# Patient Record
Sex: Female | Born: 1990 | Hispanic: No | Smoking: Former smoker
Health system: Southern US, Community
[De-identification: ages and names within clinical notes are randomized; demographics above are authoritative.]

## PROBLEM LIST (undated history)

## (undated) ENCOUNTER — Emergency Department (HOSPITAL_COMMUNITY): Payer: PRIVATE HEALTH INSURANCE

## (undated) ENCOUNTER — Emergency Department (HOSPITAL_COMMUNITY): Admission: EM | Payer: PRIVATE HEALTH INSURANCE

## (undated) ENCOUNTER — Ambulatory Visit (HOSPITAL_COMMUNITY): Admission: EM | Payer: Self-pay | Source: Home / Self Care

## (undated) DIAGNOSIS — F419 Anxiety disorder, unspecified: Secondary | ICD-10-CM

## (undated) DIAGNOSIS — F32A Depression, unspecified: Secondary | ICD-10-CM

## (undated) DIAGNOSIS — R011 Cardiac murmur, unspecified: Secondary | ICD-10-CM

## (undated) DIAGNOSIS — F4310 Post-traumatic stress disorder, unspecified: Secondary | ICD-10-CM

## (undated) DIAGNOSIS — J45909 Unspecified asthma, uncomplicated: Secondary | ICD-10-CM

## (undated) DIAGNOSIS — Z8759 Personal history of other complications of pregnancy, childbirth and the puerperium: Secondary | ICD-10-CM

## (undated) DIAGNOSIS — F3111 Bipolar disorder, current episode manic without psychotic features, mild: Secondary | ICD-10-CM

## (undated) DIAGNOSIS — J3089 Other allergic rhinitis: Secondary | ICD-10-CM

## (undated) HISTORY — DX: Other allergic rhinitis: J30.89

## (undated) HISTORY — DX: Cardiac murmur, unspecified: R01.1

## (undated) HISTORY — PX: ADENOIDECTOMY: SUR15

## (undated) HISTORY — PX: TONSILECTOMY/ADENOIDECTOMY WITH MYRINGOTOMY: SHX6125

## (undated) HISTORY — DX: Personal history of other complications of pregnancy, childbirth and the puerperium: Z87.59

---

## 2013-09-07 HISTORY — PX: FOOT SURGERY: SHX648

## 2016-09-21 ENCOUNTER — Encounter (HOSPITAL_COMMUNITY): Payer: Self-pay

## 2016-09-21 ENCOUNTER — Emergency Department (HOSPITAL_COMMUNITY)
Admission: EM | Admit: 2016-09-21 | Discharge: 2016-09-22 | Disposition: A | Discharge status: Home / Self Care | Payer: Self-pay | Attending: Emergency Medicine | Admitting: Emergency Medicine

## 2016-09-21 DIAGNOSIS — F17200 Nicotine dependence, unspecified, uncomplicated: Secondary | ICD-10-CM | POA: Insufficient documentation

## 2016-09-21 DIAGNOSIS — J45909 Unspecified asthma, uncomplicated: Secondary | ICD-10-CM | POA: Insufficient documentation

## 2016-09-21 DIAGNOSIS — M62830 Muscle spasm of back: Secondary | ICD-10-CM | POA: Insufficient documentation

## 2016-09-21 HISTORY — DX: Unspecified asthma, uncomplicated: J45.909

## 2016-09-21 NOTE — ED Triage Notes (Signed)
Pt complaining of bilateral flank pain and muscle spasms since yesterday. Pt states pain radiates down R leg. Pt denies any injury or trauma.

## 2016-09-22 MED ORDER — CYCLOBENZAPRINE HCL 10 MG PO TABS
10.0000 mg | ORAL_TABLET | Freq: Once | ORAL | Status: AC
  Administered 2016-09-22: 10 mg via ORAL
  Filled 2016-09-22: qty 1

## 2016-09-22 MED ORDER — CYCLOBENZAPRINE HCL 10 MG PO TABS: 10.0000 mg | ORAL_TABLET | Freq: Two times a day (BID) | ORAL | 0 refills | Status: DC | PRN

## 2016-09-22 MED ORDER — IBUPROFEN 600 MG PO TABS: 600.0000 mg | ORAL_TABLET | Freq: Four times a day (QID) | ORAL | 0 refills | Status: DC | PRN

## 2016-09-22 MED ORDER — HYDROCODONE-ACETAMINOPHEN 5-325 MG PO TABS: 1.0000 | ORAL_TABLET | ORAL | 0 refills | Status: DC | PRN

## 2016-09-22 MED ORDER — HYDROCODONE-ACETAMINOPHEN 5-325 MG PO TABS
1.0000 | ORAL_TABLET | Freq: Once | ORAL | Status: AC
  Administered 2016-09-22: 1 via ORAL
  Filled 2016-09-22: qty 1

## 2016-09-22 NOTE — ED Provider Notes (Signed)
[facility]-EMERGENCY DEPT Provider Note   CSN: [id number] Arrival date & time: 09/21/16  2212     History   Chief Complaint Chief Complaint  Patient presents with  . Back Pain    HPI Brandy Ruiz is a 26 y.o. female.  Patient with complaint of back pain x 2 days. No known injury or strain. No history of back pain. It is located in bilateral lower back without midline pain. It is constant and suddenly sharp and grabbing with movement. No abdominal pain, nausea or vomiting. No urinary symptoms or fever.    The history is provided by the patient. No language interpreter was used.  Back Pain   Pertinent negatives include no fever, no abdominal pain, no dysuria and no weakness.    Past Medical History:  Diagnosis Date  . Asthma     There are no active problems to display for this patient.   History reviewed. No pertinent surgical history.  OB History    No data available       Home Medications    Prior to Admission medications   Not on File    Family History History reviewed. No pertinent family history.  Social History Social History  Substance Use Topics  . Smoking status: Current Every Day Smoker  . Smokeless tobacco: Never Used  . Alcohol use Yes     Allergies   Other and Penicillins   Review of Systems Review of Systems  Constitutional: Negative for chills and fever.  Gastrointestinal: Negative for abdominal pain and nausea.  Genitourinary: Negative for dysuria.  Musculoskeletal: Positive for back pain.  Neurological: Negative for weakness.     Physical Exam Updated Vital Signs BP 130/85   Pulse 85   Temp 97.9 F (36.6 C) (Oral)   Resp 16   LMP 09/14/2016 (Approximate)   SpO2 100%   Physical Exam  Constitutional: She is oriented to person, place, and time. She appears well-developed and well-nourished. No distress.  Neck: Normal range of motion.  Pulmonary/Chest: Effort normal.  Abdominal: There is no tenderness.    Musculoskeletal:  Bilateral paralumbar tenderness without palpable spasm. No midline tenderness. She is ambulatory and fully weight bearing.   Neurological: She is alert and oriented to person, place, and time.  Reflexes equal in lower extremities.   Skin: Skin is warm and dry. No erythema.     ED Treatments / Results  Labs (all labs ordered are listed, but only abnormal results are displayed) Labs Reviewed - No data to display  EKG  EKG Interpretation None       Radiology No results found.  Procedures Procedures (including critical care time)  Medications Ordered in ED Medications  HYDROcodone-acetaminophen (NORCO/VICODIN) 5-325 MG per tablet 1 tablet (not administered)  cyclobenzaprine (FLEXERIL) tablet 10 mg (not administered)     Initial Impression / Assessment and Plan / ED Course  I have reviewed the triage vital signs and the nursing notes.  Pertinent labs & imaging results that were available during my care of the patient were reviewed by me and considered in my medical decision making (see chart for details).  Clinical Course     Complains of bilateral back pain c/w muscular spasm. Will treat with rest, ibuprofen, flexeril. Return precautions discussed.   Final Clinical Impressions(s) / ED Diagnoses   Final diagnoses:  None   1. Muscle spasm  New Prescriptions New Prescriptions   No medications on file     Elpidio AnisShari Yassen [place]ett, PA-C 09/22/16 [id number]  Zadie Rhine, MD 09/22/16 [phone]

## 2017-06-21 ENCOUNTER — Emergency Department (HOSPITAL_COMMUNITY)
Admission: EM | Admit: 2017-06-21 | Discharge: 2017-06-21 | Discharge status: Against Medical Advice | Payer: Self-pay | Attending: Emergency Medicine | Admitting: Emergency Medicine

## 2017-06-21 DIAGNOSIS — Y281XXA Contact with knife, undetermined intent, initial encounter: Secondary | ICD-10-CM | POA: Insufficient documentation

## 2017-06-21 DIAGNOSIS — S41112A Laceration without foreign body of left upper arm, initial encounter: Secondary | ICD-10-CM

## 2017-06-21 DIAGNOSIS — Y929 Unspecified place or not applicable: Secondary | ICD-10-CM | POA: Insufficient documentation

## 2017-06-21 DIAGNOSIS — Y9389 Activity, other specified: Secondary | ICD-10-CM | POA: Insufficient documentation

## 2017-06-21 DIAGNOSIS — Y999 Unspecified external cause status: Secondary | ICD-10-CM | POA: Insufficient documentation

## 2017-06-21 DIAGNOSIS — S51812A Laceration without foreign body of left forearm, initial encounter: Secondary | ICD-10-CM | POA: Insufficient documentation

## 2017-06-21 DIAGNOSIS — F17200 Nicotine dependence, unspecified, uncomplicated: Secondary | ICD-10-CM | POA: Insufficient documentation

## 2017-06-21 DIAGNOSIS — Z5329 Procedure and treatment not carried out because of patient's decision for other reasons: Secondary | ICD-10-CM | POA: Insufficient documentation

## 2017-06-21 DIAGNOSIS — J45909 Unspecified asthma, uncomplicated: Secondary | ICD-10-CM | POA: Insufficient documentation

## 2017-06-21 LAB — COMPREHENSIVE METABOLIC PANEL
ALT: 27 U/L (ref 14–54)
AST: 33 U/L (ref 15–41)
Albumin: 4.7 g/dL (ref 3.5–5.0)
Alkaline Phosphatase: 68 U/L (ref 38–126)
Anion gap: 12 (ref 5–15)
BUN: 8 mg/dL (ref 6–20)
CHLORIDE: 111 mmol/L (ref 101–111)
CO2: 21 mmol/L — ABNORMAL LOW (ref 22–32)
Calcium: 9 mg/dL (ref 8.9–10.3)
Creatinine, Ser: 0.69 mg/dL (ref 0.44–1.00)
Glucose, Bld: 96 mg/dL (ref 65–99)
POTASSIUM: 3.7 mmol/L (ref 3.5–5.1)
Sodium: 144 mmol/L (ref 135–145)
Total Bilirubin: 0.3 mg/dL (ref 0.3–1.2)
Total Protein: 8.1 g/dL (ref 6.5–8.1)

## 2017-06-21 MED ORDER — LIDOCAINE-EPINEPHRINE (PF) 2 %-1:200000 IJ SOLN
INTRAMUSCULAR | Status: AC
  Administered 2017-06-21: 07:00:00
  Filled 2017-06-21: qty 20

## 2017-06-21 NOTE — ED Provider Notes (Signed)
[facility]-EMERGENCY DEPT Provider Note   CSN: [id number] Arrival date & time: 06/21/17  0610     History   Chief Complaint Chief Complaint  Patient presents with  . Suicidal  . Extremity Laceration    HPI Brandy Ruiz is a 26 y.o. female.  HPI   Evaluation of extremity laceration. Patient is accompanied by her fiance, [place]. They report about an hour before arrival, they were drinking alcohol, patient became upset about her current living situation, decreased finances and cut her left forearm with a kitchen knife. She denies any suicidal ideations. No auditory or visual hallucinations. She denies any other illicit drug use.no fevers, chills, chest pain or shortness of breath, abdominal pain, numbness or weakness. Nothing tried to improve symptoms. Nothing makes problem better or worse.ast tetanus unknown.  Past Medical History:  Diagnosis Date  . Asthma     There are no active problems to display for this patient.   No past surgical history on file.  OB History    No data available       Home Medications    Prior to Admission medications   Medication Sig Start Date End Date Taking? Authorizing Provider  cyclobenzaprine (FLEXERIL) 10 MG tablet Take 1 tablet (10 mg total) by mouth 2 (two) times daily as needed for muscle spasms. 09/22/16   Elpidio Anis, PA-C  HYDROcodone-acetaminophen (NORCO/VICODIN) 5-325 MG tablet Take 1 tablet by mouth every 4 (four) hours as needed. 09/22/16   Elpidio Anis, PA-C  ibuprofen (ADVIL,MOTRIN) 600 MG tablet Take 1 tablet (600 mg total) by mouth every 6 (six) hours as needed. 09/22/16   Elpidio Anis, PA-C    Family History No family history on file.  Social History Social History  Substance Use Topics  . Smoking status: Current Every Day Smoker  . Smokeless tobacco: Never Used  . Alcohol use Yes     Allergies   Amoxicillin; Other; and Penicillins   Review of Systems Review of Systems  see HPI  Physical Exam Updated  Vital Signs BP (!) 142/93 (BP Location: Left Arm)   Pulse 84   Temp 98.1 F (36.7 C) (Oral)   Resp 15   Ht  (1.651 m)   Wt 83.9 kg (185 lb)   SpO2 99%   BMI 30.79 kg/m   Physical Exam  Constitutional: She appears well-developed. No distress.  Awake, alert and nontoxic in appearance  HENT:  Head: Normocephalic and atraumatic.  Right Ear: External ear normal.  Left Ear: External ear normal.  Mouth/Throat: Oropharynx is clear and moist.  Eyes: Pupils are equal, round, and reactive to light. Conjunctivae and EOM are normal.  Neck: Normal range of motion. No JVD present.  Cardiovascular: Normal rate, regular rhythm and normal heart sounds.   Pulmonary/Chest: Effort normal and breath sounds normal. No stridor.  Abdominal: Soft. There is no tenderness.  Musculoskeletal: Normal range of motion.  Neurological:  Awake, alert, cooperative and aware of situation; motor strength bilaterally; sensation normal to light touch bilaterally; no facial asymmetry; tongue midline; major cranial nerves appear intact;  baseline gait without new ataxia.  Skin: No rash noted. She is not diaphoretic.  Linear laceration over the palmar aspect of  Mid left forearm, roughly 4 cm in length. Minimal adipose tissue exposed. Hemostasis achieved prior to arrival. Sensation intact to light touch distal to injury. Able to actively flex and extend all digits against resistance. Distal pulses intact with brisk cap refill.  Psychiatric: She has a normal mood and affect.  Her behavior is normal. Thought content normal.  Nursing note and vitals reviewed.  LACERATION REPAIR Performed by: Sharlene Motts Authorized by: Sharlene Motts Consent: Verbal consent obtained. Risks and benefits: risks, benefits and alternatives were discussed Consent given by: patient Patient identity confirmed: provided demographic data Prepped and Draped in normal sterile fashion Wound explored  Laceration Location: left  forearm  Laceration Length: 4cm  No Foreign Bodies seen or palpated  Anesthesia: local infiltration  Local anesthetic: lidocaine 2% with epinephrine  Anesthetic total: 5 ml  Irrigation method: syringe Amount of cleaning: standard  Skin closure: Ethilon 4-0, running suture  Number of sutures: 1  Technique: running  Patient tolerance: Patient tolerated the procedure well with no immediate complications.   ED Treatments / Results  Labs (all labs ordered are listed, but only abnormal results are displayed) Labs Reviewed  COMPREHENSIVE METABOLIC PANEL - Abnormal; Notable for the following:       Result Value   CO2 21 (*)    All other components within normal limits  ETHANOL  SALICYLATE LEVEL  ACETAMINOPHEN LEVEL  RAPID URINE DRUG SCREEN, HOSP PERFORMED  CBC  I-STAT BETA HCG BLOOD, ED ([facility], [facility], [facility] ONLY)    EKG  EKG Interpretation None       Radiology No results found.  Procedures Procedures (including critical care time)  Medications Ordered in ED Medications  lidocaine-EPINEPHrine (XYLOCAINE W/EPI) 2 %-1:200000 (PF) injection (  Given by Other 06/21/17 1610)     Initial Impression / Assessment and Plan / ED Course  I have reviewed the triage vital signs and the nursing notes.  Pertinent labs & imaging results that were available during my care of the patient were reviewed by me and considered in my medical decision making (see chart for details).     Patient is clinically sober at this time. She refuses any lab work , Tdap update or psych evaluation. Is going home with fianc, Milan. Discussed with her this would require her leaving against medical advise. She verbalizes understanding as does her fianc, they understand they may return to emergency department at any time for reevaluation. She agrees to return in 7 days to have sutures removed. She is overall well-appearing, no suicidal or homicidal ideations, hallucinations. I believe she is mentally  capable of understanding her current situation and accepting responsibility for her decision.  Final Clinical Impressions(s) / ED Diagnoses   Final diagnoses:  Laceration of left upper extremity, initial encounter    New Prescriptions Discharge Medication List as of 06/21/2017  7:33 AM       Joycie Peek, PA-C 06/21/17 0930    Long, Arlyss Repress, MD 06/22/17 [phone]

## 2017-06-21 NOTE — ED Notes (Signed)
Patient left AMA and did not want any instructions or information on follow up or returning if needed.

## 2017-06-21 NOTE — ED Notes (Addendum)
This writer attempt to collect labs PT became upset state she does not know why she is here. PT request to leave state she is not SI. She does not need to be here. When ask about what test will be running. RN info PT of all test and reason for each test. PT unhappy currently not IVC PA have been made awre

## 2017-06-21 NOTE — ED Triage Notes (Signed)
Patient arrives by EMS with complaints of suicide attempt by cutting left forearm/wrist area with a knife. Friends called 911. EMS states she has been drinking tonight/stress in life-stated to EMS that she did not want to kill herself, she just wanted to hurt herself. Laceration-3 cm lac with soft tissue showing-bleeding on scene and fire bandaged patient.

## 2017-06-21 NOTE — ED Notes (Signed)
Bed: RESB Expected date:  Expected time:  Means of arrival:  Comments: EMS SI/cut wrists

## 2017-06-21 NOTE — ED Notes (Signed)
Patient becoming more verbally abusive and stating that she wants to go home.  Consulted [employer] on IVC vs AMA.  Romeo Apple stated that he was not going to IVC her and the patient left AMA.  Patient was alert and oriented x4 and showing no signs of distress.

## 2017-06-27 ENCOUNTER — Encounter (HOSPITAL_COMMUNITY): Payer: Self-pay

## 2017-06-27 ENCOUNTER — Emergency Department (HOSPITAL_COMMUNITY)
Admission: EM | Admit: 2017-06-27 | Discharge: 2017-06-27 | Disposition: A | Discharge status: Home / Self Care | Payer: Self-pay | Attending: Emergency Medicine | Admitting: Emergency Medicine

## 2017-06-27 DIAGNOSIS — F17210 Nicotine dependence, cigarettes, uncomplicated: Secondary | ICD-10-CM | POA: Insufficient documentation

## 2017-06-27 DIAGNOSIS — Z4802 Encounter for removal of sutures: Secondary | ICD-10-CM | POA: Insufficient documentation

## 2017-06-27 DIAGNOSIS — Z79899 Other long term (current) drug therapy: Secondary | ICD-10-CM | POA: Insufficient documentation

## 2017-06-27 DIAGNOSIS — J45909 Unspecified asthma, uncomplicated: Secondary | ICD-10-CM | POA: Insufficient documentation

## 2017-06-27 NOTE — ED Triage Notes (Signed)
Pt requesting suture removal on her L forearm. Area does not appear infected. Denies bleeding or drainage. A&Ox4.

## 2017-06-27 NOTE — ED Provider Notes (Signed)
  [facility]-EMERGENCY DEPT Provider Note   CSN: [id number] Arrival date & time: 06/27/17  1747     History   Chief Complaint Chief Complaint  Patient presents with  . Suture / Staple Removal    HPI Brandy Ruiz is a 26 y.o. female who presents to the ED for suture removal. The sutures have been in place one week. The patient denies any problems with the suture site.   HPI  Past Medical History:  Diagnosis Date  . Asthma     There are no active problems to display for this patient.   History reviewed. No pertinent surgical history.  OB History    No data available       Home Medications    Prior to Admission medications   Medication Sig Start Date End Date Taking? Authorizing Provider  cyclobenzaprine (FLEXERIL) 10 MG tablet Take 1 tablet (10 mg total) by mouth 2 (two) times daily as needed for muscle spasms. 09/22/16   Elpidio AnisUpstill, Shari, PA-C  HYDROcodone-acetaminophen (NORCO/VICODIN) 5-325 MG tablet Take 1 tablet by mouth every 4 (four) hours as needed. 09/22/16   Elpidio AnisUpstill, Shari, PA-C  ibuprofen (ADVIL,MOTRIN) 600 MG tablet Take 1 tablet (600 mg total) by mouth every 6 (six) hours as needed. 09/22/16   Elpidio AnisUpstill, Shari, PA-C    Family History History reviewed. No pertinent family history.  Social History Social History  Substance Use Topics  . Smoking status: Current Every Day Smoker  . Smokeless tobacco: Never Used  . Alcohol use Yes     Allergies   Amoxicillin; Other; and Penicillins   Review of Systems Review of Systems  Musculoskeletal: Negative for joint swelling.  Skin: Positive for wound.     Physical Exam Updated Vital Signs BP (!) 144/92 (BP Location: Right Arm)   Pulse 100   Temp 98.1 F (36.7 C) (Oral)   Resp 18   SpO2 100%   Physical Exam  Constitutional: She appears well-developed and well-nourished. No distress.  HENT:  Head: Normocephalic.  Eyes: EOM are normal.  Neck: Neck supple.  Cardiovascular:  Normal rate.   Pulmonary/Chest: Effort normal.  Musculoskeletal: Normal range of motion.  Sutures in place left forearm no erythema, no drainage, no signs of infection.   Neurological: She is alert.  Skin: Skin is warm and dry.  Nursing note and vitals reviewed.    ED Treatments / Results  Labs (all labs ordered are listed, but only abnormal results are displayed) Labs Reviewed - No data to display   Radiology No results found.  Procedures Procedures (including critical care time)  Medications Ordered in ED Medications - No data to display  Sutures removed by RN.  Initial Impression / Assessment and Plan / ED Course  I have reviewed the triage vital signs and the nursing notes. 26 y.o. female here for suture removal without signs of infection. No other complaints.   Final Clinical Impressions(s) / ED Diagnoses   Final diagnoses:  Visit for suture removal    New Prescriptions New Prescriptions   No medications on file     Janne Napoleoneese, Hope M, NP 06/27/17 1849    Rolland PorterJames, Mark, MD 07/16/17 2252

## 2018-01-10 ENCOUNTER — Encounter (HOSPITAL_COMMUNITY): Payer: Self-pay | Admitting: *Deleted

## 2018-01-10 ENCOUNTER — Emergency Department (HOSPITAL_COMMUNITY)
Admission: EM | Admit: 2018-01-10 | Discharge: 2018-01-10 | Disposition: A | Discharge status: Home / Self Care | Payer: Self-pay | Attending: Emergency Medicine | Admitting: Emergency Medicine

## 2018-01-10 DIAGNOSIS — J4521 Mild intermittent asthma with (acute) exacerbation: Secondary | ICD-10-CM | POA: Insufficient documentation

## 2018-01-10 DIAGNOSIS — F17210 Nicotine dependence, cigarettes, uncomplicated: Secondary | ICD-10-CM | POA: Insufficient documentation

## 2018-01-10 DIAGNOSIS — Z79899 Other long term (current) drug therapy: Secondary | ICD-10-CM | POA: Insufficient documentation

## 2018-01-10 MED ORDER — ALBUTEROL SULFATE (2.5 MG/3ML) 0.083% IN NEBU
2.5000 mg | INHALATION_SOLUTION | Freq: Once | RESPIRATORY_TRACT | Status: AC
  Administered 2018-01-10: 2.5 mg via RESPIRATORY_TRACT
  Filled 2018-01-10: qty 3

## 2018-01-10 MED ORDER — PREDNISONE 20 MG PO TABS: ORAL_TABLET | ORAL | 0 refills | Status: DC

## 2018-01-10 MED ORDER — ALBUTEROL SULFATE (2.5 MG/3ML) 0.083% IN NEBU
5.0000 mg | INHALATION_SOLUTION | Freq: Once | RESPIRATORY_TRACT | Status: AC
  Administered 2018-01-10: 5 mg via RESPIRATORY_TRACT
  Filled 2018-01-10: qty 6

## 2018-01-10 MED ORDER — LORATADINE 10 MG PO TABS
10.0000 mg | ORAL_TABLET | Freq: Once | ORAL | Status: AC
  Administered 2018-01-10: 10 mg via ORAL
  Filled 2018-01-10: qty 1

## 2018-01-10 MED ORDER — PREDNISONE 20 MG PO TABS
60.0000 mg | ORAL_TABLET | Freq: Once | ORAL | Status: AC
  Administered 2018-01-10: 60 mg via ORAL
  Filled 2018-01-10: qty 3

## 2018-01-10 MED ORDER — BENZONATATE 100 MG PO CAPS: 100.0000 mg | ORAL_CAPSULE | Freq: Three times a day (TID) | ORAL | 0 refills | Status: DC

## 2018-01-10 MED ORDER — LORATADINE 10 MG PO TABS: 10.0000 mg | ORAL_TABLET | Freq: Every day | ORAL | 0 refills | Status: DC

## 2018-01-10 NOTE — ED Provider Notes (Signed)
[facility]-EMERGENCY DEPT Provider Note   CSN: [id number] Arrival date & time: 01/10/18  0128     History   Chief Complaint Chief Complaint  Patient presents with  . Wheezing    HPI Brandy Ruiz is a 27 y.o. female.  The history is provided by the patient.  Wheezing   This is a recurrent problem. The current episode started 2 days ago. The problem occurs constantly. The problem has not changed since onset.Associated symptoms include cough. Pertinent negatives include no chest pain, no fever, no abdominal pain, no vomiting, no diarrhea, no dysuria, no coryza, no ear pain, no headaches, no rhinorrhea, no sore throat, no swollen glands, no neck pain, no hemoptysis, no sputum production and no rash. There was no precipitant for this problem. She has tried nothing for the symptoms. The treatment provided no relief. She has had no prior hospitalizations. She has had no prior ICU admissions. Her past medical history is significant for asthma.    Past Medical History:  Diagnosis Date  . Asthma     There are no active problems to display for this patient.   History reviewed. No pertinent surgical history.   OB History   None      Home Medications    Prior to Admission medications   Medication Sig Start Date End Date Taking? Authorizing Provider  cyclobenzaprine (FLEXERIL) 10 MG tablet Take 1 tablet (10 mg total) by mouth 2 (two) times daily as needed for muscle spasms. 09/22/16   Elpidio Anis, PA-C  HYDROcodone-acetaminophen (NORCO/VICODIN) 5-325 MG tablet Take 1 tablet by mouth every 4 (four) hours as needed. 09/22/16   Elpidio Anis, PA-C  ibuprofen (ADVIL,MOTRIN) 600 MG tablet Take 1 tablet (600 mg total) by mouth every 6 (six) hours as needed. 09/22/16   Elpidio Anis, PA-C    Family History No family history on file.  Social History Social History   Tobacco Use  . Smoking status: Current Every Day Smoker  . Smokeless tobacco: Never Used    Substance Use Topics  . Alcohol use: Yes  . Drug use: No     Allergies   Amoxicillin; Other; and Penicillins   Review of Systems Review of Systems  Constitutional: Negative for fever.  HENT: Negative for ear pain, rhinorrhea and sore throat.   Respiratory: Positive for cough and wheezing. Negative for hemoptysis, sputum production and shortness of breath.   Cardiovascular: Negative for chest pain, palpitations and leg swelling.  Gastrointestinal: Negative for abdominal pain, diarrhea and vomiting.  Genitourinary: Negative for dysuria.  Musculoskeletal: Negative for neck pain.  Skin: Negative for rash.  Neurological: Negative for headaches.  All other systems reviewed and are negative.    Physical Exam Updated Vital Signs BP 138/75 (BP Location: Left Arm)   Pulse 97   Temp 98.9 F (37.2 C) (Oral)   Resp 18   Ht  (1.651 m)   Wt 83.9 kg (185 lb)   SpO2 98%   BMI 30.79 kg/m   Physical Exam  Constitutional: She is oriented to person, place, and time. She appears well-developed and well-nourished. No distress.  HENT:  Head: Normocephalic and atraumatic.  Mouth/Throat: No oropharyngeal exudate.  Eyes: Pupils are equal, round, and reactive to light. Conjunctivae are normal.  Neck: Normal range of motion. Neck supple.  Cardiovascular: Normal rate, regular rhythm, normal heart sounds and intact distal pulses.  Pulmonary/Chest: No stridor. No respiratory distress. She has wheezes. She has no rales.  Abdominal: Soft. Bowel sounds  are normal. She exhibits no mass. There is no tenderness. There is no rebound and no guarding.  Musculoskeletal: Normal range of motion. She exhibits no edema or tenderness.  Neurological: She is alert and oriented to person, place, and time. She displays normal reflexes.  Skin: Skin is warm and dry. Capillary refill takes less than 2 seconds.  Psychiatric: She has a normal mood and affect.  Nursing note and vitals reviewed.    ED  Treatments / Results  Labs (all labs ordered are listed, but only abnormal results are displayed) Labs Reviewed - No data to display  EKG None  Radiology No results found.  Procedures Procedures (including critical care time)  Medications Ordered in ED Medications  albuterol (PROVENTIL) (2.5 MG/3ML) 0.083% nebulizer solution 5 mg (5 mg Nebulization Given 01/10/18 0142)  albuterol (PROVENTIL) (2.5 MG/3ML) 0.083% nebulizer solution 2.5 mg (2.5 mg Nebulization Given 01/10/18 0344)  loratadine (CLARITIN) tablet 10 mg (10 mg Oral Given 01/10/18 0344)  predniSONE (DELTASONE) tablet 60 mg (60 mg Oral Given 01/10/18 0344)   Improved post medication   Final Clinical Impressions(s) / ED Diagnoses   Return for weakness, numbness, changes in vision or speech, fevers >100.4 unrelieved by medication, shortness of breath, intractable vomiting, or diarrhea, abdominal pain, Inability to tolerate liquids or food, cough, altered mental status or any concerns. No signs of systemic illness or infection. The patient is nontoxic-appearing on exam and vital signs are within normal limits.   I have reviewed the triage vital signs and the nursing notes. Pertinent labs &imaging results that were available during my care of the patient were reviewed by me and considered in my medical decision making (see chart for details).  After history, exam, and medical workup I feel the patient has been appropriately medically screened and is safe for discharge home. Pertinent diagnoses were discussed with the patient. Patient was given return precautions.    Yolunda Kloos, MD 01/10/18 0981

## 2018-01-10 NOTE — ED Triage Notes (Signed)
Pt stated "My neb or inhaler is not working.  I'm wheezing.  I used my neb last @ 2230.  Now I'm coughing and can't quit."

## 2018-01-10 NOTE — ED Notes (Signed)
Pt placed on continuous pulse ox

## 2019-08-29 DIAGNOSIS — M9905 Segmental and somatic dysfunction of pelvic region: Secondary | ICD-10-CM | POA: Diagnosis not present

## 2019-08-29 DIAGNOSIS — M9903 Segmental and somatic dysfunction of lumbar region: Secondary | ICD-10-CM | POA: Diagnosis not present

## 2019-08-29 DIAGNOSIS — M62830 Muscle spasm of back: Secondary | ICD-10-CM | POA: Diagnosis not present

## 2019-08-29 DIAGNOSIS — M9902 Segmental and somatic dysfunction of thoracic region: Secondary | ICD-10-CM | POA: Diagnosis not present

## 2019-09-25 DIAGNOSIS — M9902 Segmental and somatic dysfunction of thoracic region: Secondary | ICD-10-CM | POA: Diagnosis not present

## 2019-09-25 DIAGNOSIS — M9905 Segmental and somatic dysfunction of pelvic region: Secondary | ICD-10-CM | POA: Diagnosis not present

## 2019-09-25 DIAGNOSIS — M62830 Muscle spasm of back: Secondary | ICD-10-CM | POA: Diagnosis not present

## 2019-09-25 DIAGNOSIS — M9903 Segmental and somatic dysfunction of lumbar region: Secondary | ICD-10-CM | POA: Diagnosis not present

## 2019-09-28 DIAGNOSIS — M9902 Segmental and somatic dysfunction of thoracic region: Secondary | ICD-10-CM | POA: Diagnosis not present

## 2019-09-28 DIAGNOSIS — M62830 Muscle spasm of back: Secondary | ICD-10-CM | POA: Diagnosis not present

## 2019-09-28 DIAGNOSIS — M9903 Segmental and somatic dysfunction of lumbar region: Secondary | ICD-10-CM | POA: Diagnosis not present

## 2019-09-28 DIAGNOSIS — M9905 Segmental and somatic dysfunction of pelvic region: Secondary | ICD-10-CM | POA: Diagnosis not present

## 2019-10-02 DIAGNOSIS — M62830 Muscle spasm of back: Secondary | ICD-10-CM | POA: Diagnosis not present

## 2019-10-02 DIAGNOSIS — M9903 Segmental and somatic dysfunction of lumbar region: Secondary | ICD-10-CM | POA: Diagnosis not present

## 2019-10-02 DIAGNOSIS — M9902 Segmental and somatic dysfunction of thoracic region: Secondary | ICD-10-CM | POA: Diagnosis not present

## 2019-10-02 DIAGNOSIS — M9905 Segmental and somatic dysfunction of pelvic region: Secondary | ICD-10-CM | POA: Diagnosis not present

## 2019-10-12 DIAGNOSIS — Z91013 Allergy to seafood: Secondary | ICD-10-CM | POA: Diagnosis not present

## 2019-10-12 DIAGNOSIS — J301 Allergic rhinitis due to pollen: Secondary | ICD-10-CM | POA: Diagnosis not present

## 2019-10-12 DIAGNOSIS — J45909 Unspecified asthma, uncomplicated: Secondary | ICD-10-CM | POA: Diagnosis not present

## 2019-10-12 DIAGNOSIS — J309 Allergic rhinitis, unspecified: Secondary | ICD-10-CM | POA: Diagnosis not present

## 2019-10-19 DIAGNOSIS — F314 Bipolar disorder, current episode depressed, severe, without psychotic features: Secondary | ICD-10-CM | POA: Diagnosis not present

## 2019-11-09 DIAGNOSIS — J309 Allergic rhinitis, unspecified: Secondary | ICD-10-CM | POA: Diagnosis not present

## 2019-11-09 DIAGNOSIS — Z91018 Allergy to other foods: Secondary | ICD-10-CM | POA: Diagnosis not present

## 2019-11-09 DIAGNOSIS — J45909 Unspecified asthma, uncomplicated: Secondary | ICD-10-CM | POA: Diagnosis not present

## 2019-11-13 DIAGNOSIS — Z0184 Encounter for antibody response examination: Secondary | ICD-10-CM | POA: Diagnosis not present

## 2019-11-13 DIAGNOSIS — Z0000 Encounter for general adult medical examination without abnormal findings: Secondary | ICD-10-CM | POA: Diagnosis not present

## 2019-11-16 DIAGNOSIS — G4700 Insomnia, unspecified: Secondary | ICD-10-CM | POA: Diagnosis not present

## 2019-11-16 DIAGNOSIS — Z6839 Body mass index (BMI) 39.0-39.9, adult: Secondary | ICD-10-CM | POA: Diagnosis not present

## 2019-11-16 DIAGNOSIS — Z23 Encounter for immunization: Secondary | ICD-10-CM | POA: Diagnosis not present

## 2019-11-16 DIAGNOSIS — Z0000 Encounter for general adult medical examination without abnormal findings: Secondary | ICD-10-CM | POA: Diagnosis not present

## 2019-11-16 DIAGNOSIS — N926 Irregular menstruation, unspecified: Secondary | ICD-10-CM | POA: Diagnosis not present

## 2019-11-16 DIAGNOSIS — Z01419 Encounter for gynecological examination (general) (routine) without abnormal findings: Secondary | ICD-10-CM | POA: Diagnosis not present

## 2019-11-16 DIAGNOSIS — R5383 Other fatigue: Secondary | ICD-10-CM | POA: Diagnosis not present

## 2019-11-16 DIAGNOSIS — F314 Bipolar disorder, current episode depressed, severe, without psychotic features: Secondary | ICD-10-CM | POA: Diagnosis not present

## 2019-11-16 DIAGNOSIS — R946 Abnormal results of thyroid function studies: Secondary | ICD-10-CM | POA: Diagnosis not present

## 2019-12-12 DIAGNOSIS — F314 Bipolar disorder, current episode depressed, severe, without psychotic features: Secondary | ICD-10-CM | POA: Diagnosis not present

## 2019-12-19 DIAGNOSIS — R946 Abnormal results of thyroid function studies: Secondary | ICD-10-CM | POA: Diagnosis not present

## 2019-12-20 DIAGNOSIS — E039 Hypothyroidism, unspecified: Secondary | ICD-10-CM | POA: Diagnosis not present

## 2019-12-20 DIAGNOSIS — N926 Irregular menstruation, unspecified: Secondary | ICD-10-CM | POA: Diagnosis not present

## 2019-12-20 DIAGNOSIS — R946 Abnormal results of thyroid function studies: Secondary | ICD-10-CM | POA: Diagnosis not present

## 2019-12-20 DIAGNOSIS — R5383 Other fatigue: Secondary | ICD-10-CM | POA: Diagnosis not present

## 2020-01-09 DIAGNOSIS — F314 Bipolar disorder, current episode depressed, severe, without psychotic features: Secondary | ICD-10-CM | POA: Diagnosis not present

## 2020-02-01 DIAGNOSIS — F902 Attention-deficit hyperactivity disorder, combined type: Secondary | ICD-10-CM | POA: Diagnosis not present

## 2020-02-15 DIAGNOSIS — F902 Attention-deficit hyperactivity disorder, combined type: Secondary | ICD-10-CM | POA: Diagnosis not present

## 2020-02-21 DIAGNOSIS — M9902 Segmental and somatic dysfunction of thoracic region: Secondary | ICD-10-CM | POA: Diagnosis not present

## 2020-02-21 DIAGNOSIS — M9903 Segmental and somatic dysfunction of lumbar region: Secondary | ICD-10-CM | POA: Diagnosis not present

## 2020-02-21 DIAGNOSIS — M9905 Segmental and somatic dysfunction of pelvic region: Secondary | ICD-10-CM | POA: Diagnosis not present

## 2020-02-21 DIAGNOSIS — M62830 Muscle spasm of back: Secondary | ICD-10-CM | POA: Diagnosis not present

## 2020-02-29 DIAGNOSIS — F902 Attention-deficit hyperactivity disorder, combined type: Secondary | ICD-10-CM | POA: Diagnosis not present

## 2020-03-05 DIAGNOSIS — F902 Attention-deficit hyperactivity disorder, combined type: Secondary | ICD-10-CM | POA: Diagnosis not present

## 2020-03-07 DIAGNOSIS — F314 Bipolar disorder, current episode depressed, severe, without psychotic features: Secondary | ICD-10-CM | POA: Diagnosis not present

## 2020-03-07 DIAGNOSIS — F902 Attention-deficit hyperactivity disorder, combined type: Secondary | ICD-10-CM | POA: Diagnosis not present

## 2020-03-20 DIAGNOSIS — F902 Attention-deficit hyperactivity disorder, combined type: Secondary | ICD-10-CM | POA: Diagnosis not present

## 2020-04-24 DIAGNOSIS — F902 Attention-deficit hyperactivity disorder, combined type: Secondary | ICD-10-CM | POA: Diagnosis not present

## 2020-05-08 DIAGNOSIS — F902 Attention-deficit hyperactivity disorder, combined type: Secondary | ICD-10-CM | POA: Diagnosis not present

## 2020-05-23 DIAGNOSIS — Z23 Encounter for immunization: Secondary | ICD-10-CM | POA: Diagnosis not present

## 2020-05-31 DIAGNOSIS — E039 Hypothyroidism, unspecified: Secondary | ICD-10-CM | POA: Diagnosis not present

## 2020-06-04 DIAGNOSIS — F331 Major depressive disorder, recurrent, moderate: Secondary | ICD-10-CM | POA: Diagnosis not present

## 2020-06-04 DIAGNOSIS — F411 Generalized anxiety disorder: Secondary | ICD-10-CM | POA: Diagnosis not present

## 2020-06-04 DIAGNOSIS — E039 Hypothyroidism, unspecified: Secondary | ICD-10-CM | POA: Diagnosis not present

## 2020-06-04 DIAGNOSIS — J4540 Moderate persistent asthma, uncomplicated: Secondary | ICD-10-CM | POA: Diagnosis not present

## 2020-06-21 DIAGNOSIS — N926 Irregular menstruation, unspecified: Secondary | ICD-10-CM | POA: Diagnosis not present

## 2020-08-06 ENCOUNTER — Inpatient Hospital Stay (HOSPITAL_COMMUNITY): Payer: Commercial Managed Care - PPO

## 2020-08-06 ENCOUNTER — Encounter (HOSPITAL_COMMUNITY): Payer: Self-pay | Admitting: Obstetrics and Gynecology

## 2020-08-06 ENCOUNTER — Inpatient Hospital Stay (HOSPITAL_COMMUNITY)
Admission: AD | Admit: 2020-08-06 | Discharge: 2020-08-06 | Disposition: A | Discharge status: Home / Self Care | Payer: Commercial Managed Care - PPO | Attending: Obstetrics & Gynecology | Admitting: Obstetrics & Gynecology

## 2020-08-06 DIAGNOSIS — O4690 Antepartum hemorrhage, unspecified, unspecified trimester: Secondary | ICD-10-CM

## 2020-08-06 DIAGNOSIS — Z6791 Unspecified blood type, Rh negative: Secondary | ICD-10-CM | POA: Insufficient documentation

## 2020-08-06 DIAGNOSIS — R109 Unspecified abdominal pain: Secondary | ICD-10-CM | POA: Insufficient documentation

## 2020-08-06 DIAGNOSIS — F17200 Nicotine dependence, unspecified, uncomplicated: Secondary | ICD-10-CM | POA: Diagnosis not present

## 2020-08-06 DIAGNOSIS — O039 Complete or unspecified spontaneous abortion without complication: Secondary | ICD-10-CM | POA: Insufficient documentation

## 2020-08-06 DIAGNOSIS — Z3A12 12 weeks gestation of pregnancy: Secondary | ICD-10-CM | POA: Diagnosis not present

## 2020-08-06 DIAGNOSIS — O26891 Other specified pregnancy related conditions, first trimester: Secondary | ICD-10-CM | POA: Insufficient documentation

## 2020-08-06 LAB — COMPREHENSIVE METABOLIC PANEL
ALT: 19 U/L (ref 0–44)
AST: 20 U/L (ref 15–41)
Albumin: 4.1 g/dL (ref 3.5–5.0)
Alkaline Phosphatase: 50 U/L (ref 38–126)
Anion gap: 13 (ref 5–15)
BUN: 9 mg/dL (ref 6–20)
CO2: 19 mmol/L — ABNORMAL LOW (ref 22–32)
Calcium: 9.2 mg/dL (ref 8.9–10.3)
Chloride: 104 mmol/L (ref 98–111)
Creatinine, Ser: 0.75 mg/dL (ref 0.44–1.00)
GFR, Estimated: >60 mL/min (ref >60)
Glucose, Bld: 122 mg/dL — ABNORMAL HIGH (ref 70–99)
Potassium: 3.6 mmol/L (ref 3.5–5.1)
Sodium: 136 mmol/L (ref 135–145)
Total Bilirubin: 0.4 mg/dL (ref 0.3–1.2)
Total Protein: 7.2 g/dL (ref 6.5–8.1)

## 2020-08-06 LAB — HCG, QUANTITATIVE, PREGNANCY: hCG, Beta Chain, Quant, S: 4263 m[IU]/mL — ABNORMAL HIGH (ref <5)

## 2020-08-06 LAB — WET PREP, GENITAL
Clue Cells Wet Prep HPF POC: NONE SEEN
Sperm: NONE SEEN
Trich, Wet Prep: NONE SEEN
Yeast Wet Prep HPF POC: NONE SEEN

## 2020-08-06 LAB — CBC
HCT: 41.3 % (ref 36.0–46.0)
Hemoglobin: 13.6 g/dL (ref 12.0–15.0)
MCH: 27.5 pg (ref 26.0–34.0)
MCHC: 32.9 g/dL (ref 30.0–36.0)
MCV: 83.6 fL (ref 80.0–100.0)
Platelets: 203 10*3/uL (ref 150–400)
RBC: 4.94 MIL/uL (ref 3.87–5.11)
RDW: 12.9 % (ref 11.5–15.5)
WBC: 15.4 10*3/uL — ABNORMAL HIGH (ref 4.0–10.5)
nRBC: 0 % (ref 0.0–0.2)

## 2020-08-06 LAB — ABO/RH: ABO/RH(D): O NEG

## 2020-08-06 MED ORDER — RHO D IMMUNE GLOBULIN 1500 UNIT/2ML IJ SOSY
300.0000 ug | PREFILLED_SYRINGE | Freq: Once | INTRAMUSCULAR | Status: AC
  Administered 2020-08-06: 300 ug via INTRAMUSCULAR
  Filled 2020-08-06: qty 2

## 2020-08-06 NOTE — Discharge Instructions (Signed)
Miscarriage A miscarriage is the loss of an unborn baby (fetus) before the 20th week of pregnancy. Most miscarriages happen during the first 3 months of pregnancy. Sometimes, a miscarriage can happen before a woman knows that she is pregnant. Having a miscarriage can be an emotional experience. If you have had a miscarriage, talk with your health care provider about any questions you may have about miscarrying, the grieving process, and your plans for future pregnancy. What are the causes? A miscarriage may be caused by:  Problems with the genes or chromosomes of the fetus. These problems make it impossible for the baby to develop normally. They are often the result of random errors that occur early in the development of the baby, and are not passed from parent to child (not inherited).  Infection of the cervix or uterus.  Conditions that affect hormone balance in the body.  Problems with the cervix, such as the cervix opening and thinning before pregnancy is at term (cervical insufficiency).  Problems with the uterus. These may include: ? A uterus with an abnormal shape. ? Fibroids in the uterus. ? Congenital abnormalities. These are problems that were present at birth.  Certain medical conditions.  Smoking, drinking alcohol, or using drugs.  Injury (trauma). In many cases, the cause of a miscarriage is not known. What are the signs or symptoms? Symptoms of this condition include:  Vaginal bleeding or spotting, with or without cramps or pain.  Pain or cramping in the abdomen or lower back.  Passing fluid, tissue, or blood clots from the vagina. How is this diagnosed? This condition may be diagnosed based on:  A physical exam.  Ultrasound.  Blood tests.  Urine tests. How is this treated? Treatment for a miscarriage is sometimes not necessary if you naturally pass all the tissue that was in your uterus. If necessary, this condition may be treated with:  Dilation and  curettage (D&C). This is a procedure in which the cervix is stretched open and the lining of the uterus (endometrium) is scraped. This is done only if tissue from the fetus or placenta remains in the body (incomplete miscarriage).  Medicines, such as: ? Antibiotic medicine, to treat infection. ? Medicine to help the body pass any remaining tissue. ? Medicine to reduce (contract) the size of the uterus. These medicines may be given if you have a lot of bleeding. If you have Rh negative blood and your baby was Rh positive, you will need a shot of a medicine called Rh immunoglobulinto protect your future babies from Rh blood problems. "Rh-negative" and "Rh-positive" refer to whether or not the blood has a specific protein found on the surface of red blood cells (Rh factor). Follow these instructions at home: Medicines   Take over-the-counter and prescription medicines only as told by your health care provider.  If you were prescribed antibiotic medicine, take it as told by your health care provider. Do not stop taking the antibiotic even if you start to feel better.  Do not take NSAIDs, such as aspirin and ibuprofen, unless they are approved by your health care provider. These medicines can cause bleeding. Activity  Rest as directed. Ask your health care provider what activities are safe for you.  Have someone help with home and family responsibilities during this time. General instructions  Keep track of the number of sanitary pads you use each day and how soaked (saturated) they are. Write down this information.  Monitor the amount of tissue or blood clots that   you pass from your vagina. Save any large amounts of tissue for your health care provider to examine.  Do not use tampons, douche, or have sex until your health care provider approves.  To help you and your partner with the process of grieving, talk with your health care provider or seek counseling.  When you are ready, meet with  your health care provider to discuss any important steps you should take for your health. Also, discuss steps you should take to have a healthy pregnancy in the future.  Keep all follow-up visits as told by your health care provider. This is important. Where to find more information  The American Congress of Obstetricians and Gynecologists: www.acog.org  [place] Department of Health and Programmer, systems of Women's Health: [place] Contact a health care provider if:  You have a fever or chills.  You have a foul smelling vaginal discharge.  You have more bleeding instead of less. Get help right away if:  You have severe cramps or pain in your back or abdomen.  You pass blood clots or tissue from your vagina that is walnut-sized or larger.  You soak more than 1 regular sanitary pad in an hour.  You become light-headed or weak.  You pass out.  You have feelings of sadness that take over your thoughts, or you have thoughts of hurting yourself. Summary  Most miscarriages happen in the first 3 months of pregnancy. Sometimes miscarriage happens before a woman even knows that she is pregnant.  Follow your health care provider's instruction for home care. Keep all follow-up appointments.  To help you and your partner with the process of grieving, talk with your health care provider or seek counseling. This information is not intended to replace advice given to you by your health care provider. Make sure you discuss any questions you have with your health care provider. Document Revised: 12/16/2018 Document Reviewed: 09/29/2016 Elsevier Patient Education  [place] Pregnancy Loss Pregnancy loss can happen any time during a pregnancy. Often the cause is not known. It is rarely because of anything you did. Pregnancy loss in early pregnancy (during the first trimester) is called a miscarriage. This type of pregnancy loss is the most common.  Pregnancy loss that happens after 20 weeks of pregnancy is called fetal demise if the baby's heart stops beating before birth. Fetal demise is much less common. Some women experience spontaneous labor shortly after fetal demise resulting in a stillborn birth (stillbirth). Any pregnancy loss can be devastating. You will need to recover both physically and emotionally. Most women are able to get pregnant again after a pregnancy loss and deliver a healthy baby. How to manage emotional recovery  Pregnancy loss is very hard emotionally. You may feel many different emotions while you grieve. You may feel sad and angry. You may also feel guilty. It is normal to have periods of crying. Emotional recovery can take longer than physical recovery. It is different for everyone. Taking these steps can help you in managing this loss:  Remember that it is unlikely you did anything to cause the pregnancy loss.  Share your thoughts and feelings with friends, family, and your partner. Remember that your partner is also recovering emotionally.  Make sure you have a good support system. Do not spend too much time alone.  Meet with a pregnancy loss counselor or join a pregnancy loss support group.  Get enough sleep and eat a  healthy diet. Return to regular exercise when you have recovered physically.  Do not use drugs or alcohol to manage your emotions.  Consider seeing a mental health professional to help you recover emotionally.  Ask a friend or loved one to help you decide what to do with any clothing and nursery items you received for your baby. In the case of a stillbirth, many women benefit from taking additional steps in the grieving process. You may want to:  Hold your baby after the birth.  Name your baby.  Request a birth certificate.  Create a keepsake such as handprints or footprints.  Dress your baby and have a picture taken.  Make funeral arrangements.  Ask for a baptism or  blessing. Hospitals have staff members who can help you with all these arrangements. How to recognize emotional stress It is normal to have emotional stress after a pregnancy loss. But emotional stress that lasts a long time or becomes severe requires treatment. Watch out for these signs of severe emotional stress:  Sadness, anger, or guilt that is not going away and is interfering with your normal activities.  Relationship problems that have occurred or gotten worse since the pregnancy loss.  Signs of depression that last longer than 2 weeks. These may include: ? Sadness. ? Anxiety. ? Hopelessness. ? Loss of interest in activities you enjoy. ? Inability to concentrate. ? Trouble sleeping or sleeping too much. ? Loss of appetite or overeating. ? Thoughts of death or of hurting yourself. Follow these instructions at home:  Take over-the-counter and prescription medicines only as told by your health care provider.  Rest at home until your energy level returns. Return to your normal activities as told by your health care provider. Ask your health care provider what activities are safe for you.  When you are ready, meet with your health care provider to discuss steps to take for a future pregnancy.  Keep all follow-up visits as told by your health care provider. This is important. Where to find support  To help you and your partner with the process of grieving, talk with your health care provider or seek counseling.  Consider meeting with others who have experienced pregnancy loss. Ask your health care provider about support groups and resources. Where to find more information  [place]S. Department of Health and Programmer, systems on Women's Health: [place]  American Pregnancy Association: www.americanpregnancy.org Contact a health care provider if:  You continue to experience grief, sadness, or lack of motivation for everyday activities, and those feelings do not  improve over time.  You are struggling to recover emotionally, especially if you are using alcohol or substances to help. Get help right away if:  You have thoughts of hurting yourself or others. If you ever feel like you may hurt yourself or others, or have thoughts about taking your own life, get help right away. You can go to your nearest emergency department or call:  Your local emergency services (911 in the [place]  A suicide crisis helpline, such as the [place] at (817)514-8234. This is open 24 hours a day. Summary  Any pregnancy loss can be difficult physically and emotionally.  You may experience many different emotions while you grieve. Emotional recovery can last longer than physical recovery.  It is normal to have emotional stress after a pregnancy loss. But emotional stress that lasts a long time or becomes severe requires treatment.  See your health care provider if you are struggling emotionally  after a pregnancy loss. This information is not intended to replace advice given to you by your health care provider. Make sure you discuss any questions you have with your health care provider. Document Revised: 12/14/2018 Document Reviewed: 11/04/2017 Elsevier Patient Education  2020 Elsevier Inc.       Misoprostol tablets What is this medicine? MISOPROSTOL (mye soe PROST ole) helps to prevent stomach ulcers in patients who take medicines like ibuprofen and aspirin and who are at high risk of complications from ulcers. This medicine may be used for other purposes; ask your health care provider or pharmacist if you have questions. COMMON BRAND NAME(S): Cytotec What should I tell my health care provider before I take this medicine? They need to know if you have any of these conditions:  Crohn's disease  heart disease  kidney disease  ulcerative colitis  an unusual or allergic reaction to misoprostol, prostaglandins, other medicines,  foods, dyes, or preservatives  pregnant or trying to get pregnant  breast-feeding How should I use this medicine? Take this medicine by mouth with a full glass of water. Follow the directions on the prescription label. Take this medicine with food.Take your medicine at regular intervals. Do not take your medicine more often than directed. Talk to your pediatrician regarding the use of this medicine in children. Special care may be needed. Overdosage: If you think you have taken too much of this medicine contact a poison control center or emergency room at once. NOTE: This medicine is only for you. Do not share this medicine with others. What if I miss a dose? If you miss a dose, take it as soon as you can. If it is almost time for your next dose, take only that dose. Do not take double or extra doses. What may interact with this medicine?  antacids This list may not describe all possible interactions. Give your health care provider a list of all the medicines, herbs, non-prescription drugs, or dietary supplements you use. Also tell them if you smoke, drink alcohol, or use illegal drugs. Some items may interact with your medicine. What should I watch for while using this medicine? Do not smoke cigarettes or drink alcohol. These increase irritation to your stomach and can make it more susceptible to damage from medicine like ibuprofen and aspirin. If you are female, do not use this medicine if you are pregnant. Do not get pregnant while taking this medicine and for at least one month (one full menstrual cycle) after stopping this medicine. If you can become pregnant, use a reliable form of birth control while taking this medicine. Talk to your doctor about birth control options. If you do become pregnant, think you are pregnant, or want to become pregnant, immediately call your doctor for advice. What side effects may I notice from receiving this medicine? Side effects that you should report to  your doctor or health care professional as soon as possible:  allergic reactions like skin rash, itching or hives, swelling of the face, lips, or tongue  chest pain  fainting spells  severe diarrhea  sudden shortness of breath  unusual vaginal bleeding, pelvic pain, or cramping Side effects that usually do not require medical attention (report to your doctor or health care professional if they continue or are bothersome):  dizziness  headache  menstrual irregularity, spotting, or cramps  mild diarrhea  nausea  stomach upset or cramps This list may not describe all possible side effects. Call your doctor for medical advice about side  effects. You may report side effects to FDA at 1-800-FDA-1088. Where should I keep my medicine? Keep out of the reach of children. Store at room temperature below 25 degrees C (77 degrees F). Keep in a dry place. Protect from moisture. Throw away any unused medicine after the expiration date. NOTE: This sheet is a summary. It may not cover all possible information. If you have questions about this medicine, talk to your doctor, pharmacist, or health care provider.  2020 Elsevier/Gold Standard (2008-08-07 10:59:53)       FACTS YOU SHOULD KNOW  WHAT IS AN EARLY PREGNANCY FAILURE? Once the egg is fertilized with the sperm and begins to develop, it attaches to the lining of the uterus. This early pregnancy tissue may not develop into an embryo (the beginning stage of a baby). Sometimes an embryo does develop but does not continue to grow. These problems can be seen on ultrasound.   MANAGEMNT OF EARLY PREGNANCY FAILURE: About 4 out of 100 (0.25%) women will have a pregnancy loss in her lifetime.  One in five pregnancies is found to be an early pregnancy failure.  There are 3 ways to care for an early pregnancy failure:   (1) Surgery, (2) Medicine, (3) Waiting for you to pass the pregnancy on your own. The decision as to how to proceed after being  diagnosed with and early pregnancy failure is an individual one.  The decision can be made only after appropriate counseling.  You need to weigh the pros and cons of the 3 choices. Then you can make the choice that works for you. SURGERY (D&E) . Procedure over in 1 day . Requires being put to sleep . Bleeding may be light . Possible problems during surgery, including injury to womb(uterus) . Care provider has more control Medicine (CYTOTEC) . The complete procedure may take days to weeks . No Surgery . Bleeding may be heavy at times . There may be drug side effects . Patient has more control Waiting . You may choose to wait, in which case your own body may complete the passing of the abnormal early pregnancy on its own in about 2-4 weeks . Your bleeding may be heavy at times . There is a small possibility that you may need surgery if the bleeding is too much or not all of the pregnancy has passed. CYTOTEC MANAGEMENT Prostaglandins (cytotec) are the most widely used drug for this purpose. They cause the uterus to cramp and contract. You will place the medicine yourself inside your vagina in the privacy of your home. Empting of the uterus should occur within 3 days but the process may continue for several weeks. The bleeding may seem heavy at times. POSSIBLE SIDE EFFECTS FROM CYTOTEC . Nausea   Vomiting . Diarrhea Fever . Chills  Hot Flashes Side effects  from the process of the early pregnancy failure include: . Cramping  Bleeding . Headaches  Dizziness RISKS: This is a low risk procedure. Less than 1 in 100 women has a complication. An incomplete passage of the early pregnancy may occur. Also, Hemorrhage (heavy bleeding) could happen.  Rarely the pregnancy will not be passed completely. Excessively heavy bleeding may occur.  Your doctor may need to perform surgery to empty the uterus (D&E). Afterwards: Everybody will feel differently after the early pregnancy completion. You may have  soreness or cramps for a day or two. You may have soreness or cramps for day or two.  You may have light bleeding for up to  2 weeks. You may be as active as you feel like being. If you have any of the following problems you may call [facility] at [phone]. . If you have pain that does not get better  with pain medication . Bleeding that soaks through 2 thick full-sized sanitary pads in an hour . Cramps that last longer than 2 days . Foul smelling discharge . Fever above 100.4 degrees F Even if you do not have any of these symptoms, you should have a follow-up exam to make sure you are healing properly. This appointment will be made for you before you leave the hospital. Your next normal period will start again in 4-6 week after the loss. You can get pregnant soon after the loss, so use birth control right away. Finally: Make sure all your questions are answered before during and after any procedure. Follow up with medical care and family planning methods.

## 2020-08-06 NOTE — [facility] Note (Signed)
.   Brandy Ruiz is a 29 y.o. here in [facility] reporting: Vaginal bleeding soaking through a pad in one hour. Patient reports she has been seen for this pregnancy and they told her the baby didn't have a heartbeat on 07/15/20. Is also having lower abdominal pain.  LMP: 05/08/20  Pain score: 10 Vitals:   08/06/20 0636  BP: 139/79  Pulse: 90  Resp: 15  Temp: 98.2 F (36.8 C)  SpO2: 100%      Lab orders placed from triage: UPT

## 2020-08-06 NOTE — MAU Provider Note (Signed)
History     CSN: [id number]  Arrival date and time: 08/06/20 [id number]   First Provider Initiated Contact with Patient 08/06/20 0854      Chief Complaint  Patient presents with   Vaginal Bleeding   Abdominal Pain   Ms. Terrilyn SaverCourtney Stracener is a 29 y.o. G1P0 at 868w6d who presents to MAU for vaginal bleeding which began Sunday night. Patient reports the bleeding started as spotting and then became heavier so she came to MAU for evaluation. Patient reports she was told on 07/15/2020 that she had a failed pregnancy, but patient reports she had a negative experience at her OB's office, so she wanted a second opinion and does not plan to [facility]. Patient reports she was having severe cramping that started about 1AM this morning, but reports the cramping has subsided at this time and she is no longer bleeding heavily.  Passing blood clots? Yes, about 3cm in diameter x2-3 Blood soaking clothes? no Lightheaded/dizzy? no Significant pelvic pain or cramping? Per above Passed any tissue? Pt has not yet looked Hx ectopic pregnancy? no  Current pregnancy problems? Pt has not yet been seen Blood Type? O NEGATIVE Allergies? AMOX, beta-blockers, PCN, latex Current medications? Montelukast, albuterol, lamotrigine (anxiety/depression), Synthroid, sertraline Current PNC & next appt? Pt does not have any appointments arranged  Pt denies vaginal discharge/odor/itching. Pt denies N/V, abdominal pain, constipation, diarrhea, or urinary problems. Pt denies fever, chills, fatigue, sweating or changes in appetite. Pt denies SOB or chest pain. Pt denies dizziness, HA, light-headedness, weakness.  Patient's fiance present for entire visit.   OB History    Gravida  1   Para      Term      Preterm      AB      Living        SAB      TAB      Ectopic      Multiple      Live Births              Past Medical History:  Diagnosis Date   Asthma     History reviewed. No  pertinent surgical history.  History reviewed. No pertinent family history.  Social History   Tobacco Use   Smoking status: Current Every Day Smoker   Smokeless tobacco: Never Used  Substance Use Topics   Alcohol use: Yes   Drug use: No    Allergies:  Allergies  Allergen Reactions   Amoxicillin Hives   Other     I'm not supposed to take Beta-blockers.   Penicillins     "I turn pink."    No medications prior to admission.    Review of Systems  Constitutional: Negative for chills, diaphoresis, fatigue and fever.  Eyes: Negative for visual disturbance.  Respiratory: Negative for shortness of breath.   Cardiovascular: Negative for chest pain.  Gastrointestinal: Negative for abdominal pain, constipation, diarrhea, nausea and vomiting.  Genitourinary: Positive for pelvic pain and vaginal bleeding. Negative for dysuria, flank pain, frequency, urgency and vaginal discharge.  Neurological: Negative for dizziness, weakness, light-headedness and headaches.   Physical Exam   Blood pressure (!) 142/80, pulse 90, temperature 97.6 F (36.4 C), resp. rate 17, height 5\' 5"  (1.651 m), weight 104.3 kg, last menstrual period 05/08/2020, SpO2 99 %.  Patient Vitals for the past 24 hrs:  BP Temp Pulse Resp SpO2 Height Weight  08/06/20 1124 (!) 142/80 -- 90 17 -- -- --  08/06/20 0825 -- --  80 -- 99 % -- --  08/06/20 0759 128/67 97.6 F (36.4 C) 79 -- 100 % -- --  08/06/20 0636 139/79 98.2 F (36.8 C) 90 15 100 % 5\' 5"  (1.651 m) 104.3 kg   Physical Exam Vitals and nursing note reviewed.  Constitutional:      General: She is not in acute distress.    Appearance: Normal appearance. She is not ill-appearing, toxic-appearing or diaphoretic.  HENT:     Head: Normocephalic and atraumatic.  Pulmonary:     Effort: Pulmonary effort is normal.  Neurological:     Mental Status: She is alert and oriented to person, place, and time.  Psychiatric:        Mood and Affect: Mood normal.         Behavior: Behavior normal.        Thought Content: Thought content normal.        Judgment: Judgment normal.    Results for orders placed or performed during the hospital encounter of 08/06/20 (from the past 24 hour(s))  CBC     Status: Abnormal   Collection Time: 08/06/20  7:03 AM  Result Value Ref Range   WBC 15.4 (H) 4.0 - 10.5 K/uL   RBC 4.94 3.87 - 5.11 MIL/uL   Hemoglobin 13.6 12.0 - 15.0 g/dL   HCT 08/08/20 36 - 46 %   MCV 83.6 80.0 - 100.0 fL   MCH 27.5 26.0 - 34.0 pg   MCHC 32.9 30.0 - 36.0 g/dL   RDW 35.5 73.2 - 20.2 %   Platelets 203 150 - 400 K/uL   nRBC 0.0 0.0 - 0.2 %  ABO/Rh     Status: None   Collection Time: 08/06/20  7:03 AM  Result Value Ref Range   ABO/RH(D) O NEG   hCG, quantitative, pregnancy     Status: Abnormal   Collection Time: 08/06/20  7:03 AM  Result Value Ref Range   hCG, Beta Chain, Quant, S 4,263 (H) <5 mIU/mL  Comprehensive metabolic panel     Status: Abnormal   Collection Time: 08/06/20  7:03 AM  Result Value Ref Range   Sodium 136 135 - 145 mmol/L   Potassium 3.6 3.5 - 5.1 mmol/L   Chloride 104 98 - 111 mmol/L   CO2 19 (L) 22 - 32 mmol/L   Glucose, Bld 122 (H) 70 - 99 mg/dL   BUN 9 6 - 20 mg/dL   Creatinine, Ser 08/08/20 0.44 - 1.00 mg/dL   Calcium 9.2 8.9 - 7.06 mg/dL   Total Protein 7.2 6.5 - 8.1 g/dL   Albumin 4.1 3.5 - 5.0 g/dL   AST 20 15 - 41 U/L   ALT 19 0 - 44 U/L   Alkaline Phosphatase 50 38 - 126 U/L   Total Bilirubin 0.4 0.3 - 1.2 mg/dL   GFR, Estimated 23.7 >62 mL/min   Anion gap 13 5 - 15  Rh IG workup (includes ABO/Rh)     Status: None (Preliminary result)   Collection Time: 08/06/20  8:36 AM  Result Value Ref Range   Gestational Age(Wks) 12.6    ABO/RH(D) O NEG    Antibody Screen NEG    Unit Number 08/08/20    Blood Component Type RHIG    Unit division 00    Status of Unit ISSUED    Transfusion Status      OK TO TRANSFUSE Performed at [facility], [address] [place], [place]   [address]  prep, genital     Status: Abnormal   Collection Time: 08/06/20  9:59 AM   Specimen: Vaginal  Result Value Ref Range   Yeast Wet Prep HPF POC NONE SEEN NONE SEEN   Trich, Wet Prep NONE SEEN NONE SEEN   Clue Cells Wet Prep HPF POC NONE SEEN NONE SEEN   WBC, Wet Prep HPF POC MANY (A) NONE SEEN   Sperm NONE SEEN    US OB LESS THAN 14 WEEKS WITH OB TRANSVAGINAL  Result Date: 08/06/2020 CLINICAL DATA:  Vaginal bleeding in first trimester of pregnancy, quantitative beta HCG 4263, LMP 05/08/2020 EXAM: OBSTETRIC <14 WK [place] AND TRANSVAGINAL OB US TECHNIQUE: Both transabdominal and transvaginal ultrasound examinations were performed for complete evaluation of the gestation as well as the maternal uterus, adnexal regions, and pelvic cul-de-sac. Transvaginal technique was performed to assess early pregnancy. COMPARISON:  None FINDINGS: Intrauterine gestational sac: Absent Yolk sac:  N/A Embryo:  N/A Cardiac Activity: N/A Heart Rate: N/A  bpm MSD:   mm    w     d CRL:    mm    w    d                  [place] EDC: Subchorionic hemorrhage:  N/A Maternal uterus/adnexae: Uterus anteverted, with heterogeneous myometrium. Endometrial complex appears thickened up to 30 mm thick, at the lower uterine segment appears distended by complex hypoechoic material, question blood Minimal endometrial fluid. No gestational sac or products of conception definitely visualized. RIGHT ovary obscured by bowel loops in the adnexa. LEFT ovary unremarkable. No free pelvic fluid or adnexal masses. IMPRESSION: Endometrial canal thickened, distended by complex hypoechoic material question blood. No intrauterine gestation identified. Findings are consistent with pregnancy of unknown location. Differential diagnosis includes early intrauterine pregnancy too early to visualize, spontaneous abortion, and ectopic pregnancy. Serial quantitative beta HCG and or follow-up ultrasound recommended to definitively exclude ectopic pregnancy. Electronically Signed    By: Ulyses Southward M.D.   On: 08/06/2020 10:19    MAU Course  Procedures  MDM -r/o ectopic -CBC: WNL -CMP: WNL -[place]: thickened endometrium with possible blood in LUS, no GS -hCG: 4,263 -ABO: O NEGATIVE, RhoGAM given -WetPrep: WNL -GC/CT collected -consulted with Dr. Adrian Blackwater, can give cytotec here, pt should have f/u in one week for hCG -pt offered and declines cytotec at this time as she does not wish to have medication-induced bleeding and cramping -pad recheck after 2+ hours shows only minor spotting, pt declines pelvic exam -pt discharged to home in stable condition  Orders Placed This Encounter  Procedures   Wet prep, genital    Standing Status:   Standing    Number of Occurrences:   1   US OB LESS THAN 14 WEEKS WITH OB TRANSVAGINAL    Standing Status:   Standing    Number of Occurrences:   1    Order Specific Question:   Symptom/Reason for Exam    Answer:   Vaginal bleeding in pregnancy [[id number]]   CBC    Standing Status:   Standing    Number of Occurrences:   1   hCG, quantitative, pregnancy    Standing Status:   Standing    Number of Occurrences:   1   Comprehensive metabolic panel    Standing Status:   Standing    Number of Occurrences:   1   ABO/Rh    Standing Status:   Standing    Number of Occurrences:  1   Rh IG workup (includes ABO/Rh)    Standing Status:   Standing    Number of Occurrences:   1    Order Specific Question:   Weeks of Gestation    Answer:   12.6   Discharge patient    Order Specific Question:   Discharge disposition    Answer:   01-Home or Self Care [1]    Order Specific Question:   Discharge patient date    Answer:   08/06/2020   Meds ordered this encounter  Medications   rho (d) immune globulin (RHIG/RHOPHYLAC) injection 300 mcg    Assessment and Plan   1. Miscarriage   2. Vaginal bleeding in pregnancy   3. Rh negative state in antepartum period     Allergies as of 08/06/2020      Reactions   Amoxicillin Hives    Other    I'm not supposed to take Beta-blockers.   Penicillins    "I turn pink."      Medication List    TAKE these medications   benzonatate 100 MG capsule Commonly known as: TESSALON Take 1 capsule (100 mg total) by mouth every 8 (eight) hours.   cyclobenzaprine 10 MG tablet Commonly known as: FLEXERIL Take 1 tablet (10 mg total) by mouth 2 (two) times daily as needed for muscle spasms.   HYDROcodone-acetaminophen 5-325 MG tablet Commonly known as: NORCO/VICODIN Take 1 tablet by mouth every 4 (four) hours as needed.   ibuprofen 600 MG tablet Commonly known as: ADVIL Take 1 tablet (600 mg total) by mouth every 6 (six) hours as needed.   loratadine 10 MG tablet Commonly known as: Claritin Take 1 tablet (10 mg total) by mouth daily.   predniSONE 20 MG tablet Commonly known as: DELTASONE 3 tabs po day one, then 2 po daily x 4 days      -will call with culture results, if positive -discussed expected s/sx post-SAB -message sent to [facility] to schedule hCG lab only visit in one week, and provider/hCG visit in two weeks -pt reports she has Cytotec at home and was given this by her private OB, but did not take it, discussed reasons to take and how to take and what to expect post-administration -return MAU precautions given -pt discharged to home in stable condition  Joni Reining E Anaisabel Pederson 08/06/2020, 11:40 AM

## 2020-08-07 LAB — RH IG WORKUP (INCLUDES ABO/RH)
ABO/RH(D): O NEG
Antibody Screen: NEGATIVE
Gestational Age(Wks): 12.6
Unit division: 0

## 2020-08-07 LAB — GC/CHLAMYDIA PROBE AMP (~~LOC~~) NOT AT ARMC
Chlamydia: NEGATIVE
Comment: NEGATIVE
Comment: NORMAL
Neisseria Gonorrhea: NEGATIVE

## 2020-08-13 ENCOUNTER — Other Ambulatory Visit: Payer: Commercial Managed Care - PPO

## 2020-08-13 ENCOUNTER — Other Ambulatory Visit: Payer: Self-pay

## 2020-08-13 DIAGNOSIS — O4690 Antepartum hemorrhage, unspecified, unspecified trimester: Secondary | ICD-10-CM

## 2020-08-14 LAB — BETA HCG QUANT (REF LAB): hCG Quant: 73 m[IU]/mL

## 2020-08-20 ENCOUNTER — Encounter: Payer: Self-pay | Admitting: Advanced Practice Midwife

## 2020-08-20 ENCOUNTER — Other Ambulatory Visit: Payer: Self-pay

## 2020-08-20 ENCOUNTER — Ambulatory Visit: Payer: Commercial Managed Care - PPO | Admitting: Advanced Practice Midwife

## 2020-08-20 VITALS — BP 137/93 | HR 76 | Wt 235.0 lb

## 2020-08-20 DIAGNOSIS — O039 Complete or unspecified spontaneous abortion without complication: Secondary | ICD-10-CM | POA: Diagnosis not present

## 2020-08-20 NOTE — Progress Notes (Signed)
Patient here to F/U SAB  Pt denies any pain or bleeding today.   Last HCG was 08/13/20  Pt wants to try again.

## 2020-08-20 NOTE — Progress Notes (Signed)
  GYNECOLOGY PROGRESS NOTE  History:  29 y.o. G1P0 presents to [facility] office today for miscarriage follow up visit. She reports no pain or bleeding.  She denies h/a, dizziness, shortness of breath, n/v, or fever/chills.    The following portions of the patient's history were reviewed and updated as appropriate: allergies, current medications, past family history, past medical history, past social history, past surgical history and problem list.   Review of Systems:  Pertinent items are noted in HPI.   Objective:  Physical Exam Blood pressure (!) 137/93, pulse 76, weight 235 lb (106.6 kg), last menstrual period 05/08/2020, unknown if currently breastfeeding. VS reviewed, nursing note reviewed,  Constitutional: well developed, well nourished, no distress HEENT: normocephalic CV: normal rate Pulm/chest wall: normal effort Breast Exam: deferred Abdomen: soft Neuro: alert and oriented x 3 Skin: warm, dry Psych: affect normal Pelvic exam: deferred  Assessment & Plan:  1. Miscarriage --Pt without symptoms today --Questions answered about miscarriage and about pt care received in MAU. Pt feelings validated, and reassurance provided  --Hcg dropped from 4,263 on 11/30 down to 73 on 12/7 --Repeat hcg today --Pt desires another pregnancy, discussed, no barriers, pt to follow up with [facility] for OB/Gyn care as desired   Sharen Counter, CNM 10:48 PM

## 2020-08-21 LAB — BETA HCG QUANT (REF LAB): hCG Quant: 14 m[IU]/mL

## 2020-09-07 NOTE — L&D Delivery Note (Signed)
Delivery Note At 0955 a viable female infant was delivered via SVD, presentation: LOA. APGAR: 8, 9; weight pending.   Placenta status: spontaneously delivered intact with gentle cord traction; marginal CI noted. Fundus firm with massage and Pitocin.   Anesthesia: epidural Lacerations: periurethral-hemostatic Suture used for repair: n/a Est. Blood Loss (mL): 100 Placenta to LD Complications: loose nuchal x1, reduced Cord ph n/a   Mom to postpartum. Baby to Couplet care / Skin to Skin.    Donette Larry, CNM 08/23/2021 10:10 AM

## 2021-01-07 ENCOUNTER — Other Ambulatory Visit: Payer: Self-pay

## 2021-01-07 ENCOUNTER — Ambulatory Visit (INDEPENDENT_AMBULATORY_CARE_PROVIDER_SITE_OTHER): Payer: Managed Care, Other (non HMO)

## 2021-01-07 DIAGNOSIS — R1110 Vomiting, unspecified: Secondary | ICD-10-CM

## 2021-01-07 NOTE — Progress Notes (Signed)
This patient presented to the office for a UPT. Patient reported she been vomiting bile for 2-3 days. She reported being constipated and dehydrated. I have advised her to go to ER now to be seen for her symptoms and we can make her another appointment for UPT once she feels better.  She reported she works 60 hours a week and does not have time to run back and forth to one doctor to the next.I explain to patient how taking care of herself should be first priority. Patient voice understanding.

## 2021-01-08 ENCOUNTER — Emergency Department (HOSPITAL_COMMUNITY): Admission: EM | Admit: 2021-01-08 | Payer: Managed Care, Other (non HMO) | Source: Home / Self Care

## 2021-01-08 ENCOUNTER — Other Ambulatory Visit: Payer: Self-pay

## 2021-01-08 ENCOUNTER — Encounter (HOSPITAL_COMMUNITY): Payer: Self-pay

## 2021-01-08 ENCOUNTER — Ambulatory Visit (HOSPITAL_COMMUNITY)
Admission: EM | Admit: 2021-01-08 | Discharge: 2021-01-08 | Disposition: A | Discharge status: Home / Self Care | Payer: Managed Care, Other (non HMO)

## 2021-01-08 NOTE — ED Triage Notes (Addendum)
Pt in with c/o vomiting, dehydration that has been going on for a few weeks  Pt states she took at home pregnancy test 4 weeks ago with positive results. LMP was back in march  Pt has not been able to get an appt with obgyn  States she is unable to tolerate food or fluids and is not urinating much

## 2021-01-08 NOTE — ED Notes (Signed)
Patient is being discharged from the Urgent Care and sent to the Emergency Department via pov . Per Wallis Bamberg, PA, patient is in need of higher level of care due to severe dehydration, possible pregnancy. Patient is aware and verbalizes understanding of plan of care.  Vitals:   01/08/21 1750  BP: 117/82  Pulse: 85  Resp: 19  Temp: 99.1 F (37.3 C)  SpO2: 97%

## 2021-02-17 ENCOUNTER — Other Ambulatory Visit: Payer: Self-pay

## 2021-02-17 ENCOUNTER — Ambulatory Visit (INDEPENDENT_AMBULATORY_CARE_PROVIDER_SITE_OTHER): Payer: PRIVATE HEALTH INSURANCE

## 2021-02-17 VITALS — BP 138/88 | HR 97 | Ht 65.0 in | Wt 237.8 lb

## 2021-02-17 DIAGNOSIS — Z3491 Encounter for supervision of normal pregnancy, unspecified, first trimester: Secondary | ICD-10-CM | POA: Insufficient documentation

## 2021-02-17 DIAGNOSIS — O3680X0 Pregnancy with inconclusive fetal viability, not applicable or unspecified: Secondary | ICD-10-CM

## 2021-02-17 DIAGNOSIS — Z3481 Encounter for supervision of other normal pregnancy, first trimester: Secondary | ICD-10-CM | POA: Diagnosis not present

## 2021-02-17 DIAGNOSIS — Z3A12 12 weeks gestation of pregnancy: Secondary | ICD-10-CM | POA: Diagnosis not present

## 2021-02-17 NOTE — Progress Notes (Signed)
New OB Intake  I connected with  [name] on 02/17/21 at  1:15 PM EDT by in person. Video Visit and verified that I am speaking with the correct person using two identifiers. Nurse is located at [facility] and pt is located at [place].  I discussed the limitations, risks, security and privacy concerns of performing an evaluation and management service by telephone and the availability of in person appointments. I also discussed with the patient that there may be a patient responsible charge related to this service. The patient expressed understanding and agreed to proceed.  I explained I am completing New OB Intake today. We discussed her EDD of 08/29/21 that is based on LMP of 11/22/20. Pt is G2/P0. I reviewed her allergies, medications, Medical/Surgical/OB history, and appropriate screenings. I informed her of [facility] services. Based on history, this is a/an  pregnancy uncomplicated .   There are no problems to display for this patient.   Concerns addressed today  Delivery Plans:  Plans to deliver at [facility].   MyChart/Babyscripts MyChart access verified. I explained pt will have some visits in office and some virtually. Babyscripts instructions given and order placed. Patient verifies receipt of registration text/e-mail. Account successfully created and app downloaded.  Blood Pressure Cuff  Patient has private insurance; instructed to purchase blood pressure cuff and bring to first prenatal appt. Explained after first prenatal appt pt will check weekly and document in Babyscripts.  Weight scale: Patient    have weight scale. Weight scale ordered   Anatomy US Explained first scheduled [place] will be around 19 weeks. Dating and viability scan performed today.  Labs Discussed Avelina Laine genetic screening with patient. Would like both Panorama and Horizon drawn at new OB visit. Routine prenatal labs needed.  Covid Vaccine Patient has not covid vaccine.   Social Determinants of  Health Food Insecurity: Patient denies food insecurity. WIC Referral: Patient is interested in referral to [facility].  Transportation: Patient denies transportation needs. Childcare: Discussed no children allowed at ultrasound appointments. Offered childcare services; patient declines childcare services at this time.  First visit review I reviewed new OB appt with pt. I explained she will have a pelvic exam, ob bloodwork with genetic screening, and PAP smear. Explained pt will be seen by Coral Ceo at first visit; encounter routed to appropriate provider. Explained that patient will be seen by pregnancy navigator following visit with provider. [facility] information placed in AVS.   Hamilton Capri, RN 02/17/2021  1:14 PM

## 2021-02-17 NOTE — Progress Notes (Signed)
Agree with A & P. 

## 2021-02-18 ENCOUNTER — Ambulatory Visit (INDEPENDENT_AMBULATORY_CARE_PROVIDER_SITE_OTHER): Payer: PRIVATE HEALTH INSURANCE | Admitting: Advanced Practice Midwife

## 2021-02-18 ENCOUNTER — Other Ambulatory Visit (HOSPITAL_COMMUNITY)
Admission: RE | Admit: 2021-02-18 | Discharge: 2021-02-18 | Disposition: A | Discharge status: Home / Self Care | Payer: PRIVATE HEALTH INSURANCE | Source: Ambulatory Visit | Attending: Obstetrics | Admitting: Obstetrics

## 2021-02-18 ENCOUNTER — Other Ambulatory Visit: Payer: Self-pay | Admitting: Advanced Practice Midwife

## 2021-02-18 ENCOUNTER — Encounter: Payer: Self-pay | Admitting: Advanced Practice Midwife

## 2021-02-18 VITALS — BP 135/87 | HR 93 | Wt 238.8 lb

## 2021-02-18 DIAGNOSIS — F419 Anxiety disorder, unspecified: Secondary | ICD-10-CM | POA: Insufficient documentation

## 2021-02-18 DIAGNOSIS — Z3481 Encounter for supervision of other normal pregnancy, first trimester: Secondary | ICD-10-CM | POA: Diagnosis present

## 2021-02-18 DIAGNOSIS — O99330 Smoking (tobacco) complicating pregnancy, unspecified trimester: Secondary | ICD-10-CM | POA: Insufficient documentation

## 2021-02-18 DIAGNOSIS — O99340 Other mental disorders complicating pregnancy, unspecified trimester: Secondary | ICD-10-CM

## 2021-02-18 DIAGNOSIS — F17200 Nicotine dependence, unspecified, uncomplicated: Secondary | ICD-10-CM | POA: Insufficient documentation

## 2021-02-18 DIAGNOSIS — F319 Bipolar disorder, unspecified: Secondary | ICD-10-CM

## 2021-02-18 DIAGNOSIS — O219 Vomiting of pregnancy, unspecified: Secondary | ICD-10-CM

## 2021-02-18 MED ORDER — BLOOD PRESSURE KIT DEVI: 0 refills | Status: DC

## 2021-02-18 MED ORDER — DOXYLAMINE-PYRIDOXINE 10-10 MG PO TBEC: DELAYED_RELEASE_TABLET | ORAL | 5 refills | Status: DC

## 2021-02-18 MED ORDER — BUSPIRONE HCL 10 MG PO TABS: 10.0000 mg | ORAL_TABLET | Freq: Three times a day (TID) | ORAL | 0 refills | Status: DC | PRN

## 2021-02-18 MED ORDER — ONDANSETRON 4 MG PO TBDP: 4.0000 mg | ORAL_TABLET | Freq: Four times a day (QID) | ORAL | 2 refills | Status: DC | PRN

## 2021-02-18 NOTE — Progress Notes (Signed)
Subjective:   Brandy Ruiz is a 30 y.o. G2P0 at [redacted]w[redacted]d by LMP being seen today for her first obstetrical visit.  Her obstetrical history is significant for obesity and has Encounter for supervision of normal pregnancy in first trimester on their problem list.. Patient does intend to breast feed. Pregnancy history fully reviewed.  Patient reports nausea and anxiety .  HISTORY: OB History  Gravida Para Term Preterm AB Living  2 0 0 0 0 0  SAB IAB Ectopic Multiple Live Births  0 0 0 0 0    # Outcome Date GA Lbr Len/2nd Weight Sex Delivery Anes PTL Lv  2 Current           1 Gravida            Past Medical History:  Diagnosis Date   Asthma    Environmental and seasonal allergies    Heart murmur    Past Surgical History:  Procedure Laterality Date   ADENOIDECTOMY     FOOT SURGERY Right 2015   TONSILECTOMY/ADENOIDECTOMY WITH MYRINGOTOMY     Family History  Problem Relation Age of Onset   Diabetes Maternal Grandmother    Social History   Tobacco Use   Smoking status: Every Day    Pack years: 0.00   Smokeless tobacco: Never  Vaping Use   Vaping Use: Never used  Substance Use Topics   Alcohol use: Not Currently    Comment: not since confirmed pregnancy   Drug use: Not Currently    Comment: CBD gummy, not since confirmed pregnancy   Allergies  Allergen Reactions   Amoxicillin Hives   Other     I'm not supposed to take Beta-blockers.   Penicillins     "I turn pink."   Current Outpatient Medications on File Prior to Visit  Medication Sig Dispense Refill   albuterol (VENTOLIN HFA) 108 (90 Base) MCG/ACT inhaler Inhale 2 puffs into the lungs every 4 (four) hours as needed. (Patient not taking: Reported on 02/18/2021)     ALBUTEROL IN Inhale into the lungs. (Patient not taking: Reported on 02/18/2021)     EPINEPHrine 0.3 mg/0.3 mL IJ SOAJ injection INJECT FOR SEVERE ALLERGIC REACTION AS DIRECTED (Patient not taking: Reported on 02/18/2021)     lamoTRIgine  (LAMICTAL) 100 MG tablet Take 2 tablets by mouth daily. (Patient not taking: Reported on 02/18/2021)     No current facility-administered medications on file prior to visit.     Indications for ASA therapy (per uptodate) One of the following: Previous pregnancy with preeclampsia, especially early onset and with an adverse outcome No Multifetal gestation No Chronic hypertension No Type 1 or 2 diabetes mellitus No Chronic kidney disease No Autoimmune disease (antiphospholipid syndrome, systemic lupus erythematosus) No   Two or more of the following: Nulliparity Yes Obesity (body mass index >30 kg/m2) Yes Family history of preeclampsia in mother or sister No Age ?35 years No Sociodemographic characteristics (African American race, low socioeconomic level) No Personal risk factors (eg, previous pregnancy with low birth weight or small for gestational age infant, previous adverse pregnancy outcome [eg, stillbirth], interval >10 years between pregnancies) No   Indications for early 1 hour GTT (per uptodate)  BMI >25 (>23 in Asian women) AND one of the following  Gestational diabetes mellitus in a previous pregnancy No Glycated hemoglobin ?5.7 percent (39 mmol/mol), impaired glucose tolerance, or impaired fasting glucose on previous testing No First-degree relative with diabetes No High-risk race/ethnicity (eg, African American, Latino, Native  American, Asian American, Pacific Islander) No History of cardiovascular disease No Hypertension or on therapy for hypertension No High-density lipoprotein cholesterol level <35 mg/dL (0.62 mmol/L) and/or a triglyceride level >250 mg/dL (3.76 mmol/L) No Polycystic ovary syndrome No Physical inactivity No Other clinical condition associated with insulin resistance (eg, severe obesity, acanthosis nigricans) No Previous birth of an infant weighing ?4000 g No Previous stillbirth of unknown cause No Exam   Vitals:   02/18/21 1312  BP: 135/87   Pulse: 93  Weight: 238 lb 12.8 oz (108.3 kg)   Fetal Heart Rate (bpm): 154  VS reviewed, nursing note reviewed,  Constitutional: well developed, well nourished, no distress HEENT: normocephalic CV: normal rate Pulm/chest wall: normal effort Abdomen: soft Neuro: alert and oriented x 3 Skin: warm, dry Psych: affect normal     Assessment:   Pregnancy: G2P0 Patient Active Problem List   Diagnosis Date Noted   Encounter for supervision of normal pregnancy in first trimester 02/17/2021     Plan:  1. Encounter for supervision of other normal pregnancy in first trimester --Anticipatory guidance about next visits/weeks of pregnancy given. --Next visit in 4 weeks --Request Pap records from PCP Dr Brandy Ruiz  - Obstetric Panel, Including HIV - Genetic Screening - Culture, OB Urine - Cervicovaginal ancillary only( [facility]) - Hemoglobin A1c  2. Anxiety during pregnancy --Pt with hx bipolar/anxiety/depresion --Took Lamictal, has been off several months --Pt to see Brandy Ruiz, continue counseling with outpatient BH as established --Consider resuming Lamictal PRN --Start anxiety medication with Buspar, pt also starting Diclegis for nausea, which may assist with reducing anxiety  - Ambulatory referral to Integrated Behavioral Health - busPIRone (BUSPAR) 10 MG tablet; Take 1 tablet (10 mg total) by mouth 3 (three) times daily as needed.  Dispense: 30 tablet; Refill: 0  3. Bipolar 1 disorder (HCC) --Dx per pt, records requested --Not currently on medications --F/U with [facility], add medications as needed - Ambulatory referral to Integrated Behavioral Health  4. Nausea and vomiting during pregnancy prior to [redacted] weeks gestation --Pt reports vomiting 5-6 times daily. Was prescribed medication by Dr Brandy Ruiz but has not picked it up. --Rx for Diclegis and Zofran, start Diclegis at bedtime, Zofran in daytime, stop/reduce Zofran and add Diclegis daytime doses once no longer causes sedation  -  Doxylamine-Pyridoxine (DICLEGIS) 10-10 MG TBEC; Take 2 tabs at bedtime. If needed, add another tab in the morning. If needed, add another tab in the afternoon, up to 4 tabs/day.  Dispense: 100 tablet; Refill: 5 - ondansetron (ZOFRAN ODT) 4 MG disintegrating tablet; Take 1 tablet (4 mg total) by mouth every 6 (six) hours as needed for nausea.  Dispense: 20 tablet; Refill: 2    Initial labs drawn. Continue prenatal vitamins. Discussed and offered genetic screening options, including Quad screen/AFP, NIPS testing, and option to decline testing. Benefits/risks/alternatives reviewed. Pt aware that anatomy US is form of genetic screening with lower accuracy in detecting trisomies than blood work.  Pt chooses genetic screening today. First trimester screen, Quad screen, and NIPS: ordered. Ultrasound discussed; fetal anatomic survey: ordered. Problem list reviewed and updated. The nature of [facility] with multiple MDs and other Advanced Practice Providers was explained to patient; also emphasized that residents, students are part of our team. Routine obstetric precautions reviewed. No follow-ups on file.   Sharen Counter, CNM 02/18/21 1:30 PM

## 2021-02-19 LAB — OBSTETRIC PANEL, INCLUDING HIV
Antibody Screen: NEGATIVE
Basophils Absolute: 0 10*3/uL (ref 0.0–0.2)
Basos: 0 %
EOS (ABSOLUTE): 0.4 10*3/uL (ref 0.0–0.4)
Eos: 4 %
HIV Screen 4th Generation wRfx: NONREACTIVE
Hematocrit: 39.5 % (ref 34.0–46.6)
Hemoglobin: 13.3 g/dL (ref 11.1–15.9)
Hepatitis B Surface Ag: NEGATIVE
Immature Grans (Abs): 0.1 10*3/uL (ref 0.0–0.1)
Immature Granulocytes: 1 %
Lymphocytes Absolute: 1.8 10*3/uL (ref 0.7–3.1)
Lymphs: 18 %
MCH: 28.1 pg (ref 26.6–33.0)
MCHC: 33.7 g/dL (ref 31.5–35.7)
MCV: 84 fL (ref 79–97)
Monocytes Absolute: 0.5 10*3/uL (ref 0.1–0.9)
Monocytes: 5 %
Neutrophils Absolute: 7.4 10*3/uL — ABNORMAL HIGH (ref 1.4–7.0)
Neutrophils: 72 %
Platelets: 219 10*3/uL (ref 150–450)
RBC: 4.73 x10E6/uL (ref 3.77–5.28)
RDW: 14 % (ref 11.7–15.4)
RPR Ser Ql: NONREACTIVE
Rh Factor: NEGATIVE
Rubella Antibodies, IGG: 6.24 index (ref >0.99)
WBC: 10.1 10*3/uL (ref 3.4–10.8)

## 2021-02-19 LAB — CERVICOVAGINAL ANCILLARY ONLY
Chlamydia: NEGATIVE
Comment: NEGATIVE
Comment: NEGATIVE
Comment: NORMAL
Neisseria Gonorrhea: NEGATIVE
Trichomonas: NEGATIVE

## 2021-02-19 LAB — HEMOGLOBIN A1C
Est. average glucose Bld gHb Est-mCnc: 114 mg/dL
Hgb A1c MFr Bld: 5.6 % (ref 4.8–5.6)

## 2021-02-24 ENCOUNTER — Ambulatory Visit (INDEPENDENT_AMBULATORY_CARE_PROVIDER_SITE_OTHER): Payer: PRIVATE HEALTH INSURANCE | Admitting: Licensed Clinical Social Worker

## 2021-02-24 DIAGNOSIS — O99340 Other mental disorders complicating pregnancy, unspecified trimester: Secondary | ICD-10-CM | POA: Diagnosis not present

## 2021-02-24 DIAGNOSIS — F419 Anxiety disorder, unspecified: Secondary | ICD-10-CM

## 2021-02-26 ENCOUNTER — Encounter: Payer: Self-pay | Admitting: Advanced Practice Midwife

## 2021-02-26 NOTE — BH Specialist Note (Signed)
Integrated Behavioral Health via Telemedicine Visit  02/26/2021 Brandy Ruiz [id number]  Number of Integrated Behavioral Health visits: 1 Session Start time: 10:30am  Session End time: 10:52am Total time: 22 mins via mychart video   Referring Provider: L. Leftwich Ruiz CNM Patient/Family location: Home  [facility] Provider location: [place]  All persons participating in visit: Pt Brandy Ruiz and Brandy Ruiz  Types of Service: General Behavioral Integrated Care (BHI)  I connected with Brandy Ruiz and/or Brandy Ruiz's n/a via  Telephone or Video Enabled Telemedicine Application  (Video is Caregility application) and verified that I am speaking with the correct person using two identifiers. Discussed confidentiality: Yes   I discussed the limitations of telemedicine and the availability of in person appointments.  Discussed there is a possibility of technology failure and discussed alternative modes of communication if that failure occurs.  I discussed that engaging in this telemedicine visit, they consent to the provision of behavioral healthcare and the services will be billed under their insurance.  Patient and/or legal guardian expressed understanding and consented to Telemedicine visit: Yes   Presenting Concerns: Patient and/or family reports the following symptoms/concerns: stress, difficulty sleepings, irritability, difficulty with focus  Duration of problem: approx 3 months ; Severity of problem: moderate  Patient and/or Family's Strengths/Protective Factors: Concrete supports in place (healthy food, safe environments, etc.)  Goals Addressed: Patient will:  Reduce symptoms of: mood instability and stress sleep issurd   Increase knowledge and/or ability of: coping skills and stress reduction   Demonstrate ability to: Increase healthy adjustment to current life circumstances and Increase adequate support systems for patient/family  Progress towards  Goals: Ongoing  Interventions: Interventions utilized:  Supportive Counseling and Sleep Hygiene Standardized Assessments completed: PHQ 9   Assessment: Patient currently experiencing anxiety affecting pregnancy.   Patient may benefit from outpatient behavioral health.  Plan: Follow up with behavioral health clinician on : as needed  Behavioral recommendations: follow up outpatient therapist, prioritize rest, take prescribed medication as directed.  Referral(s): Counselor  I discussed the assessment and treatment plan with the patient and/or parent/guardian. They were provided an opportunity to ask questions and all were answered. They agreed with the plan and demonstrated an understanding of the instructions.   They were advised to call back or seek an in-person evaluation if the symptoms worsen or if the condition fails to improve as anticipated.  Brandy Saxon, LCSW

## 2021-03-04 ENCOUNTER — Encounter: Payer: Self-pay | Admitting: Advanced Practice Midwife

## 2021-03-06 ENCOUNTER — Encounter: Payer: PRIVATE HEALTH INSURANCE | Admitting: Obstetrics

## 2021-03-18 ENCOUNTER — Other Ambulatory Visit: Payer: Self-pay

## 2021-03-18 ENCOUNTER — Encounter: Payer: Self-pay | Admitting: Obstetrics and Gynecology

## 2021-03-18 ENCOUNTER — Ambulatory Visit (INDEPENDENT_AMBULATORY_CARE_PROVIDER_SITE_OTHER): Payer: PRIVATE HEALTH INSURANCE | Admitting: Obstetrics and Gynecology

## 2021-03-18 VITALS — BP 130/83 | HR 91 | Wt 240.0 lb

## 2021-03-18 DIAGNOSIS — F419 Anxiety disorder, unspecified: Secondary | ICD-10-CM

## 2021-03-18 DIAGNOSIS — Z3481 Encounter for supervision of other normal pregnancy, first trimester: Secondary | ICD-10-CM

## 2021-03-18 DIAGNOSIS — O99340 Other mental disorders complicating pregnancy, unspecified trimester: Secondary | ICD-10-CM

## 2021-03-18 DIAGNOSIS — F319 Bipolar disorder, unspecified: Secondary | ICD-10-CM

## 2021-03-18 NOTE — Addendum Note (Signed)
Addended by: Charlsie Quest B on: 03/18/2021 02:07 PM   Modules accepted: Orders

## 2021-03-18 NOTE — Progress Notes (Signed)
   PRENATAL VISIT NOTE  Subjective:  Brandy Ruiz is a 30 y.o. G2P0010 at [redacted]w[redacted]d being seen today for ongoing prenatal care.  She is currently monitored for the following issues for this low-risk pregnancy and has Encounter for supervision of normal pregnancy in first trimester; Bipolar 1 disorder (HCC); Anxiety during pregnancy; Nausea and vomiting during pregnancy prior to [redacted] weeks gestation; Tobacco smoking affecting pregnancy; and Smoker on their problem list.  Patient reports no complaints.  Contractions: Not present. Vag. Bleeding: None.  Movement: Absent. Denies leaking of fluid.   The following portions of the patient's history were reviewed and updated as appropriate: allergies, current medications, past family history, past medical history, past social history, past surgical history and problem list.   Objective:   Vitals:   03/18/21 1338  BP: 130/83  Pulse: 91  Weight: 240 lb (108.9 kg)    Fetal Status: Fetal Heart Rate (bpm): 150   Movement: Absent     General:  Alert, oriented and cooperative. Patient is in no acute distress.  Skin: Skin is warm and dry. No rash noted.   Cardiovascular: Normal heart rate noted  Respiratory: Normal respiratory effort, no problems with respiration noted  Abdomen: Soft, gravid, appropriate for gestational age.  Pain/Pressure: Absent     Pelvic: Cervical exam deferred        Extremities: Normal range of motion.  Edema: Trace  Mental Status: Normal mood and affect. Normal behavior. Normal judgment and thought content.   Assessment and Plan:  Pregnancy: G2P0010 at [redacted]w[redacted]d 1. Encounter for supervision of other normal pregnancy in first trimester Patient is doing well without complaints Reviewed horizon results Patient referred to genetic counseling Anatomy ultrasound ordered AFP today  2. Bipolar 1 disorder (HCC)   3. Anxiety during pregnancy Patient started Buspar and reports near syncopal episodes Advised to hydrate and consume  a snack with a Buspar to see if these episodes will stop  Preterm labor symptoms and general obstetric precautions including but not limited to vaginal bleeding, contractions, leaking of fluid and fetal movement were reviewed in detail with the patient. Please refer to After Visit Summary for other counseling recommendations.   Return in about 4 weeks (around 04/15/2021) for in person, ROB, Low risk.  No future appointments.  Catalina Antigua, MD

## 2021-03-18 NOTE — Progress Notes (Signed)
No complaints today.

## 2021-03-20 LAB — AFP, SERUM, OPEN SPINA BIFIDA
AFP MoM: 1.19
AFP Value: 33 ng/mL
Gest. Age on Collection Date: 16.4 weeks
Maternal Age At EDD: 29.1 yr
OSBR Risk 1 IN: 6704
Test Results:: NEGATIVE
Weight: 238 [lb_av]

## 2021-04-15 ENCOUNTER — Encounter: Payer: PRIVATE HEALTH INSURANCE | Admitting: Obstetrics and Gynecology

## 2021-04-17 ENCOUNTER — Ambulatory Visit (INDEPENDENT_AMBULATORY_CARE_PROVIDER_SITE_OTHER): Payer: PRIVATE HEALTH INSURANCE | Admitting: Obstetrics

## 2021-04-17 ENCOUNTER — Other Ambulatory Visit: Payer: Self-pay

## 2021-04-17 ENCOUNTER — Encounter: Payer: Self-pay | Admitting: Obstetrics

## 2021-04-17 VITALS — BP 124/74 | HR 80 | Wt 243.0 lb

## 2021-04-17 DIAGNOSIS — K219 Gastro-esophageal reflux disease without esophagitis: Secondary | ICD-10-CM

## 2021-04-17 DIAGNOSIS — F319 Bipolar disorder, unspecified: Secondary | ICD-10-CM

## 2021-04-17 DIAGNOSIS — Z3481 Encounter for supervision of other normal pregnancy, first trimester: Secondary | ICD-10-CM

## 2021-04-17 MED ORDER — PANTOPRAZOLE SODIUM 40 MG PO TBEC: 40.0000 mg | DELAYED_RELEASE_TABLET | Freq: Every day | ORAL | 6 refills | Status: DC

## 2021-04-17 MED ORDER — PREPLUS 27-1 MG PO TABS: 1.0000 | ORAL_TABLET | Freq: Every day | ORAL | 4 refills | Status: DC

## 2021-04-17 NOTE — Progress Notes (Signed)
Subjective:  Brandy Ruiz is a 30 y.o. G2P0010 at [redacted]w[redacted]d being seen today for ongoing prenatal care.  She is currently monitored for the following issues for this low-risk pregnancy and has Encounter for supervision of normal pregnancy in first trimester; Bipolar 1 disorder (HCC); Anxiety during pregnancy; Nausea and vomiting during pregnancy prior to [redacted] weeks gestation; Tobacco smoking affecting pregnancy; and Smoker on their problem list.  Patient reports heartburn.  Contractions: Not present. Vag. Bleeding: None.  Movement: Absent. Denies leaking of fluid.   The following portions of the patient's history were reviewed and updated as appropriate: allergies, current medications, past family history, past medical history, past social history, past surgical history and problem list. Problem list updated.  Objective:   Vitals:   04/17/21 1445  BP: 124/74  Pulse: 80  Weight: 243 lb (110.2 kg)    Fetal Status:     Movement: Absent     General:  Alert, oriented and cooperative. Patient is in no acute distress.  Skin: Skin is warm and dry. No rash noted.   Cardiovascular: Normal heart rate noted  Respiratory: Normal respiratory effort, no problems with respiration noted  Abdomen: Soft, gravid, appropriate for gestational age. Pain/Pressure: Absent     Pelvic:  Cervical exam deferred        Extremities: Normal range of motion.  Edema: None  Mental Status: Normal mood and affect. Normal behavior. Normal judgment and thought content.   Urinalysis:      Assessment and Plan:  Pregnancy: G2P0010 at [redacted]w[redacted]d  1. Encounter for supervision of other normal pregnancy in first trimester Rx: - Prenatal Vit-Fe Fumarate-FA (PREPLUS) 27-1 MG TABS; Take 1 tablet by mouth daily.  Dispense: 90 tablet; Refill: 4  2. Bipolar 1 disorder (HCC) - clinically stable  3. GERD without esophagitis Rx: - pantoprazole (PROTONIX) 40 MG tablet; Take 1 tablet (40 mg total) by mouth daily.  Dispense: 30 tablet;  Refill: 6    Preterm labor symptoms and general obstetric precautions including but not limited to vaginal bleeding, contractions, leaking of fluid and fetal movement were reviewed in detail with the patient. Please refer to After Visit Summary for other counseling recommendations.   Return in about 4 weeks (around 05/15/2021) for ROB.   Brock Bad, MD  04/17/21

## 2021-04-17 NOTE — Progress Notes (Signed)
Pt presents for ROB without complaints today.  

## 2021-04-22 ENCOUNTER — Ambulatory Visit: Payer: PRIVATE HEALTH INSURANCE

## 2021-04-24 ENCOUNTER — Ambulatory Visit (HOSPITAL_BASED_OUTPATIENT_CLINIC_OR_DEPARTMENT_OTHER): Payer: PRIVATE HEALTH INSURANCE

## 2021-04-24 ENCOUNTER — Ambulatory Visit: Payer: Self-pay

## 2021-04-24 ENCOUNTER — Ambulatory Visit: Payer: PRIVATE HEALTH INSURANCE | Admitting: *Deleted

## 2021-04-24 ENCOUNTER — Encounter: Payer: Self-pay | Admitting: *Deleted

## 2021-04-24 ENCOUNTER — Ambulatory Visit: Payer: PRIVATE HEALTH INSURANCE | Attending: Obstetrics and Gynecology

## 2021-04-24 ENCOUNTER — Ambulatory Visit (HOSPITAL_BASED_OUTPATIENT_CLINIC_OR_DEPARTMENT_OTHER): Payer: PRIVATE HEALTH INSURANCE | Admitting: Obstetrics

## 2021-04-24 ENCOUNTER — Other Ambulatory Visit: Payer: Self-pay

## 2021-04-24 VITALS — BP 117/63 | HR 87

## 2021-04-24 DIAGNOSIS — O280 Abnormal hematological finding on antenatal screening of mother: Secondary | ICD-10-CM

## 2021-04-24 DIAGNOSIS — O09892 Supervision of other high risk pregnancies, second trimester: Secondary | ICD-10-CM | POA: Diagnosis not present

## 2021-04-24 DIAGNOSIS — O321XX0 Maternal care for breech presentation, not applicable or unspecified: Secondary | ICD-10-CM

## 2021-04-24 DIAGNOSIS — F319 Bipolar disorder, unspecified: Secondary | ICD-10-CM | POA: Diagnosis not present

## 2021-04-24 DIAGNOSIS — Z148 Genetic carrier of other disease: Secondary | ICD-10-CM

## 2021-04-24 DIAGNOSIS — O99332 Smoking (tobacco) complicating pregnancy, second trimester: Secondary | ICD-10-CM | POA: Diagnosis not present

## 2021-04-24 DIAGNOSIS — F419 Anxiety disorder, unspecified: Secondary | ICD-10-CM | POA: Insufficient documentation

## 2021-04-24 DIAGNOSIS — O99340 Other mental disorders complicating pregnancy, unspecified trimester: Secondary | ICD-10-CM | POA: Insufficient documentation

## 2021-04-24 DIAGNOSIS — D563 Thalassemia minor: Secondary | ICD-10-CM

## 2021-04-24 DIAGNOSIS — Z3A21 21 weeks gestation of pregnancy: Secondary | ICD-10-CM | POA: Diagnosis not present

## 2021-04-24 DIAGNOSIS — Z141 Cystic fibrosis carrier: Secondary | ICD-10-CM

## 2021-04-24 DIAGNOSIS — Z31430 Encounter of female for testing for genetic disease carrier status for procreative management: Secondary | ICD-10-CM

## 2021-04-24 DIAGNOSIS — O99212 Obesity complicating pregnancy, second trimester: Secondary | ICD-10-CM | POA: Diagnosis not present

## 2021-04-24 DIAGNOSIS — E669 Obesity, unspecified: Secondary | ICD-10-CM | POA: Diagnosis not present

## 2021-04-24 DIAGNOSIS — F17210 Nicotine dependence, cigarettes, uncomplicated: Secondary | ICD-10-CM

## 2021-04-24 NOTE — Progress Notes (Signed)
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  Her blood type is O-.  She denies any significant past medical history and denies any problems in her current pregnancy.    She had a cell free DNA test earlier in her pregnancy which indicated a low risk for trisomy 26, 11, and 13.  The patient did not want the fetal gender revealed.  She was informed that the fetal growth and amniotic fluid level were appropriate for her gestational age.   There were no obvious fetal anomalies noted on today's ultrasound exam.  However, today's exam was limited due to the fetal position.  The patient was informed that anomalies may be missed due to technical limitations. If the fetus is in a suboptimal position or maternal habitus is increased, visualization of the fetus in the maternal uterus may be impaired.  The patient was advised that as spinal muscular atrophy, alpha thalassemia, and cystic fibrosis are autosomal recessive conditions, she should have the father of the baby (FOB) screened to determine if he is also a carrier for these conditions.  Should the FOB be a carrier for any of these conditions, there is a 25% chance that her baby will be affected by the disease.  The FOB stated that he is certain that his mother was screened for all these conditions when she was pregnant and was told that it was negative and therefore he could not be a carrier for any of these conditions. He declined to be screened today.  The availability of an amniocentesis for definitive diagnosis of fetal aneuploidy and any of the autosomal recessive conditions that she is a carrier for was discussed.  The couple declined to have an amniocentesis today.    The patient met with our genetic counselor to further discuss the implications of being a carrier for these conditions.  As her blood type is  Rh-, she was advised that she will require shot of RhoGAM at 28 weeks, after delivery, and at any time should she experience vaginal bleeding.  A follow-up exam was scheduled in 4 weeks to assess the fetal growth and to complete the views of the fetal anatomy.  The patient stated that all of their questions have been answered.  A total of 30 minutes was spent counseling and coordinating the care for this patient.  Greater than 50% of the time was spent in direct face-to-face contact.

## 2021-04-24 NOTE — Progress Notes (Addendum)
Referring provider:  Catalina Antigua Length of consultation:  45 minutes  Ms. 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  We reviewed those results with the patient and her partner, Irven Coe, who was present with her at this visit.   Spinal muscular atrophy (SMA) SMA is a recessive genetic condition with variable age of onset and severity caused by mutations in the SMN1 gene.  To review, a recessive condition occurs in a child when both the mother and the father are carriers of the genetic condition and both pass on that altered gene to the child.  The features of SMA are caused by the loss of motor neurons which leads to progressive muscle weakness and muscle wasting (atrophy) in infancy.  There are several types of SMA based upon severity, but in the most common type (type 1), children may pass away as early as 76 years of age.  Carrier testing assesses the number of copies of the SMN1 gene, from zero to four.  Persons with one copy of the SMN1 gene are carriers, and those with no copies are affected with the condition.  Individuals with two or more copies have a reduced chance to be a carrier.  Persons with 3 or 4 copies of this gene are highly unlikely to be carriers.  However, someone with two copies of the gene can either have two copies of this gene on separate chromosomes and thus not be a carrier or they could have two copies of this gene on the same chromosome with no copies on the other chromosome and be a "silent" carrier.  This silent carrier status is known to be more common in persons of African American or Ashkenazi Jewish ancestry, particularly when they are found to be positive for a variant in the gene known as c. *3+80T>G. The  results revealed that Ms. 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  This testing cannot confirm or eliminate the chance to have a child with SMA. It is estimated that current carrier screening can detect 94.8% of carriers in the Caucasian population and 70.5% of carriers in the African American population.    Then next option we reviewed is to have her partner tested to determine his status.  If he is found to be a carrier, then the pregnancy may be at increased risk for SMA and testing would be offered during the pregnancy through amniocentesis or at the time of birth. At this time, prior to testing, Christen Bame has a chance of 1 in 63 to be a carrier given his African American ancestry and no known family history of SMA.  Overall, the chance for this couple to have a child with SMA at this time is 1/72 X 1/34 X = 1 in 9,792. If he were to have testing that showed a low chance for him to be a carrier, this number could be decreased significantly.  If his testing were to show that he is a carrier, then the number would be increased.   Cystic fibrosis (CF) CF is a condition characterized by the buildup of thick, sticky mucus that can damage  the body's organs. Mucus lubricates and protects the linings of the airways, digestive system, reproductive system, and other organs and tissues. Individuals with CF have abnormally sticky mucus that cannot easily be cleared from the airways and digestive system, leading to progressive damage to the respiratory system and chronic digestive system problems. The most common features of CF include respiratory difficulties, bacterial infections in the lungs, the formation of scar tissue (fibrosis) and cysts in the lungs, pancreatic insufficiency, CF-related diabetes mellitus, diarrhea, malnutrition, poor growth, and weight loss. Most men with CF have congenital bilateral absence of  the vas deferens (CBAVD) which causes female infertility. With therapies, such as daily respiratory therapies and medications to aid digestion, the median lifespan for people with CF is now in their 40's. Treatment may involve lung transplantation and CFTR protein modulators in some cases. CF is variably expressed, meaning features of the condition and their severity vary among affected individuals. Expression and severity of CF depends upon the specific mutations present in an affected individual.   CF is caused by mutations in the CFTR gene. This gene provides instructions for a channel that transports chloride ions into and out of cells. The flow of chloride ions helps control the movement of water in the body's tissues, which is necessary for the production of thin, freely flowing mucus. Pathogenic variants in the CFTR gene disrupt the function of the chloride channels, preventing them from regulating the flow of chloride ions and water across cell membranes. Ms. 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  CF is also inherited in an autosomal recessive fashion. This means that the current fetus is only at risk for CF if Christen Bame is also a carrier for the condition. Based on the carrier frequency for CF in the African American population, Christen Bame has a 1 in 65 chance of being a carrier for CF. Thus, the couple currently has a 1 in 260 (0.05%) chance of having a child with CF. If Milon were to be identified to be a carrier, the risk for CF in the pregnancy would be 1 in 4 (25%). If both members of a couple are known to be carriers, then diagnostic testing is available during pregnancy to determine if the fetus is affected.    Alpha thalassemia Silent Carrier The results of the screen identified her as a silent carrier for alpha-thalassemia (aa/a-). Alpha-thalassemia is different in its inheritance compared to other hemoglobinopathies  as there are two copies of two alpha globin genes (HBA1 and HBA2) on each chromosome 16, or four alpha globin genes total (aa/aa). A person can be a carrier of one alpha gene mutation (aa/a-), also referred to as a "silent carrier". A person who carries two alpha globin gene mutations can either carry them in cis (both on the same chromosome, denoted as aa/--) or in trans (on different chromosomes, denoted as a-/a-). Alpha-thalassemia carriers of two mutations who have African American ancestry are more likely to have a trans arrangement (a-/a-); cis configuration is reported to be rare in individuals with African American ancestry.     There are several different forms of alpha-thalassemia. The most severe form of alpha-thalassemia, Hb Barts, is associated with an absence of alpha globin chain synthesis as a result of deletions of all four alpha globin genes (--/--).  Given that Ms. Vest is a silent carrier (aa/a-), her pregnancies would not be at increased risk for Hb Barts, even if her partner is  a carrier for alpha-thalassemia, as she will always pass on at least one copy of the alpha globin gene to her children. Hemoglobin H (HbH) disease is caused by three deleted or dysfunctioning alpha globin alleles (a-/--) and is characterized by microcytic hypochromic hemolytic anemia, hepatosplenomegaly, mild jaundice, growth retardation, and sometimes thalassemia-like bone changes. Given Ms. Munnerlyn's silent carrier status (aa/a-), the current fetus would only be at risk for HbH disease (a-/--), if her partner is a carrier for two alpha globin mutations in cis (aa/--). If this is the case, the risk for HbH disease in the pregnancy would be 1 in 4 (25%). However, if Christen BameMilon is a carrier for two alpha globin mutations, he would be more likely to carry them in trans configuration (a-/a-) than the cis configuration (aa/--), given his ethnicity. If he is a carrier of alpha-thalassemia in trans, then the pregnancy  would not be at increased risk for HbH disease and would at most be a carrier of two gene changes in trans (a-/a-).  Carriers may have mild anemia, or small red blood cells (low MCV on CBC), but are expected to be healthy. Based on the carrier frequency for alpha-thalassemia in the African American population, Ms. Affeldt's partner has a 1 in 30 chance of being any type of carrier for alpha-thalassemia.   We also talked about the possibility of testing at the time of delivery for CF, SMA and alpha thalassemia. All newborn babies in [place] are tested for CF and SMA as well as numerous other conditions as part of state mandated newborn screening. Results from newborn screening are typically available 2 weeks after the baby is born. Testing on cord blood or of the baby sometime after delivery could also be ordered if desired.  We then obtained a detailed family history and pregnancy history.  This is the second pregnancy for this couple.  The first ended in early miscarriage.  Milon had one previous child, a daughter who passed away as an infant in a car accident. In the family history, the patient reported a personal history of bipolar disorder as well as in her sister and possibly her father.  We reviewed that while we do not fully understand the causes of mental health conditions, there are clearly strong inherited factors in many families.  Other factors such as life events or exposures could play a role as well. It is important to be mindful of this history and likely increased risk for mental health conditions in this baby as well as other family members.  Milon stated two paternal relatives (a niece and great niece/nephew) with autism and multiple birth marks.  He was not sure of the name for the condition but was told it was inherited from that child's mothers family.  Without more information, an accurate recurrence risk assessment is difficult.  Based upon the description we did discuss neurofibromatosis as  a possible condition with dominant inheritance that includes skin findings as well as intellectual differences.  If inherited from their mother, who is not [facility], then this pregnancy is not at risk.  He was encouraged to reach out if more information is learned. The remainder of the family history was unremarkable for birth defects, intellectual disabilities, muscle weakness conditions, early death or known genetic conditions. The patient reported no complications or exposures in this pregnancy that would be expected to increase the risk for birth defects.  Thank you for allowing [facility] to be involved in the care of this patient.  We encouraged her to call with any questions or concerns as they arise.  We may be reached at [phone].  Plan of care:  The patient and her partner declined testing on Milon for these conditions.  They expressed that they would not consider invasive prenatal diagnosis regardless of his results. He stated that his parents said they were tested for these prior to his birth, though we reviewed that this is unlikely given his age (30yo) and available testing at that time.  Perhaps they had screening for sickle cell disease, but we do not have documentation of that testing. The patient plans to follow up with newborn screening results at the time of delivery.   Cherly Anderson, MS, CGC

## 2021-04-25 ENCOUNTER — Other Ambulatory Visit: Payer: Self-pay | Admitting: *Deleted

## 2021-04-25 DIAGNOSIS — O99332 Smoking (tobacco) complicating pregnancy, second trimester: Secondary | ICD-10-CM

## 2021-04-25 DIAGNOSIS — Z6836 Body mass index (BMI) 36.0-36.9, adult: Secondary | ICD-10-CM

## 2021-05-15 ENCOUNTER — Other Ambulatory Visit: Payer: Self-pay

## 2021-05-15 ENCOUNTER — Ambulatory Visit (INDEPENDENT_AMBULATORY_CARE_PROVIDER_SITE_OTHER): Payer: PRIVATE HEALTH INSURANCE | Admitting: Obstetrics and Gynecology

## 2021-05-15 DIAGNOSIS — Z3481 Encounter for supervision of other normal pregnancy, first trimester: Secondary | ICD-10-CM

## 2021-05-15 NOTE — Progress Notes (Signed)
Pt reports fetal movement with occasional pressure.  

## 2021-05-15 NOTE — Patient Instructions (Signed)

## 2021-05-15 NOTE — Progress Notes (Signed)
   PRENATAL VISIT NOTE  Subjective:  Brandy Ruiz is a 30 y.o. G2P0010 at [redacted]w[redacted]d being seen today for ongoing prenatal care.  She is currently monitored for the following issues for this low-risk pregnancy and has Encounter for supervision of normal pregnancy in first trimester; Bipolar 1 disorder (HCC); Anxiety during pregnancy; Nausea and vomiting during pregnancy prior to [redacted] weeks gestation; Tobacco smoking affecting pregnancy; and Smoker on their problem list.  Patient reports no complaints.  Contractions: Not present. Vag. Bleeding: None.  Movement: Present. Denies leaking of fluid.   The following portions of the patient's history were reviewed and updated as appropriate: allergies, current medications, past family history, past medical history, past social history, past surgical history and problem list.   Objective:   Vitals:   05/15/21 1615  BP: 129/83  Pulse: 92  Weight: 246 lb 9.6 oz (111.9 kg)    Fetal Status: Fetal Heart Rate (bpm): 144 Fundal Height: 25 cm Movement: Present     General:  Alert, oriented and cooperative. Patient is in no acute distress.  Skin: Skin is warm and dry. No rash noted.   Cardiovascular: Normal heart rate noted  Respiratory: Normal respiratory effort, no problems with respiration noted  Abdomen: Soft, gravid, appropriate for gestational age.  Pain/Pressure: Present     Pelvic: Cervical exam deferred        Extremities: Normal range of motion.  Edema: None  Mental Status: Normal mood and affect. Normal behavior. Normal judgment and thought content.   Assessment and Plan:  Pregnancy: G2P0010 at [redacted]w[redacted]d  1. Encounter for supervision of other normal pregnancy in first trimester  Doing well  MFM [place] reviewed 2 hour glucose test next visit. Discussed this.   Preterm labor symptoms and general obstetric precautions including but not limited to vaginal bleeding, contractions, leaking of fluid and fetal movement were reviewed in detail with the  patient. Please refer to After Visit Summary for other counseling recommendations.   Return in about 4 weeks (around 06/12/2021) for for 2 hour GTT.  Future Appointments  Date Time Provider Department Center  05/27/2021  2:45 PM [facility] NURSE [facility]  05/27/2021  3:00 PM [facility] US1 [facility]  06/13/2021  8:00 AM [facility] LAB [facility] None  06/13/2021  8:55 AM [facility] Hong, Carla Drape, CNM [facility] None    Venia Carbon, NP

## 2021-05-16 ENCOUNTER — Telehealth: Payer: Self-pay | Admitting: *Deleted

## 2021-05-16 NOTE — Telephone Encounter (Signed)
Pt called to office and was unsure what OTC med she should take after reviewing safe med list. Pt had questions about Sudafed.  Pt advised she may take Sudafed as long as it did not contain PE(phenylephrine).  Pt also made aware she may take OTC allergy meds as needed.

## 2021-05-27 ENCOUNTER — Encounter: Payer: Self-pay | Admitting: *Deleted

## 2021-05-27 ENCOUNTER — Other Ambulatory Visit: Payer: Self-pay

## 2021-05-27 ENCOUNTER — Ambulatory Visit: Payer: PRIVATE HEALTH INSURANCE | Admitting: *Deleted

## 2021-05-27 ENCOUNTER — Ambulatory Visit: Payer: PRIVATE HEALTH INSURANCE | Attending: Obstetrics

## 2021-05-27 VITALS — BP 142/75 | HR 94

## 2021-05-27 DIAGNOSIS — Z148 Genetic carrier of other disease: Secondary | ICD-10-CM | POA: Diagnosis not present

## 2021-05-27 DIAGNOSIS — O99332 Smoking (tobacco) complicating pregnancy, second trimester: Secondary | ICD-10-CM

## 2021-05-27 DIAGNOSIS — Z3A26 26 weeks gestation of pregnancy: Secondary | ICD-10-CM | POA: Diagnosis not present

## 2021-05-27 DIAGNOSIS — Z3481 Encounter for supervision of other normal pregnancy, first trimester: Secondary | ICD-10-CM | POA: Insufficient documentation

## 2021-05-27 DIAGNOSIS — E669 Obesity, unspecified: Secondary | ICD-10-CM | POA: Diagnosis not present

## 2021-05-27 DIAGNOSIS — Z6836 Body mass index (BMI) 36.0-36.9, adult: Secondary | ICD-10-CM

## 2021-05-27 DIAGNOSIS — O99212 Obesity complicating pregnancy, second trimester: Secondary | ICD-10-CM | POA: Diagnosis not present

## 2021-05-28 ENCOUNTER — Other Ambulatory Visit: Payer: Self-pay | Admitting: *Deleted

## 2021-05-28 DIAGNOSIS — O43199 Other malformation of placenta, unspecified trimester: Secondary | ICD-10-CM

## 2021-05-28 DIAGNOSIS — Z6836 Body mass index (BMI) 36.0-36.9, adult: Secondary | ICD-10-CM

## 2021-06-13 ENCOUNTER — Ambulatory Visit (INDEPENDENT_AMBULATORY_CARE_PROVIDER_SITE_OTHER): Payer: PRIVATE HEALTH INSURANCE

## 2021-06-13 ENCOUNTER — Other Ambulatory Visit: Payer: Self-pay

## 2021-06-13 ENCOUNTER — Other Ambulatory Visit: Payer: PRIVATE HEALTH INSURANCE

## 2021-06-13 VITALS — BP 140/83 | HR 88 | Wt 243.0 lb

## 2021-06-13 DIAGNOSIS — Z3481 Encounter for supervision of other normal pregnancy, first trimester: Secondary | ICD-10-CM

## 2021-06-13 DIAGNOSIS — F319 Bipolar disorder, unspecified: Secondary | ICD-10-CM

## 2021-06-13 DIAGNOSIS — O219 Vomiting of pregnancy, unspecified: Secondary | ICD-10-CM

## 2021-06-13 DIAGNOSIS — O360930 Maternal care for other rhesus isoimmunization, third trimester, not applicable or unspecified: Secondary | ICD-10-CM | POA: Diagnosis not present

## 2021-06-13 DIAGNOSIS — Z3403 Encounter for supervision of normal first pregnancy, third trimester: Secondary | ICD-10-CM

## 2021-06-13 DIAGNOSIS — O26899 Other specified pregnancy related conditions, unspecified trimester: Secondary | ICD-10-CM

## 2021-06-13 DIAGNOSIS — Z6791 Unspecified blood type, Rh negative: Secondary | ICD-10-CM

## 2021-06-13 DIAGNOSIS — Z3A29 29 weeks gestation of pregnancy: Secondary | ICD-10-CM | POA: Diagnosis not present

## 2021-06-13 MED ORDER — RHO D IMMUNE GLOBULIN 1500 UNIT/2ML IJ SOSY
300.0000 ug | PREFILLED_SYRINGE | Freq: Once | INTRAMUSCULAR | Status: AC
  Administered 2021-06-13: 300 ug via INTRAMUSCULAR

## 2021-06-13 MED ORDER — ONDANSETRON 4 MG PO TBDP: 4.0000 mg | ORAL_TABLET | Freq: Four times a day (QID) | ORAL | 2 refills | Status: DC | PRN

## 2021-06-13 NOTE — Progress Notes (Signed)
   PRENATAL VISIT NOTE  Subjective:  Brandy Ruiz is a 30 y.o. G2P0010 at [redacted]w[redacted]d being seen today for ongoing prenatal care.  She is currently monitored for the following issues for this low-risk pregnancy and has Encounter for supervision of normal pregnancy in first trimester; Bipolar 1 disorder (HCC); Anxiety during pregnancy; Nausea and vomiting during pregnancy prior to [redacted] weeks gestation; Tobacco smoking affecting pregnancy; and Smoker on their problem list.  Patient reports no complaints.  Contractions: Not present. Vag. Bleeding: None.  Movement: Present. Denies leaking of fluid.   The following portions of the patient's history were reviewed and updated as appropriate: allergies, current medications, past family history, past medical history, past social history, past surgical history and problem list.   Objective:   Vitals:   06/13/21 0826  BP: 140/83  Pulse: 88  Weight: 243 lb (110.2 kg)    Fetal Status: Fetal Heart Rate (bpm): 140 Fundal Height: 30 cm Movement: Present     General:  Alert, oriented and cooperative. Patient is in no acute distress.  Skin: Skin is warm and dry. No rash noted.   Cardiovascular: Normal heart rate noted  Respiratory: Normal respiratory effort, no problems with respiration noted  Abdomen: Soft, gravid, appropriate for gestational age.  Pain/Pressure: Absent     Pelvic: Cervical exam deferred        Extremities: Normal range of motion.     Mental Status: Normal mood and affect. Normal behavior. Normal judgment and thought content.   Assessment and Plan:  Pregnancy: G2P0010 at [redacted]w[redacted]d  1. Encounter for supervision of normal first pregnancy in third trimester - Routine OB care. Doing well. No concerns - Anticipatory guidance for upcoming appointments provided - Patient vomited prior to having blood work drawn. Will return on Monday for lab only visit.    2. Rh negative state in antepartum period - No bleeding. Rhogam today  - rho (d)  immune globulin (RHIG/RHOPHYLAC) injection 300 mcg  3. Bipolar 1 disorder (HCC) - Stable  4. Elevated BP without diagnosis of hypertension - Asymptomatic - No prior BP's elevated - Patient to have CMP and UPCR with GTT and 28 week labs on Monday   Preterm labor symptoms and general obstetric precautions including but not limited to vaginal bleeding, contractions, leaking of fluid and fetal movement were reviewed in detail with the patient. Please refer to After Visit Summary for other counseling recommendations.   Return in about 2 weeks (around 06/27/2021).  Future Appointments  Date Time Provider Department Center  06/16/2021  8:00 AM [facility] LAB [facility] None  06/24/2021  1:30 PM [facility] NURSE [facility]  06/24/2021  1:45 PM [facility] US5 [facility]  06/26/2021 11:15 AM Brock Bad, MD [facility] None     Brand Males, CNM 06/13/21 10:48 AM

## 2021-06-14 LAB — CBC
Hematocrit: 37.3 % (ref 34.0–46.6)
Hemoglobin: 11.9 g/dL (ref 11.1–15.9)
MCH: 27.5 pg (ref 26.6–33.0)
MCHC: 31.9 g/dL (ref 31.5–35.7)
MCV: 86 fL (ref 79–97)
Platelets: 175 10*3/uL (ref 150–450)
RBC: 4.32 x10E6/uL (ref 3.77–5.28)
RDW: 13.9 % (ref 11.7–15.4)
WBC: 12.8 10*3/uL — ABNORMAL HIGH (ref 3.4–10.8)

## 2021-06-14 LAB — COMPREHENSIVE METABOLIC PANEL
ALT: 10 IU/L (ref 0–32)
AST: 13 IU/L (ref 0–40)
Albumin/Globulin Ratio: 1.7 (ref 1.2–2.2)
Albumin: 3.8 g/dL — ABNORMAL LOW (ref 3.9–5.0)
Alkaline Phosphatase: 89 IU/L (ref 44–121)
BUN/Creatinine Ratio: 11 (ref 9–23)
BUN: 6 mg/dL (ref 6–20)
Bilirubin Total: 0.3 mg/dL (ref 0.0–1.2)
CO2: 20 mmol/L (ref 20–29)
Calcium: 8.9 mg/dL (ref 8.7–10.2)
Chloride: 104 mmol/L (ref 96–106)
Creatinine, Ser: 0.53 mg/dL — ABNORMAL LOW (ref 0.57–1.00)
Globulin, Total: 2.2 g/dL (ref 1.5–4.5)
Glucose: 78 mg/dL (ref 70–99)
Potassium: 3.7 mmol/L (ref 3.5–5.2)
Sodium: 139 mmol/L (ref 134–144)
Total Protein: 6 g/dL (ref 6.0–8.5)
eGFR: 129 mL/min/{1.73_m2} (ref >59)

## 2021-06-14 LAB — HIV ANTIBODY (ROUTINE TESTING W REFLEX): HIV Screen 4th Generation wRfx: NONREACTIVE

## 2021-06-14 LAB — RPR: RPR Ser Ql: NONREACTIVE

## 2021-06-16 ENCOUNTER — Other Ambulatory Visit: Payer: Self-pay

## 2021-06-16 ENCOUNTER — Other Ambulatory Visit: Payer: PRIVATE HEALTH INSURANCE

## 2021-06-16 DIAGNOSIS — Z3403 Encounter for supervision of normal first pregnancy, third trimester: Secondary | ICD-10-CM

## 2021-06-17 LAB — GLUCOSE TOLERANCE, 2 HOURS W/ 1HR
Glucose, 1 hour: 147 mg/dL (ref 70–179)
Glucose, 2 hour: 110 mg/dL (ref 70–152)
Glucose, Fasting: 78 mg/dL (ref 70–91)

## 2021-06-24 ENCOUNTER — Ambulatory Visit: Payer: PRIVATE HEALTH INSURANCE | Attending: Obstetrics and Gynecology

## 2021-06-24 ENCOUNTER — Ambulatory Visit: Payer: PRIVATE HEALTH INSURANCE | Admitting: *Deleted

## 2021-06-24 ENCOUNTER — Encounter: Payer: Self-pay | Admitting: *Deleted

## 2021-06-24 ENCOUNTER — Other Ambulatory Visit: Payer: Self-pay

## 2021-06-24 VITALS — BP 138/73 | HR 92

## 2021-06-24 DIAGNOSIS — O99333 Smoking (tobacco) complicating pregnancy, third trimester: Secondary | ICD-10-CM | POA: Diagnosis not present

## 2021-06-24 DIAGNOSIS — Z148 Genetic carrier of other disease: Secondary | ICD-10-CM | POA: Diagnosis not present

## 2021-06-24 DIAGNOSIS — O99213 Obesity complicating pregnancy, third trimester: Secondary | ICD-10-CM | POA: Diagnosis not present

## 2021-06-24 DIAGNOSIS — Z3481 Encounter for supervision of other normal pregnancy, first trimester: Secondary | ICD-10-CM

## 2021-06-24 DIAGNOSIS — O43193 Other malformation of placenta, third trimester: Secondary | ICD-10-CM | POA: Insufficient documentation

## 2021-06-24 DIAGNOSIS — Z3483 Encounter for supervision of other normal pregnancy, third trimester: Secondary | ICD-10-CM | POA: Insufficient documentation

## 2021-06-24 DIAGNOSIS — E669 Obesity, unspecified: Secondary | ICD-10-CM | POA: Diagnosis not present

## 2021-06-24 DIAGNOSIS — Z3A30 30 weeks gestation of pregnancy: Secondary | ICD-10-CM | POA: Insufficient documentation

## 2021-06-24 DIAGNOSIS — Z6836 Body mass index (BMI) 36.0-36.9, adult: Secondary | ICD-10-CM | POA: Diagnosis present

## 2021-06-24 DIAGNOSIS — O43199 Other malformation of placenta, unspecified trimester: Secondary | ICD-10-CM

## 2021-06-25 ENCOUNTER — Other Ambulatory Visit: Payer: Self-pay | Admitting: *Deleted

## 2021-06-25 DIAGNOSIS — Z6836 Body mass index (BMI) 36.0-36.9, adult: Secondary | ICD-10-CM

## 2021-06-25 DIAGNOSIS — O99333 Smoking (tobacco) complicating pregnancy, third trimester: Secondary | ICD-10-CM

## 2021-06-25 DIAGNOSIS — O43199 Other malformation of placenta, unspecified trimester: Secondary | ICD-10-CM

## 2021-06-26 ENCOUNTER — Encounter: Payer: PRIVATE HEALTH INSURANCE | Admitting: Obstetrics

## 2021-07-08 ENCOUNTER — Other Ambulatory Visit: Payer: Self-pay

## 2021-07-08 ENCOUNTER — Encounter: Payer: Self-pay | Admitting: Obstetrics

## 2021-07-08 ENCOUNTER — Ambulatory Visit (INDEPENDENT_AMBULATORY_CARE_PROVIDER_SITE_OTHER): Payer: PRIVATE HEALTH INSURANCE | Admitting: Obstetrics

## 2021-07-08 VITALS — BP 133/77 | HR 72 | Wt 245.0 lb

## 2021-07-08 DIAGNOSIS — Z3403 Encounter for supervision of normal first pregnancy, third trimester: Secondary | ICD-10-CM

## 2021-07-08 NOTE — Progress Notes (Signed)
Subjective:  Brandy Ruiz is a 30 y.o. G2P0010 at [redacted]w[redacted]d being seen today for ongoing prenatal care.  She is currently monitored for the following issues for this low-risk pregnancy and has Encounter for supervision of normal pregnancy in first trimester; Bipolar 1 disorder (HCC); Anxiety during pregnancy; Nausea and vomiting during pregnancy prior to [redacted] weeks gestation; Tobacco smoking affecting pregnancy; and Smoker on their problem list.  Patient reports no complaints.  Contractions: Not present. Vag. Bleeding: None.  Movement: Present. Denies leaking of fluid.   The following portions of the patient's history were reviewed and updated as appropriate: allergies, current medications, past family history, past medical history, past social history, past surgical history and problem list. Problem list updated.  Objective:   Vitals:   07/08/21 1402 07/08/21 1425  BP: (!) 141/80 133/77  Pulse: 80 72  Weight: 245 lb (111.1 kg)     Fetal Status:     Movement: Present     General:  Alert, oriented and cooperative. Patient is in no acute distress.  Skin: Skin is warm and dry. No rash noted.   Cardiovascular: Normal heart rate noted  Respiratory: Normal respiratory effort, no problems with respiration noted  Abdomen: Soft, gravid, appropriate for gestational age. Pain/Pressure: Absent     Pelvic:  Cervical exam deferred        Extremities: Normal range of motion.  Edema: None  Mental Status: Normal mood and affect. Normal behavior. Normal judgment and thought content.   Urinalysis:      Assessment and Plan:  Pregnancy: G2P0010 at [redacted]w[redacted]d  There are no diagnoses linked to this encounter. Preterm labor symptoms and general obstetric precautions including but not limited to vaginal bleeding, contractions, leaking of fluid and fetal movement were reviewed in detail with the patient. Please refer to After Visit Summary for other counseling recommendations.   Return in about 2 weeks (around  07/22/2021) for ROB.   Brock Bad, MD  07/08/21

## 2021-07-22 ENCOUNTER — Other Ambulatory Visit: Payer: Self-pay

## 2021-07-22 ENCOUNTER — Encounter: Payer: Self-pay | Admitting: *Deleted

## 2021-07-22 ENCOUNTER — Ambulatory Visit: Payer: PRIVATE HEALTH INSURANCE | Admitting: *Deleted

## 2021-07-22 ENCOUNTER — Ambulatory Visit: Payer: PRIVATE HEALTH INSURANCE | Attending: Obstetrics and Gynecology

## 2021-07-22 VITALS — BP 134/74 | HR 83

## 2021-07-22 DIAGNOSIS — O99333 Smoking (tobacco) complicating pregnancy, third trimester: Secondary | ICD-10-CM

## 2021-07-22 DIAGNOSIS — O43199 Other malformation of placenta, unspecified trimester: Secondary | ICD-10-CM | POA: Diagnosis present

## 2021-07-22 DIAGNOSIS — Z3A34 34 weeks gestation of pregnancy: Secondary | ICD-10-CM

## 2021-07-22 DIAGNOSIS — Z6836 Body mass index (BMI) 36.0-36.9, adult: Secondary | ICD-10-CM | POA: Diagnosis present

## 2021-07-22 DIAGNOSIS — Z3481 Encounter for supervision of other normal pregnancy, first trimester: Secondary | ICD-10-CM | POA: Diagnosis present

## 2021-07-22 DIAGNOSIS — O99212 Obesity complicating pregnancy, second trimester: Secondary | ICD-10-CM | POA: Insufficient documentation

## 2021-07-22 DIAGNOSIS — Z148 Genetic carrier of other disease: Secondary | ICD-10-CM | POA: Diagnosis not present

## 2021-07-22 DIAGNOSIS — O43193 Other malformation of placenta, third trimester: Secondary | ICD-10-CM | POA: Diagnosis not present

## 2021-07-22 DIAGNOSIS — E669 Obesity, unspecified: Secondary | ICD-10-CM | POA: Diagnosis not present

## 2021-07-23 ENCOUNTER — Ambulatory Visit (INDEPENDENT_AMBULATORY_CARE_PROVIDER_SITE_OTHER): Payer: PRIVATE HEALTH INSURANCE | Admitting: Women's Health

## 2021-07-23 VITALS — BP 133/83 | HR 85 | Wt 245.0 lb

## 2021-07-23 DIAGNOSIS — Z3A34 34 weeks gestation of pregnancy: Secondary | ICD-10-CM | POA: Insufficient documentation

## 2021-07-23 DIAGNOSIS — F319 Bipolar disorder, unspecified: Secondary | ICD-10-CM

## 2021-07-23 DIAGNOSIS — O99340 Other mental disorders complicating pregnancy, unspecified trimester: Secondary | ICD-10-CM

## 2021-07-23 DIAGNOSIS — F419 Anxiety disorder, unspecified: Secondary | ICD-10-CM

## 2021-07-23 DIAGNOSIS — O99333 Smoking (tobacco) complicating pregnancy, third trimester: Secondary | ICD-10-CM

## 2021-07-23 DIAGNOSIS — Z6791 Unspecified blood type, Rh negative: Secondary | ICD-10-CM

## 2021-07-23 DIAGNOSIS — Z3481 Encounter for supervision of other normal pregnancy, first trimester: Secondary | ICD-10-CM

## 2021-07-23 DIAGNOSIS — O26899 Other specified pregnancy related conditions, unspecified trimester: Secondary | ICD-10-CM

## 2021-07-23 NOTE — Patient Instructions (Addendum)
Maternity Assessment Unit (MAU)  The Maternity Assessment Unit (MAU) is located at the [facility]. The address is: [address], [address], [place], [place] [address]. Please see map below for additional directions.    The Maternity Assessment Unit is designed to help you during your pregnancy, and for up to 6 weeks after delivery, with any pregnancy- or postpartum-related emergencies, if you think you are in labor, or if your water has broken. For example, if you experience nausea and vomiting, vaginal bleeding, severe abdominal or pelvic pain, elevated blood pressure or other problems related to your pregnancy or postpartum time, please come to the Maternity Assessment Unit for assistance.       Preterm Labor The normal length of a pregnancy is 39-41 weeks. Preterm labor is when labor starts before 37 completed weeks of pregnancy. Babies who are born prematurely and survive may not be fully developed and may be at an increased risk for long-term problems such as cerebral palsy, developmental delays, and vision and hearing problems. Babies who are born too early may have problems soon after birth. Premature babies may have problems regulating blood sugar, body temperature, heart rate, and breathing rate. These babies often have trouble with feeding. The risk of having problems is highest for babies who are born before 34 weeks of pregnancy. What are the causes? The exact cause of this condition is not known. What increases the risk? You are more likely to have preterm labor if you have certain risk factors that relate to your medical history, problems with present and past pregnancies, and lifestyle factors. Medical history You have abnormalities of the uterus, including a short cervix. You have STIs (sexually transmitted infections) or other infections of the urinary tract and the vagina. You have chronic illnesses, such as blood clotting  problems, diabetes, or high blood pressure. You are overweight or underweight. Present and past pregnancies You have had preterm labor before. You are pregnant with twins or other multiples. You have been diagnosed with a condition in which the placenta covers your cervix (placenta previa). You waited less than 18 months between giving birth and becoming pregnant again. Your unborn baby has some abnormalities. You have vaginal bleeding during pregnancy. You became pregnant through in vitro fertilization (IVF). Lifestyle and environmental factors You use tobacco products or drink alcohol. You use drugs. You have stress and no social support. You experience domestic violence. You are exposed to certain chemicals or environmental pollutants. Other factors You are younger than age 17 or older than age 35. What are the signs or symptoms? Symptoms of this condition include: Cramps similar to those that can happen during a menstrual period. The cramps may happen with diarrhea. Pain in the abdomen or lower back. Regular contractions that may feel like tightening of the abdomen. A feeling of increased pressure in the pelvis. Increased watery or bloody mucus discharge from the vagina. Water breaking (ruptured amniotic sac). How is this diagnosed? This condition is diagnosed based on: Your medical history and a physical exam. A pelvic exam. An ultrasound. Monitoring your uterus for contractions. Other tests, including: A swab of the cervix to check for a chemical called fetal fibronectin. Urine tests. How is this treated? Treatment for this condition depends on the length of your pregnancy, your condition, and the health of your baby. Treatment may include: Taking medicines, such as: Hormone medicines. These may be given early in pregnancy to help support the pregnancy. Medicines to stop   contractions. Medicines to help mature the baby's lungs. These may be prescribed if the risk of  delivery is high. Medicines to help protect your baby from brain and nerve complications such as cerebral palsy. Bed rest. If the labor happens before 34 weeks of pregnancy, you may need to stay in the hospital. Delivery of the baby. Follow these instructions at home:  Do not use any products that contain nicotine or tobacco. These products include cigarettes, chewing tobacco, and vaping devices, such as e-cigarettes. If you need help quitting, ask your health care provider. Do not drink alcohol. Take over-the-counter and prescription medicines only as told by your health care provider. Rest as told by your health care provider. Return to your normal activities as told by your health care provider. Ask your health care provider what activities are safe for you. Keep all follow-up visits. This is important. How is this prevented? To increase your chance of having a full-term pregnancy: Do not use drugs or take medicines that have not been prescribed to you during your pregnancy. Talk with your health care provider before taking any herbal supplements, even if you have been taking them regularly. Make sure you gain a healthy amount of weight during your pregnancy. Watch for infection. If you think that you might have an infection, get it checked right away. Symptoms of infection may include: Fever. Abnormal vaginal discharge or discharge that smells bad. Pain or burning with urination. Needing to urinate urgently. Frequently urinating or passing small amounts of urine frequently. Blood in your urine or urine that smells bad or unusual. Where to find more information [place] Department of Health and Human Services Office on Women's Health: www.womenshealth.gov The American College of Obstetricians and Gynecologists: www.acog.org Centers for Disease Control and Prevention, Preterm Birth: www.cdc.gov Contact a health care provider if: You think you are going into preterm labor. You have signs  or symptoms of preterm labor. You have symptoms of infection. Get help right away if: You are having regular, painful contractions every 5 minutes or less. Your water breaks. Summary Preterm labor is labor that starts before you reach 37 weeks of pregnancy. Delivering your baby early increases your baby's risk of developing long-term problems. You are more likely to have preterm labor if you have certain risk factors that relate to your medical history, problems with present and past pregnancies, and lifestyle factors. Keep all follow-up visits. This is important. Contact a health care provider if you have signs or symptoms of preterm labor. This information is not intended to replace advice given to you by your health care provider. Make sure you discuss any questions you have with your health care provider. Document Revised: 08/27/2020 Document Reviewed: 08/27/2020 Elsevier Patient Education  2022 Elsevier Inc.       Group B Streptococcus Test During Pregnancy Why am I having this test? Routine testing, also called screening, for group B streptococcus (GBS) is recommended for all pregnant women between the 36th and 37th week of pregnancy. GBS is a type of bacteria that can be passed from mother to baby during childbirth. Screening will help guide whether or not you will need treatment during labor and delivery to prevent complications such as: An infection in your uterus during labor. An infection in your uterus after delivery. A serious infection in your baby after delivery, such as pneumonia, meningitis, or sepsis. GBS screening is not often done before 36 weeks of pregnancy unless you go into labor prematurely. What happens if I have group   B streptococcus? If testing shows that you have GBS, your health care provider will recommend treatment with IV antibiotics during labor and delivery. This treatment significantly decreases the risk of complications for you and your baby. If you  have a planned C-section and you have GBS, you may not need to be treated with antibiotics because GBS is usually passed to babies after labor starts and your water breaks. If you are in labor or your water breaks before your C-section, it is possible for GBS to get into your uterus and be passed to your baby, so you might need treatment. Is there a chance I may not need to be tested? You may not need to be tested for GBS if: You have a urine test that shows GBS before 36 to 37 weeks. You had a baby with GBS infection after a previous delivery. In these cases, you will automatically be treated for GBS during labor and delivery. What is being tested? This test is done to check if you have group B streptococcus in your vagina or rectum. What kind of sample is taken? To collect samples for this test, your health care provider will swab your vagina and rectum with a cotton swab. The sample is then sent to the lab to see if GBS is present. What happens during the test?  You will remove your clothing from the waist down. You will lie down on an exam table in the same position as you would for a pelvic exam. Your health care provider will swab your vagina and rectum to collect samples for a culture test. You will be able to go home after the test and do all your usual activities. How are the results reported? The test results are reported as positive or negative. What do the results mean? A positive test means you are at risk for passing GBS to your baby during labor and delivery. Your health care provider will recommend that you are treated with an IV antibiotic during labor and delivery. A negative test means you are at very low risk of passing GBS to your baby. There is still a low risk of passing GBS to your baby because sometimes test results may report that you do not have a condition when you do (false-negative result) or there is a chance that you may become infected with GBS after the test is  done. You most likely will not need to be treated with an antibiotic during labor and delivery. Talk with your health care provider about what your results mean. Questions to ask your health care provider Ask your health care provider, or the department that is doing the test: When will my results be ready? How will I get my results? What are my treatment options? Summary Routine testing (screening) for group B streptococcus (GBS) is recommended for all pregnant women between the 36th and 37th week of pregnancy. GBS is a type of bacteria that can be passed from mother to baby during childbirth. If testing shows that you have GBS, your health care provider will recommend that you are treated with IV antibiotics during labor and delivery. This treatment almost always prevents infection in newborns. This information is not intended to replace advice given to you by your health care provider. Make sure you discuss any questions you have with your health care provider. Document Revised: 06/25/2020 Document Reviewed: 09/21/2018 Elsevier Patient Education  2022 Elsevier Inc.  

## 2021-07-23 NOTE — Progress Notes (Signed)
Subjective:  Brandy Ruiz is a 30 y.o. G2P0010 at [redacted]w[redacted]d being seen today for ongoing prenatal care.  She is currently monitored for the following issues for this high-risk pregnancy and has Encounter for supervision of normal pregnancy in first trimester; Bipolar 1 disorder (HCC); Anxiety during pregnancy; Tobacco smoking affecting pregnancy; [redacted] weeks gestation of pregnancy; and Rh negative state in antepartum period on their problem list.  Patient reports no complaints.  Contractions: Not present. Vag. Bleeding: None.  Movement: Present. Denies leaking of fluid.   The following portions of the patient's history were reviewed and updated as appropriate: allergies, current medications, past family history, past medical history, past social history, past surgical history and problem list. Problem list updated.  Objective:   Vitals:   07/23/21 1124  BP: 133/83  Pulse: 85  Weight: 245 lb (111.1 kg)    Fetal Status: Fetal Heart Rate (bpm): 135   Movement: Present     General:  Alert, oriented and cooperative. Patient is in no acute distress.  Skin: Skin is warm and dry. No rash noted.   Cardiovascular: Normal heart rate noted  Respiratory: Normal respiratory effort, no problems with respiration noted  Abdomen: Soft, gravid, appropriate for gestational age. Pain/Pressure: Absent     Pelvic: Vag. Bleeding: None     Cervical exam deferred        Extremities: Normal range of motion.  Edema: None  Mental Status: Normal mood and affect. Normal behavior. Normal judgment and thought content.   Urinalysis:      Assessment and Plan:  Pregnancy: G2P0010 at [redacted]w[redacted]d  1. Encounter for supervision of other normal pregnancy in first trimester -GBS next visit -HepC done today, not previously performed  2. Bipolar 1 disorder (HCC) -referral to [facility] -pt has not had anyone managing BPD or anxiety during pregnancy -denies symptoms of mania and reports stable mood, but is open to seeing [facility] d/t life  stress  3. Anxiety during pregnancy  PHQ9 SCORE ONLY 06/13/2021 02/17/2021  PHQ-9 Total Score 7 7   GAD 7 : Generalized Anxiety Score 06/13/2021 02/17/2021  Nervous, Anxious, on Edge 1 0  Control/stop worrying 1 0  Worry too much - different things 1 0  Trouble relaxing 1 3  Restless 1 1  Easily annoyed or irritable 2 3  Afraid - awful might happen 0 0  Total GAD 7 Score 7 7   4. Tobacco smoking affecting pregnancy in third trimester -pt reports no longer smokes, stopped smoking when she found out she waas pregnant  5. [redacted] weeks gestation of pregnancy  Preterm labor symptoms and general obstetric precautions including but not limited to vaginal bleeding, contractions, leaking of fluid and fetal movement were reviewed in detail with the patient. I discussed the assessment and treatment plan with the patient. The patient was provided an opportunity to ask questions and all were answered. The patient agreed with the plan and demonstrated an understanding of the instructions. The patient was advised to call back or seek an in-person office evaluation/go to MAU at [facility] for any urgent or concerning symptoms. Please refer to After Visit Summary for other counseling recommendations.  Return in about 2 weeks (around 08/06/2021) for in-person LOB/APP OK/GBS/cultures.   Marge Vandermeulen, Odie Sera, NP

## 2021-07-24 LAB — HEPATITIS C ANTIBODY
HCV Ab: NEGATIVE
Hep C Virus Ab: <0.1 s/co ratio (ref 0.0–0.9)

## 2021-08-06 ENCOUNTER — Other Ambulatory Visit: Payer: Self-pay

## 2021-08-06 ENCOUNTER — Ambulatory Visit (INDEPENDENT_AMBULATORY_CARE_PROVIDER_SITE_OTHER): Payer: PRIVATE HEALTH INSURANCE | Admitting: Women's Health

## 2021-08-06 ENCOUNTER — Other Ambulatory Visit (HOSPITAL_COMMUNITY)
Admission: RE | Admit: 2021-08-06 | Discharge: 2021-08-06 | Disposition: A | Discharge status: Home / Self Care | Payer: PRIVATE HEALTH INSURANCE | Source: Ambulatory Visit | Attending: Women's Health | Admitting: Women's Health

## 2021-08-06 VITALS — BP 129/89 | HR 81 | Wt 245.0 lb

## 2021-08-06 DIAGNOSIS — Z3481 Encounter for supervision of other normal pregnancy, first trimester: Secondary | ICD-10-CM | POA: Diagnosis present

## 2021-08-06 DIAGNOSIS — Z3A36 36 weeks gestation of pregnancy: Secondary | ICD-10-CM

## 2021-08-06 NOTE — Progress Notes (Signed)
Subjective:  Brandy Ruiz is a 30 y.o. G2P0010 at [redacted]w[redacted]d being seen today for ongoing prenatal care.  She is currently monitored for the following issues for this low-risk pregnancy and has Encounter for supervision of normal pregnancy in first trimester; Bipolar 1 disorder (HCC); Anxiety during pregnancy; Tobacco smoking affecting pregnancy; and Rh negative state in antepartum period on their problem list.  Patient reports no complaints.  Contractions: Regular. Vag. Bleeding: None.  Movement: Present. Denies leaking of fluid.   The following portions of the patient's history were reviewed and updated as appropriate: allergies, current medications, past family history, past medical history, past social history, past surgical history and problem list. Problem list updated.  Objective:   Vitals:   08/06/21 1338  BP: 129/89  Pulse: 81  Weight: 245 lb (111.1 kg)    Fetal Status: Fetal Heart Rate (bpm): 143 Fundal Height: 36 cm Movement: Present     General:  Alert, oriented and cooperative. Patient is in no acute distress.  Skin: Skin is warm and dry. No rash noted.   Cardiovascular: Normal heart rate noted  Respiratory: Normal respiratory effort, no problems with respiration noted  Abdomen: Soft, gravid, appropriate for gestational age. Pain/Pressure: Absent     Pelvic: Vag. Bleeding: None     Cervical exam deferred Pt declines exam.        Extremities: Normal range of motion.  Edema: Trace  Mental Status: Normal mood and affect. Normal behavior. Normal judgment and thought content.   Urinalysis:      Assessment and Plan:  Pregnancy: G2P0010 at [redacted]w[redacted]d  1. Encounter for supervision of other normal pregnancy in first trimester - peds list given - Culture, beta strep (group b only) - Cervicovaginal ancillary only( [facility])  PHQ9 SCORE ONLY 08/06/2021 06/13/2021 02/17/2021  PHQ-9 Total Score 0 7 7   GAD 7 : Generalized Anxiety Score 06/13/2021 02/17/2021  Nervous, Anxious, on  Edge 1 0  Control/stop worrying 1 0  Worry too much - different things 1 0  Trouble relaxing 1 3  Restless 1 1  Easily annoyed or irritable 2 3  Afraid - awful might happen 0 0  Total GAD 7 Score 7 7   2. [redacted] weeks gestation of pregnancy  Term labor symptoms and general obstetric precautions including but not limited to vaginal bleeding, contractions, leaking of fluid and fetal movement were reviewed in detail with the patient. I discussed the assessment and treatment plan with the patient. The patient was provided an opportunity to ask questions and all were answered. The patient agreed with the plan and demonstrated an understanding of the instructions. The patient was advised to call back or seek an in-person office evaluation/go to MAU at [facility] for any urgent or concerning symptoms. Please refer to After Visit Summary for other counseling recommendations.  Return in about 1 week (around 08/13/2021) for in-person LOB/APP OK.   Donnarae Rae, Odie Sera, NP

## 2021-08-06 NOTE — Patient Instructions (Signed)
Maternity Assessment Unit (MAU)  The Maternity Assessment Unit (MAU) is located at the [facility] and [place] [facility]. The address is: [address] [place], [place], [place] [address]. Please see map below for additional directions.    The Maternity Assessment Unit is designed to help you during your pregnancy, and for up to 6 weeks after delivery, with any pregnancy- or postpartum-related emergencies, if you think you are in labor, or if your water has broken. For example, if you experience nausea and vomiting, vaginal bleeding, severe abdominal or pelvic pain, elevated blood pressure or other problems related to your pregnancy or postpartum time, please come to the Maternity Assessment Unit for assistance.       Third Trimester of Pregnancy The third trimester of pregnancy is from week 28 through week 35. This is months 7 through 9. The third trimester is a time when the unborn baby (fetus) is growing rapidly. At the end of the ninth month, the fetus is about 20 inches long and weighs 6-10 pounds. Body changes during your third trimester During the third trimester, your body will continue to go through many changes. The changes vary and generally return to normal after your baby is born. Physical changes Your weight will continue to increase. You can expect to gain 25-35 pounds (11-16 kg) by the end of the pregnancy if you begin pregnancy at a normal weight. If you are underweight, you can expect to gain 28-40 lb (about 13-18 kg), and if you are overweight, you can expect to gain 15-25 lb (about 7-11 kg). You may begin to get stretch marks on your hips, abdomen, and breasts. Your breasts will continue to grow and may hurt. A yellow fluid (colostrum) may leak from your breasts. This is the first milk you are producing for your baby. You may have changes in your hair. These can include thickening of your hair, rapid growth, and changes in texture. Some people also  have hair loss during or after pregnancy, or hair that feels dry or thin. Your belly button may stick out. You may notice more swelling in your hands, face, or ankles. Health changes You may have heartburn. You may have constipation. You may develop hemorrhoids. You may develop swollen, bulging veins (varicose veins) in your legs. You may have increased body aches in the pelvis, back, or thighs. This is due to weight gain and increased hormones that are relaxing your joints. You may have increased tingling or numbness in your hands, arms, and legs. The skin on your abdomen may also feel numb. You may feel short of breath because of your expanding uterus. Other changes You may urinate more often because the fetus is moving lower into your pelvis and pressing on your bladder. You may have more problems sleeping. This may be caused by the size of your abdomen, an increased need to urinate, and an increase in your body's metabolism. You may notice the fetus "dropping," or moving lower in your abdomen (lightening). You may have increased vaginal discharge. You may notice that you have pain around your pelvic bone as your uterus distends. Follow these instructions at home: Medicines Follow your health care provider's instructions regarding medicine use. Specific medicines may be either safe or unsafe to take during pregnancy. Do not take any medicines unless approved by your health care provider. Take a prenatal vitamin that contains at least 600 micrograms (mcg) of folic acid. Eating and drinking Eat a healthy diet that includes fresh fruits  and vegetables, whole grains, good sources of protein such as meat, eggs, or tofu, and low-fat dairy products. Avoid raw meat and unpasteurized juice, milk, and cheese. These carry germs that can harm you and your baby. Eat 4 or 5 small meals rather than 3 large meals a day. You may need to take these actions to prevent or treat constipation: Drink enough  fluid to keep your urine pale yellow. Eat foods that are high in fiber, such as beans, whole grains, and fresh fruits and vegetables. Limit foods that are high in fat and processed sugars, such as fried or sweet foods. Activity Exercise only as directed by your health care provider. Most people can continue their usual exercise routine during pregnancy. Try to exercise for 30 minutes at least 5 days a week. Stop exercising if you experience contractions in the uterus. Stop exercising if you develop pain or cramping in the lower abdomen or lower back. Avoid heavy lifting. Do not exercise if it is very hot or humid or if you are at a high altitude. If you choose to, you may continue to have sex unless your health care provider tells you not to. Relieving pain and discomfort Take frequent breaks and rest with your legs raised (elevated) if you have leg cramps or low back pain. Take warm sitz baths to soothe any pain or discomfort caused by hemorrhoids. Use hemorrhoid cream if your health care provider approves. Wear a supportive bra to prevent discomfort from breast tenderness. If you develop varicose veins: Wear support hose as told by your health care provider. Elevate your feet for 15 minutes, 3-4 times a day. Limit salt in your diet. Safety Talk to your health care provider before traveling far distances. Do not use hot tubs, steam rooms, or saunas. Wear your seat belt at all times when driving or riding in a car. Talk with your health care provider if someone is verbally or physically abusive to you. Preparing for birth To prepare for the arrival of your baby: Take prenatal classes to understand, practice, and ask questions about labor and delivery. Visit the hospital and tour the maternity area. Purchase a rear-facing car seat and make sure you know how to install it in your car. Prepare the baby's room or sleeping area. Make sure to remove all pillows and stuffed animals from the  baby's crib to prevent suffocation. General instructions Avoid cat litter boxes and soil used by cats. These carry germs that can cause birth defects in the baby. If you have a cat, ask someone to clean the litter box for you. Do not douche or use tampons. Do not use scented sanitary pads. Do not use any products that contain nicotine or tobacco, such as cigarettes, e-cigarettes, and chewing tobacco. If you need help quitting, ask your health care provider. Do not use any herbal remedies, illegal drugs, or medicines that were not prescribed to you. Chemicals in these products can harm your baby. Do not drink alcohol. You will have more frequent prenatal exams during the third trimester. During a routine prenatal visit, your health care provider will do a physical exam, perform tests, and discuss your overall health. Keep all follow-up visits. This is important. Where to find more information American Pregnancy Association: americanpregnancy.[place] and Gynecologists: PoolDevices.com.pt Office on [employer] Health: KeywordPortfolios.com.br Contact a health care provider if you have: A fever. Mild pelvic cramps, pelvic pressure, or nagging pain in your abdominal area or lower back. Vomiting or diarrhea. Bad-smelling  vaginal discharge or foul-smelling urine. Pain when you urinate. A headache that does not go away when you take medicine. Visual changes or see spots in front of your eyes. Get help right away if: Your water breaks. You have regular contractions less than 5 minutes apart. You have spotting or bleeding from your vagina. You have severe abdominal pain. You have difficulty breathing. You have chest pain. You have fainting spells. You have not felt your baby move for the time period told by your health care provider. You have new or increased pain, swelling, or redness in an arm or leg. Summary The third trimester of pregnancy is  from week 28 through week 40 (months 7 through 9). You may have more problems sleeping. This can be caused by the size of your abdomen, an increased need to urinate, and an increase in your body's metabolism. You will have more frequent prenatal exams during the third trimester. Keep all follow-up visits. This is important. This information is not intended to replace advice given to you by your health care provider. Make sure you discuss any questions you have with your health care provider. Document Revised: 01/31/2020 Document Reviewed: 12/07/2019 Elsevier Patient Education  2022 Elsevier Inc.       Group B Streptococcus Test During Pregnancy Why am I having this test? Routine testing, also called screening, for group B streptococcus (GBS) is recommended for all pregnant women between the 36th and 37th week of pregnancy. GBS is a type of bacteria that can be passed from mother to baby during childbirth. Screening will help guide whether or not you will need treatment during labor and delivery to prevent complications such as: An infection in your uterus during labor. An infection in your uterus after delivery. A serious infection in your baby after delivery, such as pneumonia, meningitis, or sepsis. GBS screening is not often done before 36 weeks of pregnancy unless you go into labor prematurely. What happens if I have group B streptococcus? If testing shows that you have GBS, your health care provider will recommend treatment with IV antibiotics during labor and delivery. This treatment significantly decreases the risk of complications for you and your baby. If you have a planned C-section and you have GBS, you may not need to be treated with antibiotics because GBS is usually passed to babies after labor starts and your water breaks. If you are in labor or your water breaks before your C-section, it is possible for GBS to get into your uterus and be passed to your baby, so you might need  treatment. Is there a chance I may not need to be tested? You may not need to be tested for GBS if: You have a urine test that shows GBS before 36 to 37 weeks. You had a baby with GBS infection after a previous delivery. In these cases, you will automatically be treated for GBS during labor and delivery. What is being tested? This test is done to check if you have group B streptococcus in your vagina or rectum. What kind of sample is taken? To collect samples for this test, your health care provider will swab your vagina and rectum with a cotton swab. The sample is then sent to the lab to see if GBS is present. What happens during the test?  You will remove your clothing from the waist down. You will lie down on an exam table in the same position as you would for a pelvic exam. Your health care provider will  swab your vagina and rectum to collect samples for a culture test. You will be able to go home after the test and do all your usual activities. How are the results reported? The test results are reported as positive or negative. What do the results mean? A positive test means you are at risk for passing GBS to your baby during labor and delivery. Your health care provider will recommend that you are treated with an IV antibiotic during labor and delivery. A negative test means you are at very low risk of passing GBS to your baby. There is still a low risk of passing GBS to your baby because sometimes test results may report that you do not have a condition when you do (false-negative result) or there is a chance that you may become infected with GBS after the test is done. You most likely will not need to be treated with an antibiotic during labor and delivery. Talk with your health care provider about what your results mean. Questions to ask your health care provider Ask your health care provider, or the department that is doing the test: When will my results be ready? How will I get my  results? What are my treatment options? Summary Routine testing (screening) for group B streptococcus (GBS) is recommended for all pregnant women between the 36th and 37th week of pregnancy. GBS is a type of bacteria that can be passed from mother to baby during childbirth. If testing shows that you have GBS, your health care provider will recommend that you are treated with IV antibiotics during labor and delivery. This treatment almost always prevents infection in newborns. This information is not intended to replace advice given to you by your health care provider. Make sure you discuss any questions you have with your health care provider. Document Revised: 06/25/2020 Document Reviewed: 09/21/2018 Elsevier Patient Education  2022 Elsevier Inc.       Signs and Symptoms of Labor Labor is the body's natural process of moving the baby and the placenta out of the uterus. The process of labor usually starts when the baby is full-term, between 65 and 41 weeks of pregnancy. Signs and symptoms that you are close to going into labor As your body prepares for labor and the birth of your baby, you may notice the following symptoms in the weeks and days before true labor starts: Passing a small amount of thick, bloody mucus from your vagina. This is called normal bloody show or losing your mucus plug. This may happen more than a week before labor begins, or right before labor begins, as the opening of the cervix starts to widen (dilate). For some women, the entire mucus plug passes at once. For others, pieces of the mucus plug may gradually pass over several days. Your baby moving (dropping) lower in your pelvis to get into position for birth (lightening). When this happens, you may feel more pressure on your bladder and pelvic bone and less pressure on your ribs. This may make it easier to breathe. It may also cause you to need to urinate more often and have problems with bowel movements. Having  "practice contractions," also called Braxton Hicks contractions or false labor. These occur at irregular (unevenly spaced) intervals that are more than 10 minutes apart. False labor contractions are common after exercise or sexual activity. They will stop if you change position, rest, or drink fluids. These contractions are usually mild and do not get stronger over time. They may feel like:  A backache or back pain. Mild cramps, similar to menstrual cramps. Tightening or pressure in your abdomen. Other early symptoms include: Nausea or loss of appetite. Diarrhea. Having a sudden burst of energy, or feeling very tired. Mood changes. Having trouble sleeping. Signs and symptoms that labor has begun Signs that you are in labor may include: Having contractions that come at regular (evenly spaced) intervals and increase in intensity. This may feel like more intense tightening or pressure in your abdomen that moves to your back. Contractions may also feel like rhythmic pain in your upper thighs or back that comes and goes at regular intervals. If you are delivering for the first time, this change in intensity of contractions often occurs at a more gradual pace. If you have given birth before, you may notice a more rapid progression of contraction changes. Feeling pressure in the vaginal area. Your water breaking (rupture of membranes). This is when the sac of fluid that surrounds your baby breaks. Fluid leaking from your vagina may be clear or blood-tinged. Labor usually starts within 24 hours of your water breaking, but it may take longer to begin. Some people may feel a sudden gush of fluid; others may notice repeatedly damp underwear. Follow these instructions at home:  When labor starts, or if your water breaks, call your health care provider or nurse care line. Based on your situation, they will determine when you should go in for an exam. During early labor, you may be able to rest and manage  symptoms at home. Some strategies to try at home include: Breathing and relaxation techniques. Taking a warm bath or shower. Listening to music. Using a heating pad on the lower back for pain. If directed, apply heat to the area as often as told by your health care provider. Use the heat source that your health care provider recommends, such as a moist heat pack or a heating pad. Place a towel between your skin and the heat source. Leave the heat on for 20-30 minutes. Remove the heat if your skin turns bright red. This is especially important if you are unable to feel pain, heat, or cold. You have a greater risk of getting burned. Contact a health care provider if: Your labor has started. Your water breaks. You have nausea, vomiting, or diarrhea. Get help right away if: You have painful, regular contractions that are 5 minutes apart or less. Labor starts before you are [redacted] weeks along in your pregnancy. You have a fever. You have bright red blood coming from your vagina. You do not feel your baby moving. You have a severe headache with or without vision problems. You have chest pain or shortness of breath. These symptoms may represent a serious problem that is an emergency. Do not wait to see if the symptoms will go away. Get medical help right away. Call your local emergency services (911 in the [place] Do not drive yourself to the hospital. Summary Labor is your body's natural process of moving your baby and the placenta out of your uterus. The process of labor usually starts when your baby is full-term, between 22 and 40 weeks of pregnancy. When labor starts, or if your water breaks, call your health care provider or nurse care line. Based on your situation, they will determine when you should go in for an exam. This information is not intended to replace advice given to you by your health care provider. Make sure you discuss any questions you have with your health  care provider. Document  Revised: 01/07/2021 Document Reviewed: 01/07/2021 Elsevier Patient Education  2022 Elsevier Inc.       AREA PEDIATRIC/FAMILY PRACTICE PHYSICIANS  [facility] [address] [place], [place] [address] Phone - [phone]   Fax - [phone]  JACK AMOS [address] [place], [place]  [address] Phone - [phone]   Fax - [phone]  [facility] [address] [place], [place]  [address] Phone - [phone]   Fax - [phone]  [facility] [address] [place], [place]  [address] Phone - [phone]   Fax - [phone]  [facility] [address] [place], [place]  [address] Phone - [phone]   Fax - [phone]  [facility] [address] [place], [place]  [address] Phone - [phone]   Fax - [phone]  [facility] [place] [address] [place], [place]  [address] Phone - [phone]   Fax - [phone]  [facility] AT [facility] [address] [place], [address] [place], [place]  [address] Phone - [phone]   Fax - [phone]  [facility] AT [facility] [address] [place], [place]  [address] Phone - [phone]   Fax - [phone] [facility] AT [place] [address] [place], [place]  [address] Phone - [phone]   Fax - [phone]  [facility] [address] [place], [place]  [address] Phone - [phone]   Fax - [phone]  [facility] [address] [place], [place]  [address] Phone - [phone]   Fax - [phone]  [facility] [place] [address] [place], [place]  [address] Phone - [phone]   Fax - [phone]  [facility] [address] [place], [place]  [address] Phone - [phone]  [facility] [address]  [place], [place]  [address] Phone - [phone]   Fax - [phone]  [facility] [address] [place], [place]  [address] Phone - [phone]   Fax - [phone]  [facility] [address] [place], [place]  [address] Phone - [phone]   Fax - [phone]  [facility] [address] [place], [place]  [address] Phone - [phone]   Fax - [phone]   [facility] [address] [place], [place]  [address] Phone - [phone]   Fax - [phone]  [facility] [address] [place], [place]  [address] Phone - [phone]   Fax - [phone]  DAVID RUBIN [address] [place], [place]  [address] Phone - [phone]   Fax - [phone]  [facility] [address] [place], [place]  [address] Phone - [phone]   Fax - [phone]  [facility] [address] [place], [place]  [address] Phone - [phone]   Fax - [phone] Gerarda Fraction [address] [place], [place]  [address] Phone - [phone]   Fax - [phone]  [facility] [address] [place], [place]  [address] Phone - [phone]   Fax - [phone]  [facility] - [place] [address] [place], [place]  [address] Phone - [phone]   Fax - [phone]

## 2021-08-07 LAB — CERVICOVAGINAL ANCILLARY ONLY
Chlamydia: NEGATIVE
Comment: NEGATIVE
Comment: NORMAL
Neisseria Gonorrhea: NEGATIVE

## 2021-08-07 LAB — OB RESULTS CONSOLE GC/CHLAMYDIA: Gonorrhea: NEGATIVE

## 2021-08-10 LAB — CULTURE, BETA STREP (GROUP B ONLY): Strep Gp B Culture: NEGATIVE

## 2021-08-13 ENCOUNTER — Other Ambulatory Visit: Payer: Self-pay

## 2021-08-13 ENCOUNTER — Ambulatory Visit (INDEPENDENT_AMBULATORY_CARE_PROVIDER_SITE_OTHER): Payer: PRIVATE HEALTH INSURANCE | Admitting: Obstetrics

## 2021-08-13 ENCOUNTER — Encounter: Payer: Self-pay | Admitting: Obstetrics

## 2021-08-13 VITALS — BP 142/92 | HR 97 | Wt 246.0 lb

## 2021-08-13 DIAGNOSIS — F319 Bipolar disorder, unspecified: Secondary | ICD-10-CM

## 2021-08-13 DIAGNOSIS — Z3A37 37 weeks gestation of pregnancy: Secondary | ICD-10-CM

## 2021-08-13 DIAGNOSIS — Z23 Encounter for immunization: Secondary | ICD-10-CM | POA: Diagnosis not present

## 2021-08-13 DIAGNOSIS — O99343 Other mental disorders complicating pregnancy, third trimester: Secondary | ICD-10-CM

## 2021-08-13 DIAGNOSIS — Z3403 Encounter for supervision of normal first pregnancy, third trimester: Secondary | ICD-10-CM

## 2021-08-13 DIAGNOSIS — O99333 Smoking (tobacco) complicating pregnancy, third trimester: Secondary | ICD-10-CM

## 2021-08-13 NOTE — Progress Notes (Signed)
States BP is up because she has been waiting in the lobby a long time. Denies HA, blurry vision, dizziness, RUQ abd.

## 2021-08-13 NOTE — Progress Notes (Signed)
Subjective:  Brandy Ruiz is a 30 y.o. G2P0010 at [redacted]w[redacted]d being seen today for ongoing prenatal care.  She is currently monitored for the following issues for this low-risk pregnancy and has Encounter for supervision of normal pregnancy in first trimester; Bipolar 1 disorder (HCC); Anxiety during pregnancy; Tobacco smoking affecting pregnancy; and Rh negative state in antepartum period on their problem list.  Patient reports heartburn.  Contractions: Not present. Vag. Bleeding: None.  Movement: Present. Denies leaking of fluid.   The following portions of the patient's history were reviewed and updated as appropriate: allergies, current medications, past family history, past medical history, past social history, past surgical history and problem list. Problem list updated.  Objective:   Vitals:   08/13/21 1519  BP: (!) 142/92  Pulse: 97  Weight: 246 lb (111.6 kg)    Fetal Status: Fetal Heart Rate (bpm): 145   Movement: Present     General:  Alert, oriented and cooperative. Patient is in no acute distress.  Skin: Skin is warm and dry. No rash noted.   Cardiovascular: Normal heart rate noted  Respiratory: Normal respiratory effort, no problems with respiration noted  Abdomen: Soft, gravid, appropriate for gestational age. Pain/Pressure: Absent     Pelvic:  Cervical exam deferred        Extremities: Normal range of motion.  Edema: Trace  Mental Status: Normal mood and affect. Normal behavior. Normal judgment and thought content.   Urinalysis:      Assessment and Plan:  Pregnancy: G2P0010 at [redacted]w[redacted]d  1. Encounter for supervision of normal first pregnancy in third trimester  2. Bipolar 1 disorder (HCC) - clinically stable  3. Tobacco smoking affecting pregnancy in third trimester - cessation recommended with the aid of medication and behavioral modification   Term labor symptoms and general obstetric precautions including but not limited to vaginal bleeding, contractions,  leaking of fluid and fetal movement were reviewed in detail with the patient. Please refer to After Visit Summary for other counseling recommendations.   Return in about 1 week (around 08/20/2021) for ROB.   Brock Bad, MD  08/13/21

## 2021-08-20 ENCOUNTER — Other Ambulatory Visit: Payer: Self-pay | Admitting: Advanced Practice Midwife

## 2021-08-20 ENCOUNTER — Other Ambulatory Visit: Payer: Self-pay

## 2021-08-20 ENCOUNTER — Ambulatory Visit (INDEPENDENT_AMBULATORY_CARE_PROVIDER_SITE_OTHER): Payer: PRIVATE HEALTH INSURANCE | Admitting: Women's Health

## 2021-08-20 VITALS — BP 135/85 | HR 86 | Wt 251.6 lb

## 2021-08-20 DIAGNOSIS — O99340 Other mental disorders complicating pregnancy, unspecified trimester: Secondary | ICD-10-CM

## 2021-08-20 DIAGNOSIS — F419 Anxiety disorder, unspecified: Secondary | ICD-10-CM

## 2021-08-20 DIAGNOSIS — O10919 Unspecified pre-existing hypertension complicating pregnancy, unspecified trimester: Secondary | ICD-10-CM | POA: Insufficient documentation

## 2021-08-20 DIAGNOSIS — F319 Bipolar disorder, unspecified: Secondary | ICD-10-CM

## 2021-08-20 DIAGNOSIS — Z3481 Encounter for supervision of other normal pregnancy, first trimester: Secondary | ICD-10-CM

## 2021-08-20 DIAGNOSIS — Z3A38 38 weeks gestation of pregnancy: Secondary | ICD-10-CM

## 2021-08-20 NOTE — Progress Notes (Signed)
Subjective:  Brandy Ruiz is a 30 y.o. G2P0010 at [redacted]w[redacted]d being seen today for ongoing prenatal care.  She is currently monitored for the following issues for this low-risk pregnancy and has Encounter for supervision of normal pregnancy in first trimester; Bipolar 1 disorder (HCC); Anxiety during pregnancy; Tobacco smoking affecting pregnancy; Rh negative state in antepartum period; Chronic hypertension affecting pregnancy; and SVD (spontaneous vaginal delivery) on their problem list.  Patient reports no complaints.  Contractions: Not present. Vag. Bleeding: None.  Movement: Present. Denies leaking of fluid.   The following portions of the patient's history were reviewed and updated as appropriate: allergies, current medications, past family history, past medical history, past social history, past surgical history and problem list. Problem list updated.  Objective:   Vitals:   08/20/21 1447  BP: 135/85  Pulse: 86  Weight: 251 lb 9.6 oz (114.1 kg)    Fetal Status: Fetal Heart Rate (bpm): 146   Movement: Present     General:  Alert, oriented and cooperative. Patient is in no acute distress.  Skin: Skin is warm and dry. No rash noted.   Cardiovascular: Normal heart rate noted  Respiratory: Normal respiratory effort, no problems with respiration noted  Abdomen: Soft, gravid, appropriate for gestational age. Pain/Pressure: Absent     Pelvic: Vag. Bleeding: None     Cervical exam deferred        Extremities: Normal range of motion.     Mental Status: Normal mood and affect. Normal behavior. Normal judgment and thought content.   Urinalysis:      Assessment and Plan:  Pregnancy: G2P0010 at [redacted]w[redacted]d  1. Encounter for supervision of other normal pregnancy in first trimester -BP normal today, but patient has had elevated BP x4 in prenatal care -consulted with Dr. Ashok Pall on labor regarding multiple elevated BPs in office and diagnosis of gHTN, but after looking in chart patient likely has  cHTN -NST performed in office, reactive -IOL scheduled for 08/22/2021 in consultation with Dr. Ashok Pall  2. Anxiety during pregnancy  3. Bipolar 1 disorder (HCC)  4. [redacted] weeks gestation of pregnancy  Term labor symptoms and general obstetric precautions including but not limited to vaginal bleeding, contractions, leaking of fluid and fetal movement were reviewed in detail with the patient. I discussed the assessment and treatment plan with the patient. The patient was provided an opportunity to ask questions and all were answered. The patient agreed with the plan and demonstrated an understanding of the instructions. The patient was advised to call back or seek an in-person office evaluation/go to [facility] at [facility] for any urgent or concerning symptoms. Please refer to After Visit Summary for other counseling recommendations.  Return for IOL Friday 08/22/2021.   Audree Schrecengost, Odie Sera, NP

## 2021-08-22 ENCOUNTER — Encounter (HOSPITAL_COMMUNITY): Payer: PRIVATE HEALTH INSURANCE

## 2021-08-22 ENCOUNTER — Inpatient Hospital Stay (HOSPITAL_COMMUNITY)
Admission: AD | Admit: 2021-08-22 | Discharge: 2021-08-25 | DRG: 807 | Disposition: A | Discharge status: Home / Self Care | Payer: PRIVATE HEALTH INSURANCE | Attending: Obstetrics and Gynecology | Admitting: Obstetrics and Gynecology

## 2021-08-22 ENCOUNTER — Inpatient Hospital Stay (HOSPITAL_COMMUNITY): Payer: PRIVATE HEALTH INSURANCE

## 2021-08-22 ENCOUNTER — Other Ambulatory Visit: Payer: Self-pay

## 2021-08-22 ENCOUNTER — Encounter (HOSPITAL_COMMUNITY): Payer: Self-pay | Admitting: Family Medicine

## 2021-08-22 DIAGNOSIS — F319 Bipolar disorder, unspecified: Secondary | ICD-10-CM | POA: Diagnosis present

## 2021-08-22 DIAGNOSIS — Z6791 Unspecified blood type, Rh negative: Secondary | ICD-10-CM

## 2021-08-22 DIAGNOSIS — O99333 Smoking (tobacco) complicating pregnancy, third trimester: Secondary | ICD-10-CM

## 2021-08-22 DIAGNOSIS — Z20822 Contact with and (suspected) exposure to covid-19: Secondary | ICD-10-CM | POA: Diagnosis present

## 2021-08-22 DIAGNOSIS — Z23 Encounter for immunization: Secondary | ICD-10-CM | POA: Diagnosis not present

## 2021-08-22 DIAGNOSIS — Z141 Cystic fibrosis carrier: Secondary | ICD-10-CM

## 2021-08-22 DIAGNOSIS — O99214 Obesity complicating childbirth: Secondary | ICD-10-CM | POA: Diagnosis present

## 2021-08-22 DIAGNOSIS — O1002 Pre-existing essential hypertension complicating childbirth: Secondary | ICD-10-CM | POA: Diagnosis present

## 2021-08-22 DIAGNOSIS — D563 Thalassemia minor: Secondary | ICD-10-CM | POA: Diagnosis present

## 2021-08-22 DIAGNOSIS — F419 Anxiety disorder, unspecified: Secondary | ICD-10-CM | POA: Diagnosis present

## 2021-08-22 DIAGNOSIS — O99344 Other mental disorders complicating childbirth: Secondary | ICD-10-CM | POA: Diagnosis present

## 2021-08-22 DIAGNOSIS — Z87891 Personal history of nicotine dependence: Secondary | ICD-10-CM | POA: Diagnosis not present

## 2021-08-22 DIAGNOSIS — O99330 Smoking (tobacco) complicating pregnancy, unspecified trimester: Secondary | ICD-10-CM | POA: Diagnosis present

## 2021-08-22 DIAGNOSIS — O26899 Other specified pregnancy related conditions, unspecified trimester: Secondary | ICD-10-CM

## 2021-08-22 DIAGNOSIS — Z3A39 39 weeks gestation of pregnancy: Secondary | ICD-10-CM

## 2021-08-22 DIAGNOSIS — O43123 Velamentous insertion of umbilical cord, third trimester: Secondary | ICD-10-CM | POA: Diagnosis present

## 2021-08-22 DIAGNOSIS — Z3491 Encounter for supervision of normal pregnancy, unspecified, first trimester: Secondary | ICD-10-CM

## 2021-08-22 DIAGNOSIS — O26893 Other specified pregnancy related conditions, third trimester: Secondary | ICD-10-CM | POA: Diagnosis present

## 2021-08-22 DIAGNOSIS — O10919 Unspecified pre-existing hypertension complicating pregnancy, unspecified trimester: Secondary | ICD-10-CM

## 2021-08-22 DIAGNOSIS — Z3481 Encounter for supervision of other normal pregnancy, first trimester: Secondary | ICD-10-CM

## 2021-08-22 DIAGNOSIS — O1092 Unspecified pre-existing hypertension complicating childbirth: Secondary | ICD-10-CM | POA: Diagnosis not present

## 2021-08-22 HISTORY — DX: Post-traumatic stress disorder, unspecified: F43.10

## 2021-08-22 HISTORY — DX: Bipolar disorder, current episode manic without psychotic features, mild: F31.11

## 2021-08-22 HISTORY — DX: Depression, unspecified: F32.A

## 2021-08-22 HISTORY — DX: Anxiety disorder, unspecified: F41.9

## 2021-08-22 LAB — PROTEIN / CREATININE RATIO, URINE
Creatinine, Urine: 198.42 mg/dL
Protein Creatinine Ratio: 0.08 mg/mg{Cre} (ref 0.00–0.15)
Total Protein, Urine: 16 mg/dL

## 2021-08-22 LAB — CBC
HCT: 35.9 % — ABNORMAL LOW (ref 36.0–46.0)
Hemoglobin: 11.3 g/dL — ABNORMAL LOW (ref 12.0–15.0)
MCH: 26 pg (ref 26.0–34.0)
MCHC: 31.5 g/dL (ref 30.0–36.0)
MCV: 82.7 fL (ref 80.0–100.0)
Platelets: 186 10*3/uL (ref 150–400)
RBC: 4.34 MIL/uL (ref 3.87–5.11)
RDW: 14.1 % (ref 11.5–15.5)
WBC: 8.6 10*3/uL (ref 4.0–10.5)
nRBC: 0 % (ref 0.0–0.2)

## 2021-08-22 LAB — RESP PANEL BY RT-PCR (FLU A&B, COVID) ARPGX2
Influenza A by PCR: NEGATIVE
Influenza B by PCR: NEGATIVE
SARS Coronavirus 2 by RT PCR: NEGATIVE

## 2021-08-22 LAB — COMPREHENSIVE METABOLIC PANEL
ALT: 10 U/L (ref 0–44)
AST: 20 U/L (ref 15–41)
Albumin: 2.6 g/dL — ABNORMAL LOW (ref 3.5–5.0)
Alkaline Phosphatase: 167 U/L — ABNORMAL HIGH (ref 38–126)
Anion gap: 9 (ref 5–15)
BUN: 10 mg/dL (ref 6–20)
CO2: 18 mmol/L — ABNORMAL LOW (ref 22–32)
Calcium: 8.5 mg/dL — ABNORMAL LOW (ref 8.9–10.3)
Chloride: 105 mmol/L (ref 98–111)
Creatinine, Ser: 0.91 mg/dL (ref 0.44–1.00)
GFR, Estimated: >60 mL/min (ref >60)
Glucose, Bld: 88 mg/dL (ref 70–99)
Potassium: 4.1 mmol/L (ref 3.5–5.1)
Sodium: 132 mmol/L — ABNORMAL LOW (ref 135–145)
Total Bilirubin: 0.5 mg/dL (ref 0.3–1.2)
Total Protein: 5.6 g/dL — ABNORMAL LOW (ref 6.5–8.1)

## 2021-08-22 MED ORDER — LIDOCAINE HCL (PF) 1 % IJ SOLN: 30.0000 mL | INTRAMUSCULAR | Status: DC | PRN

## 2021-08-22 MED ORDER — OXYCODONE-ACETAMINOPHEN 5-325 MG PO TABS: 1.0000 | ORAL_TABLET | ORAL | Status: DC | PRN

## 2021-08-22 MED ORDER — TERBUTALINE SULFATE 1 MG/ML IJ SOLN: 0.2500 mg | Freq: Once | INTRAMUSCULAR | Status: DC | PRN

## 2021-08-22 MED ORDER — LACTATED RINGERS IV SOLN
500.0000 mL | INTRAVENOUS | Status: DC | PRN
  Administered 2021-08-23: 500 mL via INTRAVENOUS

## 2021-08-22 MED ORDER — MISOPROSTOL 25 MCG QUARTER TABLET
25.0000 ug | ORAL_TABLET | ORAL | Status: DC | PRN
  Administered 2021-08-22: 25 ug via VAGINAL
  Filled 2021-08-22: qty 1

## 2021-08-22 MED ORDER — OXYTOCIN-SODIUM CHLORIDE 30-0.9 UT/500ML-% IV SOLN: 2.5000 [IU]/h | INTRAVENOUS | Status: DC

## 2021-08-22 MED ORDER — ONDANSETRON HCL 4 MG/2ML IJ SOLN
4.0000 mg | Freq: Four times a day (QID) | INTRAMUSCULAR | Status: DC | PRN
  Administered 2021-08-23: 4 mg via INTRAVENOUS
  Filled 2021-08-22: qty 2

## 2021-08-22 MED ORDER — LACTATED RINGERS IV SOLN: INTRAVENOUS | Status: DC

## 2021-08-22 MED ORDER — SOD CITRATE-CITRIC ACID 500-334 MG/5ML PO SOLN: 30.0000 mL | ORAL | Status: DC | PRN

## 2021-08-22 MED ORDER — OXYTOCIN BOLUS FROM INFUSION
333.0000 mL | Freq: Once | INTRAVENOUS | Status: AC
  Administered 2021-08-23: 333 mL via INTRAVENOUS

## 2021-08-22 MED ORDER — OXYCODONE-ACETAMINOPHEN 5-325 MG PO TABS: 2.0000 | ORAL_TABLET | ORAL | Status: DC | PRN

## 2021-08-22 MED ORDER — ACETAMINOPHEN 325 MG PO TABS: 650.0000 mg | ORAL_TABLET | ORAL | Status: DC | PRN

## 2021-08-22 MED ORDER — FENTANYL CITRATE (PF) 100 MCG/2ML IJ SOLN
100.0000 ug | INTRAMUSCULAR | Status: DC | PRN
  Administered 2021-08-22 – 2021-08-23 (×3): 100 ug via INTRAVENOUS
  Filled 2021-08-22 (×3): qty 2

## 2021-08-22 NOTE — H&P (Signed)
OBSTETRIC ADMISSION HISTORY AND PHYSICAL  Brandy Ruiz is a 30 y.o. female G2P0010 with IUP at 12w0dby LMP presenting for IOL d/t cHTN.  She reports +FMs, No LOF, no VB, no blurry vision, headaches or peripheral edema, and RUQ pain.  She plans on breast feeding. She declines birth control. She received her prenatal care at  [facility] [facility] --->  Estimated Date of Delivery: 08/29/21  Sono:    '@[redacted]w[redacted]d' , CWD, normal anatomy, breech presentation, posterior placenta, 494g, 68% EFW  '@[redacted]w[redacted]d' , CWD, normal anatomy, cephalic presentation, posterior placenta, 2679g, 71% EFW  Prenatal History/Complications:  - cHTN - Rh negative - Bipolar disorder - Marginal cord insertion - Obesity - Tobacco use in early pregnancy  - Alpha Thalassemia silent carrier - Cystic Fibrosis carrier  Past Medical History: Past Medical History:  Diagnosis Date   Asthma    Environmental and seasonal allergies    Heart murmur     Past Surgical History: Past Surgical History:  Procedure Laterality Date   ADENOIDECTOMY     FOOT SURGERY Right 2015   TONSILECTOMY/ADENOIDECTOMY WITH MYRINGOTOMY     Obstetrical History: OB History     Gravida  2   Para      Term      Preterm      AB  1   Living         SAB  1   IAB      Ectopic      Multiple      Live Births             Social History Social History   Socioeconomic History   Marital status: Single    Spouse name: Not on file   Number of children: Not on file   Years of education: Not on file   Highest education level: Not on file  Occupational History   Not on file  Tobacco Use   Smoking status: Every Day   Smokeless tobacco: Never  Vaping Use   Vaping Use: Never used  Substance and Sexual Activity   Alcohol use: Not Currently    Comment: not since confirmed pregnancy   Drug use: Not Currently    Comment: CBD gummy, not since confirmed pregnancy   Sexual activity: Not Currently    Partners: Male     Birth control/protection: None  Other Topics Concern   Not on file  Social History Narrative   Not on file   Social Determinants of Health   Financial Resource Strain: Not on file  Food Insecurity: Not on file  Transportation Needs: Not on file  Physical Activity: Not on file  Stress: Not on file  Social Connections: Not on file   Family History: Family History  Problem Relation Age of Onset   Diabetes Maternal Grandmother    Allergies: Allergies  Allergen Reactions   Amoxicillin Hives   Other     I'm not supposed to take Beta-blockers.   Penicillins     "I turn pink."    Medications Prior to Admission  Medication Sig Dispense Refill Last Dose   albuterol (VENTOLIN HFA) 108 (90 Base) MCG/ACT inhaler Inhale 2 puffs into the lungs every 4 (four) hours as needed.      Blood Pressure Monitoring (BLOOD PRESSURE KIT) DEVI Check blood pressure 1-2 per week (Patient not taking: No sig reported) 1 each 0    busPIRone (BUSPAR) 10 MG tablet Take 1 tablet (10 mg total) by mouth 3 (  three) times daily as needed. 30 tablet 0    Doxylamine-Pyridoxine (DICLEGIS) 10-10 MG TBEC Take 2 tabs at bedtime. If needed, add another tab in the morning. If needed, add another tab in the afternoon, up to 4 tabs/day. 100 tablet 5    EPINEPHrine 0.3 mg/0.3 mL IJ SOAJ injection INJECT FOR SEVERE ALLERGIC REACTION AS DIRECTED (Patient not taking: No sig reported)      lamoTRIgine (LAMICTAL) 100 MG tablet Take 2 tablets by mouth daily. (Patient not taking: No sig reported)      ondansetron (ZOFRAN ODT) 4 MG disintegrating tablet Take 1 tablet (4 mg total) by mouth every 6 (six) hours as needed for nausea. (Patient not taking: No sig reported) 20 tablet 2    pantoprazole (PROTONIX) 40 MG tablet Take 1 tablet (40 mg total) by mouth daily. (Patient not taking: Reported on 07/23/2021) 30 tablet 6    Prenatal Vit-Fe Fumarate-FA (PREPLUS) 27-1 MG TABS Take 1 tablet by mouth daily. 90 tablet 4    Review of Systems    All systems reviewed and negative except as stated in HPI  Height '5\' 5"'  (1.651 m), weight 113.9 kg, last menstrual period 11/22/2020. General appearance: alert, cooperative, and no distress Lungs: Normal WOB  Heart: regular rate and rhythm Abdomen: soft, non-tender; bowel sounds normal Pelvic: NEFG Extremities: Homans sign is negative, no sign of DVT Presentation: cephalic Fetal monitoringBaseline: 140 bpm, Variability: Good {> 6 bpm), Accelerations: Reactive, and Decelerations: Absent Uterine activityNone    Prenatal labs: ABO, Rh: O/Negative/-- (06/14 1452) Antibody: Negative (06/14 1452) Rubella: 6.24 (06/14 1452) RPR: Non Reactive (10/07 0947)  HBsAg: Negative (06/14 1452)  HIV: Non Reactive (10/07 0947)  GBS: Negative/-- (11/30 1350)  2 hr Glucola: 78/147/110 Genetic screening: LR NIPS, AFP neg (increased carrier risk for SMA/alpha thal [place])  Anatomy US: normal  Prenatal Transfer Tool  Maternal Diabetes: No Genetic Screening: Normal Maternal Ultrasounds/Referrals: Normal Fetal Ultrasounds or other Referrals:  None Maternal Substance Abuse:  No Significant Maternal Medications:  None Significant Maternal Lab Results: Group B Strep negative and Rh negative  No results found for this or any previous visit (from the past 24 hour(s)).  Patient Active Problem List   Diagnosis Date Noted   Chronic hypertension affecting pregnancy 08/20/2021   Rh negative state in antepartum period 07/23/2021   Bipolar 1 disorder ([place]) 02/18/2021   Anxiety during pregnancy 02/18/2021   Tobacco smoking affecting pregnancy 02/18/2021   Encounter for supervision of normal pregnancy in first trimester 02/17/2021    Assessment/Plan:  Brandy Ruiz is a 30 y.o. G2P0010 at 73w0dhere for IOL d/t cHTN.  #Labor: Placed vaginal cytotec and after verbal consent, placed FB.  #Pain: PRN  #FWB: Cat 1  #ID: GBS negative #MOF: Breast #MOC: Declines #Circ: N/A  #cHTN: SBP 130-140's since  arrival. No medications at home. Asymptomatic. Pre-e labs to be collected. Cont to monitor.   #Bipolar   anxiety: Well controlled currently, managing with buspar.   #Rh neg: RhoGAM eval postpartum.    SPatriciaann Clan DO  08/22/2021, 8:14 PM

## 2021-08-23 ENCOUNTER — Inpatient Hospital Stay (HOSPITAL_COMMUNITY): Payer: PRIVATE HEALTH INSURANCE | Admitting: Anesthesiology

## 2021-08-23 ENCOUNTER — Encounter (HOSPITAL_COMMUNITY): Payer: Self-pay | Admitting: Family Medicine

## 2021-08-23 DIAGNOSIS — Z3A39 39 weeks gestation of pregnancy: Secondary | ICD-10-CM

## 2021-08-23 DIAGNOSIS — O1092 Unspecified pre-existing hypertension complicating childbirth: Secondary | ICD-10-CM

## 2021-08-23 LAB — CBC
HCT: 36.5 % (ref 36.0–46.0)
Hemoglobin: 11.8 g/dL — ABNORMAL LOW (ref 12.0–15.0)
MCH: 26.5 pg (ref 26.0–34.0)
MCHC: 32.3 g/dL (ref 30.0–36.0)
MCV: 81.8 fL (ref 80.0–100.0)
Platelets: 173 10*3/uL (ref 150–400)
RBC: 4.46 MIL/uL (ref 3.87–5.11)
RDW: 13.9 % (ref 11.5–15.5)
WBC: 15.8 10*3/uL — ABNORMAL HIGH (ref 4.0–10.5)
nRBC: 0 % (ref 0.0–0.2)

## 2021-08-23 LAB — TYPE AND SCREEN
ABO/RH(D): O NEG
Antibody Screen: POSITIVE

## 2021-08-23 LAB — RPR: RPR Ser Ql: NONREACTIVE

## 2021-08-23 MED ORDER — EPHEDRINE 5 MG/ML INJ: 10.0000 mg | INTRAVENOUS | Status: DC | PRN

## 2021-08-23 MED ORDER — COCONUT OIL OIL: 1.0000 "application " | TOPICAL_OIL | Status: DC | PRN

## 2021-08-23 MED ORDER — NIFEDIPINE ER OSMOTIC RELEASE 30 MG PO TB24
30.0000 mg | ORAL_TABLET | Freq: Every day | ORAL | Status: DC
  Administered 2021-08-23 – 2021-08-25 (×3): 30 mg via ORAL
  Filled 2021-08-23 (×3): qty 1

## 2021-08-23 MED ORDER — FENTANYL-BUPIVACAINE-NACL 0.5-0.125-0.9 MG/250ML-% EP SOLN: 12.0000 mL/h | EPIDURAL | Status: DC | PRN

## 2021-08-23 MED ORDER — IBUPROFEN 600 MG PO TABS
600.0000 mg | ORAL_TABLET | Freq: Four times a day (QID) | ORAL | Status: DC
  Administered 2021-08-23 – 2021-08-25 (×7): 600 mg via ORAL
  Filled 2021-08-23 (×8): qty 1

## 2021-08-23 MED ORDER — PHENYLEPHRINE 40 MCG/ML (10ML) SYRINGE FOR IV PUSH (FOR BLOOD PRESSURE SUPPORT)
80.0000 ug | PREFILLED_SYRINGE | INTRAVENOUS | Status: DC | PRN
  Filled 2021-08-23: qty 10

## 2021-08-23 MED ORDER — FENTANYL-BUPIVACAINE-NACL 0.5-0.125-0.9 MG/250ML-% EP SOLN
12.0000 mL/h | EPIDURAL | Status: DC | PRN
  Administered 2021-08-23: 12 mL/h via EPIDURAL
  Filled 2021-08-23: qty 250

## 2021-08-23 MED ORDER — DIPHENHYDRAMINE HCL 25 MG PO CAPS: 25.0000 mg | ORAL_CAPSULE | Freq: Four times a day (QID) | ORAL | Status: DC | PRN

## 2021-08-23 MED ORDER — SENNOSIDES-DOCUSATE SODIUM 8.6-50 MG PO TABS
2.0000 | ORAL_TABLET | ORAL | Status: DC
  Administered 2021-08-24 – 2021-08-25 (×2): 2 via ORAL
  Filled 2021-08-23 (×2): qty 2

## 2021-08-23 MED ORDER — DIBUCAINE (PERIANAL) 1 % EX OINT: 1.0000 "application " | TOPICAL_OINTMENT | CUTANEOUS | Status: DC | PRN

## 2021-08-23 MED ORDER — WITCH HAZEL-GLYCERIN EX PADS: 1.0000 "application " | MEDICATED_PAD | CUTANEOUS | Status: DC | PRN

## 2021-08-23 MED ORDER — PHENYLEPHRINE 40 MCG/ML (10ML) SYRINGE FOR IV PUSH (FOR BLOOD PRESSURE SUPPORT): 80.0000 ug | PREFILLED_SYRINGE | INTRAVENOUS | Status: DC | PRN

## 2021-08-23 MED ORDER — FUROSEMIDE 20 MG PO TABS
20.0000 mg | ORAL_TABLET | Freq: Two times a day (BID) | ORAL | Status: DC
  Administered 2021-08-24 – 2021-08-25 (×3): 20 mg via ORAL
  Filled 2021-08-23 (×3): qty 1

## 2021-08-23 MED ORDER — LACTATED RINGERS IV SOLN: 500.0000 mL | Freq: Once | INTRAVENOUS | Status: DC

## 2021-08-23 MED ORDER — DIPHENHYDRAMINE HCL 50 MG/ML IJ SOLN: 12.5000 mg | INTRAMUSCULAR | Status: DC | PRN

## 2021-08-23 MED ORDER — SIMETHICONE 80 MG PO CHEW: 80.0000 mg | CHEWABLE_TABLET | ORAL | Status: DC | PRN

## 2021-08-23 MED ORDER — MISOPROSTOL 50MCG HALF TABLET
50.0000 ug | ORAL_TABLET | ORAL | Status: DC | PRN
  Administered 2021-08-23: 50 ug via BUCCAL
  Filled 2021-08-23: qty 1

## 2021-08-23 MED ORDER — PRENATAL MULTIVITAMIN CH
1.0000 | ORAL_TABLET | Freq: Every day | ORAL | Status: DC
  Administered 2021-08-23 – 2021-08-25 (×3): 1 via ORAL
  Filled 2021-08-23 (×3): qty 1

## 2021-08-23 MED ORDER — TETANUS-DIPHTH-ACELL PERTUSSIS 5-2.5-18.5 LF-MCG/0.5 IM SUSY: 0.5000 mL | PREFILLED_SYRINGE | Freq: Once | INTRAMUSCULAR | Status: DC

## 2021-08-23 MED ORDER — MEASLES, MUMPS & RUBELLA VAC IJ SOLR: 0.5000 mL | Freq: Once | INTRAMUSCULAR | Status: DC

## 2021-08-23 MED ORDER — OXYTOCIN-SODIUM CHLORIDE 30-0.9 UT/500ML-% IV SOLN
1.0000 m[IU]/min | INTRAVENOUS | Status: DC
  Filled 2021-08-23: qty 500

## 2021-08-23 MED ORDER — LIDOCAINE HCL (PF) 1 % IJ SOLN
INTRAMUSCULAR | Status: DC | PRN
  Administered 2021-08-23: 8 mL via EPIDURAL

## 2021-08-23 MED ORDER — ONDANSETRON HCL 4 MG PO TABS: 4.0000 mg | ORAL_TABLET | ORAL | Status: DC | PRN

## 2021-08-23 MED ORDER — ONDANSETRON HCL 4 MG/2ML IJ SOLN: 4.0000 mg | INTRAMUSCULAR | Status: DC | PRN

## 2021-08-23 MED ORDER — ACETAMINOPHEN 325 MG PO TABS
650.0000 mg | ORAL_TABLET | ORAL | Status: DC | PRN
  Administered 2021-08-23: 650 mg via ORAL
  Filled 2021-08-23: qty 2

## 2021-08-23 MED ORDER — BENZOCAINE-MENTHOL 20-0.5 % EX AERO: 1.0000 "application " | INHALATION_SPRAY | CUTANEOUS | Status: DC | PRN

## 2021-08-23 MED ORDER — RHO D IMMUNE GLOBULIN 1500 UNIT/2ML IJ SOSY
300.0000 ug | PREFILLED_SYRINGE | Freq: Once | INTRAMUSCULAR | Status: AC
  Administered 2021-08-23: 300 ug via INTRAVENOUS
  Filled 2021-08-23: qty 2

## 2021-08-23 NOTE — Progress Notes (Signed)
Patient complaining of pain and cramping and unable to urinate.  RN used bladder scan and obtained greater than 984.  Patient was intermittently catheterized and drained from bladder.

## 2021-08-23 NOTE — Anesthesia Procedure Notes (Signed)
Epidural Patient location during procedure: OB Start time: 08/23/2021 3:36 AM End time: 08/23/2021 3:40 AM  Staffing Anesthesiologist: Bethena Midget, MD  Preanesthetic Checklist Completed: patient identified, IV checked, site marked, risks and benefits discussed, surgical consent, monitors and equipment checked, pre-op evaluation and timeout performed  Epidural Patient position: sitting Prep: DuraPrep and site prepped and draped Patient monitoring: continuous pulse ox and blood pressure Approach: midline Location: L4-L5 Injection technique: LOR air  Needle:  Needle type: Tuohy  Needle gauge: 17 G Needle length: 9 cm and 9 Needle insertion depth: 6 cm Catheter type: closed end flexible Catheter size: 19 Gauge Catheter at skin depth: 12 cm Test dose: negative  Assessment Events: blood not aspirated, injection not painful, no injection resistance, no paresthesia and negative IV test

## 2021-08-23 NOTE — Lactation Note (Signed)
This note was copied from a baby's chart. Lactation Consultation Note Mom called out for LC. Mom awake, LC woke baby and stimulated to feed. Set up pillows in football position. Hand expression taught w/colostrum easily expressed. Mom stated that isn't the problem. The problem is I can't get her to latch on. Breast are very compressible. Praised mom. Mom stated that she was new at all of this and didn't know anything. Explained to mom that was OK the baby is learning to. Baby latched and suckled a few suckles the back to sleep. Couldn't get baby to do anything else. Swaddled baby, placed in crib. Asked mom if she would be interested in pumping, mom stated sure. Mom stated she needed to go to the rest room. LC set up DEBP while mom in BR. Spoke w/mom through door, asking her to call for next feeding. Mom stated OK. Mom is having trouble voiding. RN aware of mom in BR.  Patient Name: Brandy Ruiz [id number] Date: 08/23/2021 Reason for consult: Mother's request;Difficult latch;Primapara Age:16 hours  Maternal Data Has patient been taught Hand Expression?: Yes Does the patient have breastfeeding experience prior to this delivery?: No  Feeding    LATCH Score Latch: Too sleepy or reluctant, no latch achieved, no sucking elicited.  Audible Swallowing: None  Type of Nipple: Flat  Comfort (Breast/Nipple): Soft / non-tender  Hold (Positioning): Full assist, staff holds infant at breast  LATCH Score: 3   Lactation Tools Discussed/Used Tools: Shells;Pump Breast pump type: Double-Electric Breast Pump Reason for Pumping: flat  Interventions    Discharge    Consult Status Consult Status: Follow-up Date: 08/23/21 Follow-up type: In-patient    Charyl Dancer 08/23/2021, 5:01 PM

## 2021-08-23 NOTE — Anesthesia Preprocedure Evaluation (Signed)
Anesthesia Evaluation  Patient identified by MRN, date of birth, ID band Patient awake    Reviewed: Allergy & Precautions, H&P , NPO status , Patient's Chart, lab work & pertinent test results, reviewed documented beta blocker date and time   Airway Mallampati: III  TM Distance: >3 FB Neck ROM: full    Dental no notable dental hx. (+) Teeth Intact, Dental Advisory Given   Pulmonary asthma , former smoker,    Pulmonary exam normal breath sounds clear to auscultation       Cardiovascular hypertension, Normal cardiovascular exam Rhythm:regular Rate:Normal     Neuro/Psych PSYCHIATRIC DISORDERS Anxiety Depression Bipolar Disorder negative neurological ROS     GI/Hepatic negative GI ROS, Neg liver ROS,   Endo/Other  negative endocrine ROS  Renal/GU negative Renal ROS  negative genitourinary   Musculoskeletal   Abdominal   Peds  Hematology negative hematology ROS (+)   Anesthesia Other Findings   Reproductive/Obstetrics (+) Pregnancy                             Anesthesia Physical Anesthesia Plan  ASA: 3  Anesthesia Plan: Epidural   Post-op Pain Management:    Induction:   PONV Risk Score and Plan: 2 and Treatment may vary due to age or medical condition  Airway Management Planned: Natural Airway  Additional Equipment: None  Intra-op Plan:   Post-operative Plan:   Informed Consent: I have reviewed the patients History and Physical, chart, labs and discussed the procedure including the risks, benefits and alternatives for the proposed anesthesia with the patient or authorized representative who has indicated his/her understanding and acceptance.     Dental Advisory Given  Plan Discussed with:   Anesthesia Plan Comments: (Labs checked- platelets confirmed with RN in room. Fetal heart tracing, per RN, reported to be stable enough for sitting procedure. Discussed epidural, and  patient consents to the procedure:  included risk of possible headache,backache, failed block, allergic reaction, and nerve injury. This patient was asked if she had any questions or concerns before the procedure started.)        Anesthesia Quick Evaluation

## 2021-08-23 NOTE — Lactation Note (Signed)
This note was copied from a baby's chart. Lactation Consultation Note  Patient Name: Brandy Ruiz [id number] Date: 08/23/2021   Age:29 hours  Mom is a P1. Mom reports having attempted to feed with cues prior to my entry into the room, but Mom reports then infant would no longer show interest. I informed Mom that was normal for initial newborn behavior.   Infant was not interested in feeding during my time in the room. Infant was swaddled & sleeping, being held by an aunt. Infant was sleeping with tongue resting on the roof of her mouth. Mom is aware we will visit her when she goes to her postpartum room.   Mom's chart refers to use of buspirone (L3) 10 mg tid. I am unsure if this is current or past use.   Lurline Hare [facility] 08/23/2021, 11:27 AM

## 2021-08-23 NOTE — Progress Notes (Signed)
On admission, patient's BP 149/83.  At 1 hour assessment patient's BP increased to 170/87.  Donette Larry, CNM notified as both blood pressures are above parameters and patient was an induction for [facility].

## 2021-08-23 NOTE — Progress Notes (Signed)
Labor Progress Note Brandy Ruiz is a 30 y.o. G2P0010 at [redacted]w[redacted]d presented for IOL due to chronic hypertension.   S: Called by RN that patient was feeling quite uncomfortable. Evaluated at bedside. Patient reports her FB came out about 2 hours ago and also has had several gushes of clear fluid since then.   O:  BP 135/74    Pulse 74    Temp 98.8 F (37.1 C)    Resp 20    Ht 5\' 5"  (1.651 m)    Wt 113.9 kg    LMP 11/22/2020    SpO2 97%    BMI 41.79 kg/m  EFM: 140/mod/15x15/none  CVE: Dilation: 6 Effacement (%): 80 Station: -2 Exam by:: Dr. [email or web address]   A&P: 30 y.o. G2P0010 [redacted]w[redacted]d  #Labor: Progressing well and quite uncomfortable, would like epidural ASAP. Anesthesia on their way for placement. Will allow expectant management for now.  #Pain: Planning for epidural #FWB: Cat 1  #GBS negative  #Chronic hypertension: BP currently WNL. Asymptomatic. Pre-e labs WNL. Cont to monitor.   [redacted]w[redacted]d, DO 4:19 AM

## 2021-08-23 NOTE — Progress Notes (Signed)
Labor Progress Note Brandy Ruiz is a 30 y.o. G2P0010 at [redacted]w[redacted]d presented for IOL d/t cHTN.   S: Feeling much better after epidural, resting.  O:  BP 126/66    Pulse 80    Temp 98.2 F (36.8 C) (Oral)    Resp 16    Ht 5\' 5"  (1.651 m)    Wt 113.9 kg    LMP 11/22/2020    SpO2 97%    BMI 41.79 kg/m  EFM: 135/min to mod/none/shallow decelerations  CVE: Dilation: 5 Effacement (%): 90 Station: -2 Exam by:: [email or web address]   A&P: 30 y.o. G2P0010 [redacted]w[redacted]d  #Labor: Largely unchanged since last check about 1 hour ago with difficulty tracing contractions, after verbal consent placed IUPC. Pending contraction pattern, will start pit 2x2. Placing into left lateral with peanut.  #Pain: Epidural in place  #FWB: Cat II>> occasional shallow decelerations, unable to clearly discern if early vs lates> IUPC placed  #GBS negative   [redacted]w[redacted]d, DO 5:44 AM

## 2021-08-23 NOTE — Lactation Note (Signed)
This note was copied from a baby's chart. Lactation Consultation Note Attempted to see mom before end of shift. Mom is ordering food. Would like to see Lactation in am.  Patient Name: Brandy Ruiz [id number] Date: 08/23/2021 Reason for consult: Mother's request;Difficult latch;Primapara Age:29 hours  Maternal Data Has patient been taught Hand Expression?: Yes Does the patient have breastfeeding experience prior to this delivery?: No  Feeding    LATCH Score Latch: Too sleepy or reluctant, no latch achieved, no sucking elicited.  Audible Swallowing: None  Type of Nipple: Flat  Comfort (Breast/Nipple): Soft / non-tender  Hold (Positioning): Full assist, staff holds infant at breast  LATCH Score: 3   Lactation Tools Discussed/Used Tools: Shells;Pump Breast pump type: Double-Electric Breast Pump Reason for Pumping: flat  Interventions    Discharge    Consult Status Consult Status: Follow-up Date: 08/23/21 Follow-up type: In-patient    Charyl Dancer 08/23/2021, 7:31 PM

## 2021-08-23 NOTE — Progress Notes (Signed)
Pt made aware that her urethral catheter is latex but that no rash or edema is noted.  Pt prefers to leave catheter in instead of replacing at this time.

## 2021-08-23 NOTE — Lactation Note (Signed)
This note was copied from a baby's chart. Lactation Consultation Note Mom called for Lactation assistance. LC unavailable at that time. LC went to room, FOB was taking baby and placing her in crib. Mom stated she's sleeping now and that she was about to go to sleep.  Mom stated she couldn't figure out how to BF. Asked mom to call for [facility] for next feeding.  Patient Name: Girl Gurtha Picker [id number] Date: 08/23/2021   Age:29 hours  Maternal Data    Feeding    LATCH Score                    Lactation Tools Discussed/Used    Interventions    Discharge    Consult Status      Charyl Dancer 08/23/2021, 3:54 PM

## 2021-08-23 NOTE — Discharge Summary (Signed)
Postpartum Discharge Summary     Patient Name: Brandy Ruiz DOB: [birth year] MRN: [id number]  Date of admission: 08/22/2021 Delivery date:08/23/2021  Delivering provider: Julianne Handler  Date of discharge: 08/25/2021  Admitting diagnosis: Chronic hypertension affecting pregnancy [O10.919] Intrauterine pregnancy: [redacted]w[redacted]d    Secondary diagnosis:  Principal Problem:   SVD (spontaneous vaginal delivery) Active Problems:   Encounter for supervision of normal pregnancy in first trimester   B[place] 1 disorder ([place]   Anxiety during pregnancy   Tobacco smoking affecting pregnancy   Rh negative state in antepartum period   Chronic hypertension affecting pregnancy  Additional problems: None    Discharge diagnosis: Term Pregnancy Delivered and CHTN                                              Post partum procedures: Rhogam given Augmentation: Cytotec and IP Foley Complications: None  Hospital course: Induction of Labor With Vaginal Delivery   30y.o. yo G2P0010 at 316w1das admitted to the hospital 08/22/2021 for induction of labor.  Indication for induction:  CHTN .  Patient had an uncomplicated labor course as follows: Membrane Rupture Time/Date: 3:26 AM ,08/23/2021   Delivery Method:Vaginal, Spontaneous  Episiotomy: None  Lacerations:  Periurethral  Details of delivery can be found in separate delivery note.  Patient had a routine postpartum course and is meeting all milestones. She was started on Procardia 30 mg for BP control and was also start on oral Lasix will good result. She will continue these upon discharge and will have a BP check in 1 week. Rhogam given postpartum. All questions and concerns addressed. Patient is discharged home 08/25/21.  Newborn Data: Birth date:08/23/2021  Birth time:9:55 AM  Gender:Female  Living status:Living  Apgars:8 ,9  Weight:3265 g   Magnesium Sulfate received: No BMZ received: No Rhophylac: Yes MMR: N/A T-DaP: Declined Flu:  Yes Transfusion: No  Physical exam  Vitals:   08/24/21 1757 08/24/21 2222 08/25/21 0637 08/25/21 1211  BP: 140/79 131/77 123/65   Pulse:  88 88   Resp:  18 16   Temp:  98.5 F (36.9 C) 98.1 F (36.7 C)   TempSrc:  Oral Oral   SpO2:  99% 99% 100%  Weight:      Height:       General: alert, cooperative, and no distress Lochia: appropriate Uterine Fundus: firm and below umbilicus  DVT Evaluation: no LE edema or calf tenderness to palpation  Labs: Lab Results  Component Value Date   WBC 15.8 (H) 08/23/2021   HGB 11.8 (L) 08/23/2021   HCT 36.5 08/23/2021   MCV 81.8 08/23/2021   PLT 173 08/23/2021   CMP Latest Ref Rng & Units 08/22/2021  Glucose 70 - 99 mg/dL 88  BUN 6 - 20 mg/dL 10  Creatinine 0.44 - 1.00 mg/dL 0.91  Sodium 135 - 145 mmol/L 132(L)  Potassium 3.5 - 5.1 mmol/L 4.1  Chloride 98 - 111 mmol/L 105  CO2 22 - 32 mmol/L 18(L)  Calcium 8.9 - 10.3 mg/dL 8.5(L)  Total Protein 6.5 - 8.1 g/dL 5.6(L)  Total Bilirubin 0.3 - 1.2 mg/dL 0.5  Alkaline Phos 38 - 126 U/L 167(H)  AST 15 - 41 U/L 20  ALT 0 - 44 U/L 10   Edinburgh Score: Edinburgh Postnatal Depression Scale Screening Tool 08/24/2021  I have been able to laugh  and see the funny side of things. 0  I have looked forward with enjoyment to things. 0  I have blamed myself unnecessarily when things went wrong. 3  I have been anxious or worried for no good reason. 3  I have felt scared or panicky for no good reason. 2  Things have been getting on top of me. 2  I have been so unhappy that I have had difficulty sleeping. 0  I have felt sad or miserable. 1  I have been so unhappy that I have been crying. 1  The thought of harming myself has occurred to me. 0  Edinburgh Postnatal Depression Scale Total 12     After visit meds:  Allergies as of 08/25/2021       Reactions   Shellfish Allergy Anaphylaxis   Amoxicillin Hives   Other    I'm not supposed to take Beta-blockers.   Penicillins    "I turn pink."    Latex Rash        Medication List     STOP taking these medications    Doxylamine-Pyridoxine 10-10 MG Tbec Commonly known as: Diclegis   lamoTRIgine 100 MG tablet Commonly known as: LAMICTAL   ondansetron 4 MG disintegrating tablet Commonly known as: Zofran ODT   pantoprazole 40 MG tablet Commonly known as: Protonix       TAKE these medications    acetaminophen 500 MG tablet Commonly known as: TYLENOL Take 2 tablets (1,000 mg total) by mouth every 8 (eight) hours as needed (pain).   albuterol 108 (90 Base) MCG/ACT inhaler Commonly known as: VENTOLIN HFA Inhale 2 puffs into the lungs every 4 (four) hours as needed.   Blood Pressure Kit Devi Check blood pressure 1-2 per week   busPIRone 10 MG tablet Commonly known as: BUSPAR Take 1 tablet (10 mg total) by mouth 3 (three) times daily as needed.   EPINEPHrine 0.3 mg/0.3 mL Soaj injection Commonly known as: EPI-PEN INJECT FOR SEVERE ALLERGIC REACTION AS DIRECTED   furosemide 20 MG tablet Commonly known as: LASIX Take 1 tablet (20 mg total) by mouth daily for 3 days.   ibuprofen 600 MG tablet Commonly known as: ADVIL Take 1 tablet (600 mg total) by mouth every 6 (six) hours as needed (pain).   NIFEdipine 30 MG 24 hr tablet Commonly known as: ADALAT CC Take 1 tablet (30 mg total) by mouth daily. Start taking on: August 26, 2021   PrePLUS 27-1 MG Tabs Take 1 tablet by mouth daily.         Discharge home in stable condition Infant Feeding: Breast Infant Disposition: home with mother Discharge instruction: per After Visit Summary and Postpartum booklet. Activity: Advance as tolerated. Pelvic rest for 6 weeks.  Diet: routine diet Future Appointments: Future Appointments  Date Time Provider Department Center  09/02/2021 10:35 AM Donnamae Jude, MD [facility] None  10/06/2021  3:50 PM Constant, Vickii Chafe, MD [place] None   Follow up Visit: Please schedule this patient for Postpartum visit in: 6 weeks  with the following provider: Any provider Virtual For C/S patients schedule nurse incision check in weeks 2 weeks: No High risk pregnancy complicated by: HTN Delivery mode:  SVD Anticipated Birth Control:  Undecided; will discuss at postpartum visit  PP Procedures needed: BP check  Schedule Integrated [place] visit: Yes - message sent by Dr. Gwenlyn Perking on 08/25/21.  08/25/2021 Genia Del, MD

## 2021-08-24 LAB — RH IG WORKUP (INCLUDES ABO/RH)
Fetal Screen: NEGATIVE
Gestational Age(Wks): 39
Unit division: 0

## 2021-08-24 MED ORDER — PNEUMOCOCCAL VAC POLYVALENT 25 MCG/0.5ML IJ INJ
0.5000 mL | INJECTION | INTRAMUSCULAR | Status: AC
  Administered 2021-08-25: 15:00:00 0.5 mL via INTRAMUSCULAR
  Filled 2021-08-24 (×2): qty 0.5

## 2021-08-24 NOTE — Lactation Note (Signed)
This note was copied from a baby's chart. Lactation Consultation Note  Patient Name: Brandy Ruiz ASNKN'L Date: 08/24/2021 Reason for consult: Follow-up assessment;Term;Primapara;1st time breastfeeding;Difficult latch Age:29 hours   Lactation Follow Up Consult:  RN requested assistance; difficulty waking baby  Baby "Armani" was asleep in mother's arms when I arrived.  She is now > 24 hours old.  Offered to assist with latching and mother receptive.  Removed undershirt and provided gentle stimulation to help awaken "Armani."  Mother had a #20 NS at bedside, however, I noted this to be too small and provided a #24 NS.  Educated mother on correct placement.  Assisted "Armani" to latch after a few attempts.  She remained sleepy.  With constant stimulation she was able to breast feed for 14 minutes.  Stressed the importance of parents waking her and calling for assistance if they are not able to awaken and feed her. Reviewed breast feeding basics.  Mother verbalized understanding.    Previous LC set up a DEBP; mother has not been pumping yet.  She currently has a visitor and should pump after the visitor leaves.  Taught father how to assist mother with keeping "Armani" awake and actively feeding at the breast.  RN updated.   Maternal Data Has patient been taught Hand Expression?: Yes Does the patient have breastfeeding experience prior to this delivery?: No  Feeding Mother's Current Feeding Choice: Breast Milk  LATCH Score Latch: Repeated attempts needed to sustain latch, nipple held in mouth throughout feeding, stimulation needed to elicit sucking reflex.  Audible Swallowing: None  Type of Nipple: Everted at rest and after stimulation (Using a NS)  Comfort (Breast/Nipple): Soft / non-tender  Hold (Positioning): Assistance needed to correctly position infant at breast and maintain latch.  LATCH Score: 6   Lactation Tools Discussed/Used Tools: Nipple  Brandy Ruiz;Shells Nipple shield size: 24 Breast pump type: Double-Electric Breast Pump;Manual Reason for Pumping: Nipple shield use; breast stimulation Pumping frequency: Every three hours  Interventions Interventions: Breast feeding basics reviewed;Assisted with latch;Skin to skin;Breast massage;Hand express;Breast compression;Hand pump;Shells;Position options;Support pillows;Adjust position;Education  Discharge Pump: Personal;Manual;DEBP (Mother planning on calling [facility] for a DEBP) WIC Program: Yes  Consult Status Consult Status: Follow-up Date: 08/25/21 Follow-up type: In-patient    Brandy Ruiz 08/24/2021, 11:43 AM

## 2021-08-24 NOTE — Progress Notes (Signed)
Post Partum Day 1 Subjective: no complaints, up ad lib, voiding, and tolerating PO  Objective: Blood pressure 140/79, pulse 83, temperature 98.5 F (36.9 C), temperature source Oral, resp. rate 16, height 5\' 5"  (1.651 m), weight 113.9 kg, last menstrual period 11/22/2020, SpO2 99 %, unknown if currently breastfeeding.  Physical Exam:  General: alert, cooperative, and no distress Lochia: appropriate Uterine Fundus: firm Incision: n/a DVT Evaluation: No evidence of DVT seen on physical exam.  Recent Labs    08/22/21 2005 08/23/21 0417  HGB 11.3* 11.8*  HCT 35.9* 36.5    Assessment/Plan: Plan for discharge tomorrow, Breastfeeding, Lactation consult, and Contraception POPs   LOS: 2 days   08/25/21 08/24/2021, 6:11 PM

## 2021-08-24 NOTE — Anesthesia Postprocedure Evaluation (Signed)
Anesthesia Post Note  Patient: Brandy Ruiz  Procedure(s) Performed: AN AD HOC LABOR EPIDURAL     Patient location during evaluation: Mother Baby Anesthesia Type: Epidural Level of consciousness: awake and alert Pain management: pain level controlled Vital Signs Assessment: post-procedure vital signs reviewed and stable Respiratory status: spontaneous breathing, nonlabored ventilation and respiratory function stable Cardiovascular status: stable Postop Assessment: no headache, no backache, epidural receding, no apparent nausea or vomiting, patient able to bend at knees, adequate PO intake and able to ambulate Anesthetic complications: no   No notable events documented.  Last Vitals:  Vitals:   08/23/21 2349 08/24/21 0536  BP: (!) 115/58 137/78  Pulse: 78 83  Resp: 16 20  Temp: 37.1 C 36.7 C  SpO2: 99% 98%    Last Pain:  Vitals:   08/24/21 0536  TempSrc: Oral  PainSc: 0-No pain   Pain Goal:                   [employer]

## 2021-08-25 ENCOUNTER — Other Ambulatory Visit (HOSPITAL_COMMUNITY): Payer: Self-pay

## 2021-08-25 ENCOUNTER — Telehealth (HOSPITAL_COMMUNITY): Payer: Self-pay | Admitting: *Deleted

## 2021-08-25 DIAGNOSIS — Z1331 Encounter for screening for depression: Secondary | ICD-10-CM

## 2021-08-25 MED ORDER — NIFEDIPINE ER 30 MG PO TB24
30.0000 mg | ORAL_TABLET | Freq: Every day | ORAL | 0 refills | Status: DC
  Filled 2021-08-25: qty 30, 30d supply, fill #0

## 2021-08-25 MED ORDER — ACETAMINOPHEN 500 MG PO TABS
1000.0000 mg | ORAL_TABLET | Freq: Three times a day (TID) | ORAL | 0 refills | Status: DC | PRN
  Filled 2021-08-25: qty 60, 10d supply, fill #0

## 2021-08-25 MED ORDER — IBUPROFEN 600 MG PO TABS
600.0000 mg | ORAL_TABLET | Freq: Four times a day (QID) | ORAL | 0 refills | Status: DC | PRN
  Filled 2021-08-25: qty 40, 10d supply, fill #0

## 2021-08-25 MED ORDER — FUROSEMIDE 20 MG PO TABS
20.0000 mg | ORAL_TABLET | Freq: Every day | ORAL | 0 refills | Status: DC
  Filled 2021-08-25: qty 3, 3d supply, fill #0

## 2021-08-25 NOTE — Lactation Note (Signed)
This note was copied from a baby's chart. Lactation Consultation Note  Patient Name: Brandy Ruiz QQPYP'P Date: 08/25/2021 Reason for consult: Follow-up assessment;Primapara;1st time breastfeeding;Term Age:29 hours   Lactation Follow Up Consult:  Baby "Armani" was awake, alert and quiet in her bassinet when I arrived.  Mother had just finished feeding her.  Mother had no questions/concerns and feels like "Brandy Ruiz" is latching well.  Mother has been using a NS, however, I suggested she attempt to latch without it today since her nipples are everted and baby is much more awake today.  Reviewed placement if mother does desire to continue using it.  Reminded her that the breasts are not stimulated as well with NS use and mentioned pumping yesterday.  Mother has not been pumping but is eager to latch without the NS.  She has our OP phone number for any questions after discharge.  Mother has a DEBP for home use.  Father present and asleep.   Maternal Data    Feeding Mother's Current Feeding Choice: Breast Milk  LATCH Score                    Lactation Tools Discussed/Used    Interventions Interventions: Education  Discharge Discharge Education: Engorgement and breast care Pump: Manual;DEBP;Personal (Mother will obtain a WIC DEBP) WIC Program: Yes  Consult Status Consult Status: Complete Date: 08/25/21 Follow-up type: Call as needed    Angelita Harnack R Lando Alcalde 08/25/2021, 7:59 AM

## 2021-08-25 NOTE — Telephone Encounter (Signed)
Inpatient SW Referral for [facility] with EPDS=12.  Duffy Rhody, RN 08-25-2021 at 2:16pm

## 2021-08-25 NOTE — Clinical Social Work Maternal (Signed)
CLINICAL SOCIAL WORK MATERNAL/CHILD NOTE  Patient Details  Name: Brandy Ruiz MRN: [id number] Date of Birth: 08/23/2020  Date:  08/25/2021  Clinical Social Worker Initiating Note:  Darra Lis, [place] Date/Time: Initiated:  08/25/21/0945     Child's Name:  Brandy Ruiz   Biological Parents:  Mother, Father Brandy Ruiz 03/12/1988)   Need for Interpreter:  None   Reason for Referral:  Behavioral Health Concerns   Address:  [place] [address]    Phone number:  [phone] (home)     Additional phone number:   Household Members/Support Persons (HM/SP):   Household Member/Support Person 1   HM/SP Name Relationship DOB or Age  HM/SP -1 Brandy Ruiz Significant Other 03/12/1988  HM/SP -2        HM/SP -3        HM/SP -4        HM/SP -5        HM/SP -6        HM/SP -7        HM/SP -8          Natural Supports (not living in the home):  Friends, Immediate Family   Professional Supports: None   Employment: Full-time   Type of Work: Agricultural consultant [employer]   Education:  Forensic psychologist   Homebound arranged:    Museum/gallery curator Resources:  Other (Comment) Designer, multimedia)   Other Resources:  [facility]   Cultural/Religious Considerations Which May Impact Care:    Strengths:  Ability to meet basic needs  , Home prepared for child  , Understanding of illness, Psychotropic Medications   Psychotropic Medications:  Buspar      Pediatrician:       Pediatrician List:   [facility]      Pediatrician Fax Number:    Risk Factors/Current Problems:  None   Cognitive State:  Alert  , Linear Thinking  , Goal Oriented  , Insightful     Mood/Affect:  Calm  , Interested     CSW Assessment: CSW consulted for history of anxiety, depression, bipolar and Edinburgh of 12. CSW met with MOB to assess and offer support. CSW introduced self and  role. CSW observed MOB holding infant and FOB bedside. CSW offered to speak with MOB in private, however MOB declined to have FOB leave the room for assessment. CSW informed MOB of reason for consult and assessed current mood. MOB reported she is currently doing fine, jokingly stating her pregnancy was long. CSW inquired about MOB mental health history. MOB disclosed she has a diagnosis of anxiety, depression, PTSD and manic bipolar. MOB reported she was diagnosed about 10 years ago. MOB stated she was taking Buspar, however her insurnance changed during the pregnancy and that she is "working on the medication", due to insurance changes. MOB currently does not take medication for her manic bipolar diagnosis, due to getting insurance worked out. CSW asked MOB when she last had a manic episode. MOB reported she last had an episode over a year ago. CSW asked MOB how she coped. MOB stated the episode passed on its own. CSW asked MOB if she has a plan in place in case mental health needs arise. MOB reported stated she has an appointment with [place] and is going to be following up once she is discharged. MOB shared  she previously went to therapy with [place], which she found to be helpful. MOB expressed that her mood is currently stable and she feels comfortable seeking professional help if needs arise. MOB identified FOB, family and friends as primary supports. MOB denies any current SI or HI. MOB did not display any acute mental health symptoms and appeared appropriate during assessment.  MOB is employed full-time and receives [employer] benefits. MOB aware to contact [place] to have infant added to benefits.   CSW provided education regarding the baby blues period versus PPD and provided resources. CSW provided the New Mom Checklist and again encouraged MOB to self evaluate and contact a medical professional if symptoms are noted at any time.   CSW provided review of Sudden Infant Death  Syndrome (SIDS) precautions. MOB stated she has all infant needs, including a car seat and bassinet. MOB is still identifying a pediatrician in-network of insurance but denies any transportation barriers. CSW made MOB aware of [facility] as a resource, however MOB reported she has no additional resources needs at this time.  CSW identifies no further need for intervention and no barriers to discharge at this time.  CSW Plan/Description:  No Further Intervention Required/No Barriers to Discharge, [place], Child Protective Service Report  , CSW Will Continue to Monitor Umbilical Cord Tissue Drug Screen Results and Make Report if Warranted, Perinatal Mood and Anxiety Disorder (PMADs) Education, Other Information/Referral to [employer], Sudden Infant Death Syndrome (SIDS) Education    Waylan Boga, [place] 08/25/2021, 10:10 AM

## 2021-08-26 ENCOUNTER — Telehealth: Payer: Self-pay | Admitting: Clinical

## 2021-08-26 NOTE — Telephone Encounter (Signed)
Attempt to schedule pt, per referral; Left HIPPA-compliant message to call back Ariyel Jeangilles from [facility] at  [phone] (Arbadella Kimbler's office); left MyChart message for pt.  °

## 2021-08-27 ENCOUNTER — Encounter (HOSPITAL_COMMUNITY): Payer: PRIVATE HEALTH INSURANCE

## 2021-09-02 ENCOUNTER — Other Ambulatory Visit: Payer: Self-pay

## 2021-09-02 ENCOUNTER — Encounter: Payer: Self-pay | Admitting: Family Medicine

## 2021-09-02 ENCOUNTER — Ambulatory Visit (INDEPENDENT_AMBULATORY_CARE_PROVIDER_SITE_OTHER): Payer: PRIVATE HEALTH INSURANCE | Admitting: Family Medicine

## 2021-09-02 DIAGNOSIS — O1093 Unspecified pre-existing hypertension complicating the puerperium: Secondary | ICD-10-CM

## 2021-09-02 DIAGNOSIS — F319 Bipolar disorder, unspecified: Secondary | ICD-10-CM

## 2021-09-02 DIAGNOSIS — O10919 Unspecified pre-existing hypertension complicating pregnancy, unspecified trimester: Secondary | ICD-10-CM

## 2021-09-02 NOTE — Assessment & Plan Note (Signed)
No issues thus far

## 2021-09-02 NOTE — Assessment & Plan Note (Signed)
BP is well controlled on Procardia

## 2021-09-02 NOTE — Progress Notes (Signed)
° °  Subjective:    Patient ID: Brandy Ruiz is a 30 y.o. female presenting with Hypertension (1 week PP BP check)  on 09/02/2021  HPI: Patient is 1 week pp from a vaginal delivery. She is s/p lasix. She is on Procardia. Breastfeeding is going well. Mood is good. Not getting a lot of sleep. Frustrated by trying to add baby to [employer]. Flowsheet Row Postpartum Visit from 09/02/2021 [facility]  PHQ-9 Total Score 1        Review of Systems  Constitutional:  Negative for chills and fever.  Respiratory:  Negative for shortness of breath.   Cardiovascular:  Negative for chest pain.  Gastrointestinal:  Negative for abdominal pain, nausea and vomiting.  Genitourinary:  Negative for dysuria.  Skin:  Negative for rash.     Objective:    BP 130/86    Pulse 85    Ht 5\' 5"  (1.651 m)    Wt 230 lb (104.3 kg)    LMP 11/22/2020    Breastfeeding Yes    BMI 38.27 kg/m  Physical Exam Constitutional:      General: She is not in acute distress.    Appearance: She is well-developed.  HENT:     Head: Normocephalic and atraumatic.  Eyes:     General: No scleral icterus. Cardiovascular:     Rate and Rhythm: Normal rate.  Pulmonary:     Effort: Pulmonary effort is normal.  Abdominal:     Palpations: Abdomen is soft.  Musculoskeletal:     Cervical back: Neck supple.  Skin:    General: Skin is warm and dry.  Neurological:     Mental Status: She is alert and oriented to person, place, and time.        Assessment & Plan:   Problem List Items Addressed This Visit       Unprioritized   Bipolar 1 disorder (HCC)    No issues thus far      Chronic hypertension affecting pregnancy    BP is well controlled on Procardia       Future Appointments  Date Time Provider Department Center  10/06/2021  3:50 PM Constant, 10/08/2021, MD [facility] None     Gigi Gin 09/02/2021 11:47 AM

## 2021-09-02 NOTE — Progress Notes (Signed)
1  Week PP presents for BP check and Mood check.  PHQ-9=1/ EPDS=0

## 2021-09-04 ENCOUNTER — Telehealth (HOSPITAL_COMMUNITY): Payer: Self-pay | Admitting: *Deleted

## 2021-09-04 NOTE — Telephone Encounter (Signed)
Hospital Discharge Follow-Up Call:  Patient reports that she is well and has no concerns about her healing process.  She requested that we call her back to complete EPDS as she had just gotten the baby to sleep and she wanted to sleep herself.  Patient says that baby is well and she has no concerns about baby's health.  She reports that baby sleeps in a bassinet.  ABCs of Safe Sleep reviewed.

## 2021-09-05 ENCOUNTER — Telehealth (HOSPITAL_COMMUNITY): Payer: Self-pay

## 2021-09-05 NOTE — Telephone Encounter (Signed)
Called to patient to do EPDS. Patient states that she just did this screening on 09/02/21 with [facility]. RN did not repeat EPDS at this time.   Marcelino Duster [facility] 09/05/2021,0913

## 2021-09-09 NOTE — Patient Instructions (Signed)
Maternity Assessment Unit (MAU)  The Maternity Assessment Unit (MAU) is located at the [facility] at [facility]. The address is: [address], [address], [place], [place] [address]. Please see map below for additional directions.    The Maternity Assessment Unit is designed to help you during your pregnancy, and for up to 6 weeks after delivery, with any pregnancy- or postpartum-related emergencies, if you think you are in labor, or if your water has broken. For example, if you experience nausea and vomiting, vaginal bleeding, severe abdominal or pelvic pain, elevated blood pressure or other problems related to your pregnancy or postpartum time, please come to the Maternity Assessment Unit for assistance.        

## 2021-09-15 ENCOUNTER — Telehealth: Payer: Self-pay

## 2021-09-15 NOTE — Telephone Encounter (Signed)
Please call us on [phone], we need additional information to complete your paperwork.

## 2021-10-06 ENCOUNTER — Other Ambulatory Visit: Payer: Self-pay

## 2021-10-06 ENCOUNTER — Ambulatory Visit (INDEPENDENT_AMBULATORY_CARE_PROVIDER_SITE_OTHER): Payer: PRIVATE HEALTH INSURANCE | Admitting: Obstetrics and Gynecology

## 2021-10-06 ENCOUNTER — Encounter: Payer: Self-pay | Admitting: Obstetrics and Gynecology

## 2021-10-06 MED ORDER — METOCLOPRAMIDE HCL 10 MG PO TABS: ORAL_TABLET | ORAL | 0 refills | Status: DC

## 2021-10-06 NOTE — Progress Notes (Signed)
° ° °  Post Partum Visit Note  Brandy Ruiz is a 31 y.o. G62P1011 female who presents for a postpartum visit. She is 6 weeks postpartum following a normal spontaneous vaginal delivery.  I have fully reviewed the prenatal and intrapartum course. The delivery was at 39.1 gestational weeks.  Anesthesia: epidural. Postpartum course has been uncomplicated. Baby is doing well yes. Baby is feeding by both breast and bottle - Enfamil AR. Bleeding no bleeding. Bowel function is normal. Bladder function is normal. Patient is not sexually active. Contraception method is none. Postpartum depression screening: negative.   The pregnancy intention screening data noted above was reviewed. Potential methods of contraception were discussed. The patient elected to proceed with No data recorded.    Health Maintenance Due  Topic Date Due   COVID-19 Vaccine (1) Never done   PAP-Cervical Cytology Screening  Never done   PAP SMEAR-Modifier  Never done   TETANUS/TDAP  04/21/2016       Review of Systems Pertinent items noted in HPI and remainder of comprehensive ROS otherwise negative.  Objective:  BP 124/86    Pulse 85    Ht 5\' 5"  (1.651 m)    Wt 232 lb 14.4 oz (105.6 kg)    LMP 11/22/2020    Breastfeeding Yes    BMI 38.76 kg/m    General:  alert, cooperative, and no distress   Breasts:  normal  Lungs: clear to auscultation bilaterally  Heart:  regular rate and rhythm  Abdomen: soft, non-tender; bowel sounds normal; no masses,  no organomegaly   Wound   GU exam:  not indicated       Assessment:    There are no diagnoses linked to this encounter.  Normal postpartum exam.   Plan:   Essential components of care per ACOG recommendations:  1.  Mood and well being: Patient with negative depression screening today. Reviewed local resources for support.  - Patient tobacco use? No.   - hx of drug use? No.    2. Infant care and feeding:  -Patient currently breastmilk feeding? Yes. Reviewed  importance of draining breast regularly to support lactation. Rx Reglan provided to help increase milk supply -Social determinants of health (SDOH) reviewed in EPIC. No concerns  3. Sexuality, contraception and birth spacing - Patient does not want a pregnancy in the next year.   - Reviewed forms of contraception in tiered fashion. Patient desired oral contraceptives (estrogen/progesterone) today with plans to start at a later date. She has a latex sensitivity and thus condoms are not an option for her at this time. - Discussed birth spacing of 18 months  4. Sleep and fatigue -Encouraged family/partner/community support of 4 hrs of uninterrupted sleep to help with mood and fatigue  5. Physical Recovery  - Discussed patients delivery and complications. She describes her labor as good. - Patient had a Vaginal, no problems at delivery. Perineal healing reviewed. Patient expressed understanding - Patient has urinary incontinence? No. - Patient is safe to resume physical and sexual activity  6.  Health Maintenance - HM due items addressed - TSH done per patient request - Last pap smear No results found for: DIAGPAP Pap smear not done at today's visit. Patient reports last pap 1.5 years ago- record requested -Breast Cancer screening indicated? No.   7. Chronic Disease/Pregnancy Condition follow up: Hypertension- stable without medication  - PCP follow up  11/24/2020, MD Center for [facility], [facility]

## 2021-10-06 NOTE — Progress Notes (Signed)
Pap at previous PCP approx 1-1.5 years ago. Normal. Patient will sign release for records.

## 2021-10-07 LAB — THYROID PANEL WITH TSH
Free Thyroxine Index: 1.7 (ref 1.2–4.9)
T3 Uptake Ratio: 25 % (ref 24–39)
T4, Total: 6.8 ug/dL (ref 4.5–12.0)
TSH: 1.36 u[IU]/mL (ref 0.450–4.500)

## 2021-10-13 ENCOUNTER — Ambulatory Visit (INDEPENDENT_AMBULATORY_CARE_PROVIDER_SITE_OTHER): Payer: PRIVATE HEALTH INSURANCE | Admitting: Licensed Clinical Social Worker

## 2021-10-13 DIAGNOSIS — F319 Bipolar disorder, unspecified: Secondary | ICD-10-CM

## 2021-10-13 NOTE — BH Specialist Note (Signed)
Integrated Behavioral Health via Telemedicine Visit  10/13/2021 Brandy Ruiz [id number]  Number of Integrated Behavioral Health visits: 2 Session Start time: 200pm  Session End time: 230pm Total time: 30 mins via mychart video   Referring Provider: Meriel Paris CNM Patient/Family location: Home  [facility] Provider location: [facility]  All persons participating in visit: Pt C Kirchgessner and LCSW A Siddiq Kaluzny  Types of Service: Individual psychotherapy and Video visit  I connected with [name] and/or [name]'s n/a via  Telephone or Video Enabled Telemedicine Application  (Video is Caregility application) and verified that I am speaking with the correct person using two identifiers. Discussed confidentiality: Yes   I discussed the limitations of telemedicine and the availability of in person appointments.  Discussed there is a possibility of technology failure and discussed alternative modes of communication if that failure occurs.  I discussed that engaging in this telemedicine visit, they consent to the provision of behavioral healthcare and the services will be billed under their insurance.  Patient and/or legal guardian expressed understanding and consented to Telemedicine visit: Yes   Presenting Concerns: Patient and/or family reports the following symptoms/concerns: bipolar disorder and anxiety Duration of problem: over one year ; Severity of problem: mild  Patient and/or Family's Strengths/Protective Factors: Concrete supports in place (healthy food, safe environments, etc.)  Goals Addressed: Patient will:  Reduce symptoms of: anxiety and bipolar disorder  Increase knowledge and/or ability of: self-management skills   Demonstrate ability to: Increase healthy adjustment to current life circumstances and Improve medication compliance  Progress towards Goals: Achieved  Interventions: Interventions utilized:  Solution-Focused Strategies and Link to [employer] Standardized Assessments completed: Not Needed  Patient and/or Family Response: [name] responded well to mychart appt.   Assessment: Patient currently experiencing bipolar and anxiety. [name] reports she discontinue her mood stabilizer medication prior to her pregnancy and would like to restart her medication. [name] reports she does not have a regular behavioral health provider due to frequent health insurance changes. LCSW A. Felton Clinton advised [name] to go to [facility] located at [address] [place] for [facility] treatment. [name] expressed understanding with this information.    Patient may benefit from outpatient behavioral health.  Plan: Follow up with behavioral health clinician on : as needed  Behavioral recommendations: BH Urgent Care  Referral(s): Community Mental Health Services (LME/Outside Clinic) and Psychological Evaluation/Testing  I discussed the assessment and treatment plan with the patient and/or parent/guardian. They were provided an opportunity to ask questions and all were answered. They agreed with the plan and demonstrated an understanding of the instructions.   They were advised to call back or seek an in-person evaluation if the symptoms worsen or if the condition fails to improve as anticipated.  Gwyndolyn Saxon, LCSW

## 2022-01-07 ENCOUNTER — Ambulatory Visit (HOSPITAL_COMMUNITY): Payer: Self-pay | Admitting: Licensed Clinical Social Worker

## 2022-01-21 ENCOUNTER — Telehealth (HOSPITAL_COMMUNITY): Payer: Self-pay | Admitting: Licensed Clinical Social Worker

## 2022-01-26 ENCOUNTER — Ambulatory Visit (HOSPITAL_COMMUNITY): Payer: PRIVATE HEALTH INSURANCE | Admitting: Licensed Clinical Social Worker

## 2022-01-26 NOTE — Telephone Encounter (Signed)
Encounter opened by mistake

## 2022-05-05 ENCOUNTER — Ambulatory Visit (INDEPENDENT_AMBULATORY_CARE_PROVIDER_SITE_OTHER): Payer: PRIVATE HEALTH INSURANCE

## 2022-05-05 VITALS — Ht 65.0 in | Wt 261.0 lb

## 2022-05-05 DIAGNOSIS — Z3201 Encounter for pregnancy test, result positive: Secondary | ICD-10-CM | POA: Diagnosis not present

## 2022-05-05 LAB — POCT URINE PREGNANCY: Preg Test, Ur: POSITIVE — AB

## 2022-05-05 NOTE — Progress Notes (Addendum)
Ms. Taborn presents today for UPT. She has no unusual complaints.  LMP:03/23/2022    OBJECTIVE: Appears well, in no apparent distress.  OB History     Gravida  3   Para  1   Term  1   Preterm      AB  1   Living  1      SAB  1   IAB      Ectopic      Multiple  0   Live Births  1          Home UPT Result:POSITIVE In-Office UPT result: POSITIVE  I have reviewed the patient's medical, obstetrical, social, and family histories, and medications.   ASSESSMENT: Positive pregnancy test LMP  03/23/2022 EDD  03/29/2023 GA     [redacted]w[redacted]d  PLAN Prenatal care to be completed at: [facility]

## 2022-05-27 ENCOUNTER — Ambulatory Visit: Payer: Medicaid Other | Admitting: *Deleted

## 2022-05-27 ENCOUNTER — Ambulatory Visit (INDEPENDENT_AMBULATORY_CARE_PROVIDER_SITE_OTHER): Payer: Medicaid Other

## 2022-05-27 VITALS — BP 127/78 | HR 101 | Ht 65.0 in | Wt 258.0 lb

## 2022-05-27 DIAGNOSIS — Z3481 Encounter for supervision of other normal pregnancy, first trimester: Secondary | ICD-10-CM

## 2022-05-27 DIAGNOSIS — O0993 Supervision of high risk pregnancy, unspecified, third trimester: Secondary | ICD-10-CM | POA: Insufficient documentation

## 2022-05-27 DIAGNOSIS — Z3480 Encounter for supervision of other normal pregnancy, unspecified trimester: Secondary | ICD-10-CM | POA: Insufficient documentation

## 2022-05-27 MED ORDER — BLOOD PRESSURE KIT DEVI: 1.0000 | 0 refills | Status: DC

## 2022-05-27 MED ORDER — PROMETHAZINE HCL 25 MG PO TABS: 25.0000 mg | ORAL_TABLET | Freq: Four times a day (QID) | ORAL | 1 refills | Status: DC | PRN

## 2022-05-27 NOTE — Progress Notes (Cosign Needed)
New OB Intake  I connected with  Brandy Ruiz on 05/27/22 at 10:15 AM EDT by In Person Visit and verified that I am speaking with the correct person using two identifiers. Nurse is located at [facility] and pt is located at [place].  I discussed the limitations, risks, security and privacy concerns of performing an evaluation and management service by telephone and the availability of in person appointments. I also discussed with the patient that there may be a patient responsible charge related to this service. The patient expressed understanding and agreed to proceed.  I explained I am completing New OB Intake today. We discussed her EDD of 01/06/23 that is based on [place] on 05/27/22. Pt is G3/P1. I reviewed her allergies, medications, Medical/Surgical/OB history, and appropriate screenings. I informed her of [facility] services. [facility] information placed in AVS. Based on history, this is a/an  pregnancy uncomplicated .   Patient Active Problem List   Diagnosis Date Noted   SVD (spontaneous vaginal delivery) 08/23/2021   Chronic hypertension affecting pregnancy 08/20/2021   Rh negative state in antepartum period 07/23/2021   Bipolar 1 disorder ([place]) 02/18/2021   Anxiety during pregnancy 02/18/2021   Tobacco smoking affecting pregnancy 02/18/2021   Encounter for supervision of normal pregnancy in first trimester 02/17/2021    Concerns addressed today  Delivery Plans Plans to deliver at [facility]. Patient given information for [facility] Healthy Baby website for more information about Women's and [place]. Patient is not interested in water birth. Offered upcoming OB visit with CNM to discuss further.  MyChart/Babyscripts MyChart access verified. I explained pt will have some visits in office and some virtually. Babyscripts instructions given and order placed. Patient verifies receipt of registration text/e-mail. Account successfully created and app downloaded.  Blood Pressure Cuff/Weight  Scale Blood pressure cuff ordered for patient to pick-up from [employer]. Explained after first prenatal appt pt will check weekly and document in 56. Patient does / does not  have weight scale. Weight scale ordered for patient to pick up from [employer].   Anatomy US Explained first scheduled [place] will be around 19 weeks. Anatomy US scheduled for 19 at [facility]. Pt notified to arrive at [facility].  Labs Discussed Brandy Ruiz genetic screening with patient. Would like both Panorama and Horizon drawn at new OB visit. Routine prenatal labs needed.  Covid Vaccine Patient has not covid vaccine.   Is patient a CenteringPregnancy candidate?  Declined Declined due to Group Setting Not a candidate due to NA Centering Patient" indicated on sticky note   Social Determinants of Health Food Insecurity: Patient denies food insecurity. WIC Referral: Patient is interested in referral to [facility].  Transportation: Patient denies transportation needs. Childcare: Discussed no children allowed at ultrasound appointments. Offered childcare services; patient declines childcare services at this time.  First visit review I reviewed new OB appt with pt. I explained she will have a provider visit that includes labs, pelvic exam. Explained pt will be seen by Brandy Ruiz, CNM at first visit; encounter routed to appropriate provider. Explained that patient will be seen by pregnancy navigator following visit with provider.   Penny Pia, RN 05/27/2022  10:12 AM

## 2022-07-02 ENCOUNTER — Ambulatory Visit (INDEPENDENT_AMBULATORY_CARE_PROVIDER_SITE_OTHER): Payer: Medicaid Other

## 2022-07-02 ENCOUNTER — Other Ambulatory Visit (HOSPITAL_COMMUNITY)
Admission: RE | Admit: 2022-07-02 | Discharge: 2022-07-02 | Disposition: A | Discharge status: Home / Self Care | Payer: PRIVATE HEALTH INSURANCE | Source: Ambulatory Visit

## 2022-07-02 ENCOUNTER — Institutional Professional Consult (permissible substitution): Payer: Medicaid Other | Admitting: Licensed Clinical Social Worker

## 2022-07-02 VITALS — BP 153/86 | HR 98 | Wt 262.6 lb

## 2022-07-02 DIAGNOSIS — J45909 Unspecified asthma, uncomplicated: Secondary | ICD-10-CM | POA: Insufficient documentation

## 2022-07-02 DIAGNOSIS — Z3481 Encounter for supervision of other normal pregnancy, first trimester: Secondary | ICD-10-CM

## 2022-07-02 DIAGNOSIS — Z141 Cystic fibrosis carrier: Secondary | ICD-10-CM

## 2022-07-02 DIAGNOSIS — O99211 Obesity complicating pregnancy, first trimester: Secondary | ICD-10-CM

## 2022-07-02 DIAGNOSIS — O10919 Unspecified pre-existing hypertension complicating pregnancy, unspecified trimester: Secondary | ICD-10-CM

## 2022-07-02 DIAGNOSIS — Z3A13 13 weeks gestation of pregnancy: Secondary | ICD-10-CM

## 2022-07-02 DIAGNOSIS — D563 Thalassemia minor: Secondary | ICD-10-CM | POA: Insufficient documentation

## 2022-07-02 DIAGNOSIS — Z124 Encounter for screening for malignant neoplasm of cervix: Secondary | ICD-10-CM | POA: Insufficient documentation

## 2022-07-02 DIAGNOSIS — Z148 Genetic carrier of other disease: Secondary | ICD-10-CM

## 2022-07-02 DIAGNOSIS — O26891 Other specified pregnancy related conditions, first trimester: Secondary | ICD-10-CM

## 2022-07-02 DIAGNOSIS — O99511 Diseases of the respiratory system complicating pregnancy, first trimester: Secondary | ICD-10-CM

## 2022-07-02 DIAGNOSIS — Z6791 Unspecified blood type, Rh negative: Secondary | ICD-10-CM

## 2022-07-02 DIAGNOSIS — O10911 Unspecified pre-existing hypertension complicating pregnancy, first trimester: Secondary | ICD-10-CM

## 2022-07-02 MED ORDER — ALBUTEROL SULFATE HFA 108 (90 BASE) MCG/ACT IN AERS: 2.0000 | INHALATION_SPRAY | RESPIRATORY_TRACT | 2 refills | Status: DC | PRN

## 2022-07-02 MED ORDER — ASPIRIN 81 MG PO TBEC: 81.0000 mg | DELAYED_RELEASE_TABLET | Freq: Every day | ORAL | 2 refills | Status: DC

## 2022-07-02 MED ORDER — DOXYLAMINE-PYRIDOXINE 10-10 MG PO TBEC: 2.0000 | DELAYED_RELEASE_TABLET | Freq: Every day | ORAL | 2 refills | Status: DC

## 2022-07-02 NOTE — Progress Notes (Signed)
Patient presents for New OB visit. Patient has no concerns today. Intermittent headaches, no visual changes. Declines genetic screening.

## 2022-07-02 NOTE — Progress Notes (Signed)
Subjective:   Brandy Ruiz is a 31 y.o. G3P1011 at 53w1dby 8 week early ultrasound  on Sept 20, 2023 being seen today for her first obstetrical visit.  Patient states this was an unplanned pregnancy.  Patient reports she was not on birth control prior to conception. She reports having taking oral contraception.   Gynecological/Obstetrical History: Patient reports no history of gynecological surgeries.  Patient denies history of abnormal pap smears.   Pregnancy history fully reviewed. Patient does not intend to breast feed. Patient obstetrical history significant for  CHTN, Rh Negative, SVD .   Sexual Activity and Vaginal Concerns: Patient is currently sexually active and denies pain or discomfort during intercourse.  She also denies vaginal discharge, bleeding, irritation, or odor. Patient also denies pain or difficulty with urination.    Medical History/ROS: Patient reports history of CHTN and asthma.  She denies other medical history significant for cardiovascular, respiratory, gastrointestinal, or hematological disorders. Patient reports history of anxiety, depression, PtSD, and Bipolar Disorder. also denies history of MH disorders including anxiety and/or depression.  Patient reports vomiting.  Patient denies constipation/diarrhea or nausea/vomiting. Patient reports she has been having headaches and contributes it to elevations in her bp.  She reports migraines with some relief with tylenol.    Social History: Patient reports history of tobacco and alcohol use, but discontinued upon discovery of pregnancy. She denies current usage of tobacco, alcohol, or drugs.  Patient reports the FOB is Milon Cheperson who is involved, supportive, and is not present as he is home with the other baby.  Patient reports that she lives with MJoseph Artand daughter and endorses safety at home.  Patient denies DV/A. Patient is currently employed at [employer]  HISTORY: OB History  Gravida Para Term  Preterm AB Living  _0 0 1 1  SAB IAB Ectopic Multiple Live Births  1 0 0 0 1    # Outcome Date GA Lbr Len/2nd Weight Sex Delivery Anes PTL Lv  3 Current           2 Term 08/23/21 353w1d0:31 / 01:24 7 lb 3.2 oz (3.265 kg) F Vag-Spont EPI  LIV     Name: ALMAICEE, ULLMAN   Apgar1: 8  Apgar5: 9  1 SAB 08/2020            Last pap smear was done and is  pending  Past Medical History:  Diagnosis Date   Anxiety    Asthma    Bipolar 1 disorder, manic, mild (HCC)    Depression    Environmental and seasonal allergies    Heart murmur    PTSD (post-traumatic stress disorder)    Past Surgical History:  Procedure Laterality Date   ADENOIDECTOMY     FOOT SURGERY Right 2015   TONSILECTOMY/ADENOIDECTOMY WITH MYRINGOTOMY     Family History  Problem Relation Age of Onset   Celiac disease Mother    Alcoholism Father    Bipolar disorder Brother    Depression Brother    Anxiety disorder Brother    Diabetes Maternal Grandmother    Celiac disease Maternal Grandmother    Social History   Tobacco Use   Smoking status: Former    Types: Cigarettes    Quit date: 12/06/2020    Years since quitting: 1.5   Smokeless tobacco: Never  Vaping Use   Vaping Use: Never used  Substance Use Topics   Alcohol use: Yes    Alcohol/week: 1.0 standard  drink of alcohol    Types: 1 Glasses of wine per week    Comment: not since confirmed pregnancy   Drug use: Not Currently    Comment: CBD gummy, not since confirmed pregnancy   Allergies  Allergen Reactions   Shellfish Allergy Anaphylaxis   Amoxicillin Hives   Other     I'm not supposed to take Beta-blockers.   Penicillins     "I turn pink."   Latex Rash   Current Outpatient Medications on File Prior to Visit  Medication Sig Dispense Refill   acetaminophen (TYLENOL) 500 MG tablet Take 2 tablets (1,000 mg total) by mouth every 8 (eight) hours as needed (pain). 60 tablet 0   albuterol (VENTOLIN HFA) 108 (90 Base) MCG/ACT inhaler Inhale 2  puffs into the lungs every 4 (four) hours as needed for wheezing or shortness of breath. Needs refill     Blood Pressure Monitoring (BLOOD PRESSURE KIT) DEVI 1 Device by Does not apply route once a week. 1 each 0   busPIRone (BUSPAR) 10 MG tablet Take 1 tablet (10 mg total) by mouth 3 (three) times daily as needed. (Patient not taking: Reported on 07/02/2022) 30 tablet 0   EPINEPHrine 0.3 mg/0.3 mL IJ SOAJ injection Inject 0.3 mg into the muscle as needed for anaphylaxis. (Patient not taking: Reported on 10/06/2021)     ibuprofen (ADVIL) 600 MG tablet Take 1 tablet (600 mg total) by mouth every 6 (six) hours as needed (pain). (Patient not taking: Reported on 07/02/2022) 40 tablet 0   metoCLOPramide (REGLAN) 10 MG tablet 10 mg three times daily for 30 days, 10 mg twice daily for 4 days and 10 mg daily for 4 days (Patient not taking: Reported on 07/02/2022) 102 tablet 0   NIFEdipine (ADALAT CC) 30 MG 24 hr tablet Take 1 tablet (30 mg total) by mouth daily. 30 tablet 0   Prenatal Vit-Fe Fumarate-FA (PREPLUS) 27-1 MG TABS Take 1 tablet by mouth daily. 90 tablet 4   promethazine (PHENERGAN) 25 MG tablet Take 1 tablet (25 mg total) by mouth every 6 (six) hours as needed for nausea or vomiting. (Patient not taking: Reported on 07/02/2022) 30 tablet 1   No current facility-administered medications on file prior to visit.    Review of Systems Pertinent items noted in HPI and remainder of comprehensive ROS otherwise negative.  Exam   Vitals:   07/02/22 1310  BP: (!) 153/86  Pulse: 98  Weight: 262 lb 9.6 oz (119.1 kg)   Fetal Heart Rate (bpm): 153  Physical Exam Constitutional:      General: She is not in acute distress.    Appearance: Normal appearance. She is obese. She is not toxic-appearing.  Genitourinary:     Genitourinary Comments: NEFG. Small amt white discharge at introitus.  CV collected Speculum Exam: Moderate amt white discharge. Cervix posterior and not well visualized.  Pap  collected with brush and spatula. No apparent bleeding. BME: Difficult to assess d/t body habitus.   HENT:     Head: Normocephalic and atraumatic.  Eyes:     Conjunctiva/sclera: Conjunctivae normal.  Cardiovascular:     Rate and Rhythm: Normal rate.     Heart sounds: Normal heart sounds.  Pulmonary:     Effort: Pulmonary effort is normal. No respiratory distress.     Breath sounds: Normal breath sounds.  Abdominal:     General: Bowel sounds are normal.     Tenderness: There is no abdominal tenderness.  Musculoskeletal:  General: Normal range of motion.     Cervical back: Normal range of motion.  Neurological:     Mental Status: She is alert and oriented to person, place, and time.  Skin:    General: Skin is warm and dry.  Psychiatric:        Mood and Affect: Mood normal.        Behavior: Behavior normal.  Vitals reviewed. Exam conducted with a chaperone present.     Assessment:   31 y.o. year old G3P1011 Patient Active Problem List   Diagnosis Date Noted   Carrier of spinal muscular atrophy 07/02/2022   Thalassemia alpha carrier 07/02/2022   Cystic fibrosis carrier 07/02/2022   Supervision of other normal pregnancy, antepartum 05/27/2022   Chronic hypertension affecting pregnancy 08/20/2021   Rh negative state in antepartum period 07/23/2021   Bipolar 1 disorder ([place]) 02/18/2021   Anxiety during pregnancy 02/18/2021   Tobacco smoking affecting pregnancy 02/18/2021     Plan:  1. Encounter for supervision of other normal pregnancy in first trimester -Congratulations given and patient welcomed back to practice. -Discussed usage of Babyscripts.   *Instructed to take blood pressure and record weekly into babyscripts. *Reviewed prenatal visit schedule and platforms used for virtual visits.  -Anticipatory guidance for prenatal visits including labs, ultrasounds, and testing; Initial labs completed. -Genetic Screening discussed, First trimester screen, Quad screen,  and NIPS: declined. -Encouraged to complete and utilize MyChart Registration for her ability to review results, send requests, and have questions addressed.  -Discussed estimated due date of Jan 06, 2023. -Ultrasound discussed; fetal anatomic survey: ordered.  -Influenza offered and accepts. -Encouraged to seek out care at office or emergency room for urgent and/or emergent concerns. -No questions or concerns.   2. Chronic hypertension affecting pregnancy -BP elevated. -Discussed and patient agreeable to initiation of Procardia 48m daily. -BP cuff given for home monitoring.  Given information sheet for parameters.  -BL PreE labs ordered  3. Asthma affecting pregnancy in first trimester -Rx for Albuterol sent to pharmacy on file.   4. Rh negative state in antepartum period -Rhogam at 28 weeks and as appropriate for bleeding.   5. Maternal Morbid obesity in pregnancy, antepartum -Rx for bASA sent to pharmacy on file.  -Plan for 10-20lb weight gain. -Labs as appropriate -Hgb A1c Ordered -Needs to be informed of and placement of Cardio-OB Referral as provider did not discuss as per new protocol.  6. Pap smear for cervical cancer screening -Pap collected today.   7. Carrier of spinal muscular atrophy -Noted from previous testing. Partner not tested.   8. Thalassemia alpha carrier -Noted from previous testing. Partner not tested.   9. Cystic fibrosis carrier -Noted from previous testing. Partner not tested.     Problem list reviewed and updated. Routine obstetric precautions reviewed.  Orders Placed This Encounter  Procedures   Culture, OB Urine   CBC/D/Plt+RPR+Rh+ABO+RubIgG...   Comp Met (CMET)   Protein / creatinine ratio, urine   Hemoglobin A1c    No follow-ups on file.     JMaryann Conners CNM 07/02/2022 1:31 PM

## 2022-07-03 LAB — CERVICOVAGINAL ANCILLARY ONLY
Chlamydia: NEGATIVE
Comment: NEGATIVE
Comment: NEGATIVE
Comment: NORMAL
Neisseria Gonorrhea: NEGATIVE
Trichomonas: NEGATIVE

## 2022-07-04 LAB — URINE CULTURE, OB REFLEX

## 2022-07-04 LAB — CULTURE, OB URINE

## 2022-07-05 LAB — CBC/D/PLT+RPR+RH+ABO+RUBIGG...
Antibody Screen: NEGATIVE
Basophils Absolute: 0 10*3/uL (ref 0.0–0.2)
Basos: 0 %
EOS (ABSOLUTE): 0.3 10*3/uL (ref 0.0–0.4)
Eos: 3 %
HCV Ab: NONREACTIVE
HIV Screen 4th Generation wRfx: NONREACTIVE
Hematocrit: 39 % (ref 34.0–46.6)
Hemoglobin: 12.9 g/dL (ref 11.1–15.9)
Hepatitis B Surface Ag: NEGATIVE
Immature Grans (Abs): 0 10*3/uL (ref 0.0–0.1)
Immature Granulocytes: 0 %
Lymphocytes Absolute: 1.7 10*3/uL (ref 0.7–3.1)
Lymphs: 17 %
MCH: 28.1 pg (ref 26.6–33.0)
MCHC: 33.1 g/dL (ref 31.5–35.7)
MCV: 85 fL (ref 79–97)
Monocytes Absolute: 0.5 10*3/uL (ref 0.1–0.9)
Monocytes: 5 %
Neutrophils Absolute: 7.8 10*3/uL — ABNORMAL HIGH (ref 1.4–7.0)
Neutrophils: 75 %
Platelets: 212 10*3/uL (ref 150–450)
RBC: 4.59 x10E6/uL (ref 3.77–5.28)
RDW: 13.7 % (ref 11.7–15.4)
RPR Ser Ql: NONREACTIVE
Rh Factor: NEGATIVE
Rubella Antibodies, IGG: 8.02 index (ref >0.99)
WBC: 10.3 10*3/uL (ref 3.4–10.8)

## 2022-07-05 LAB — HEMOGLOBIN A1C
Est. average glucose Bld gHb Est-mCnc: 114 mg/dL
Hgb A1c MFr Bld: 5.6 % (ref 4.8–5.6)

## 2022-07-05 LAB — COMPREHENSIVE METABOLIC PANEL
ALT: 11 IU/L (ref 0–32)
AST: 16 IU/L (ref 0–40)
Albumin/Globulin Ratio: 1.6 (ref 1.2–2.2)
Albumin: 4 g/dL (ref 4.0–5.0)
Alkaline Phosphatase: 64 IU/L (ref 44–121)
BUN/Creatinine Ratio: 5 — ABNORMAL LOW (ref 9–23)
BUN: 3 mg/dL — ABNORMAL LOW (ref 6–20)
Bilirubin Total: 0.2 mg/dL (ref 0.0–1.2)
CO2: 22 mmol/L (ref 20–29)
Calcium: 8.8 mg/dL (ref 8.7–10.2)
Chloride: 101 mmol/L (ref 96–106)
Creatinine, Ser: 0.59 mg/dL (ref 0.57–1.00)
Globulin, Total: 2.5 g/dL (ref 1.5–4.5)
Glucose: 84 mg/dL (ref 70–99)
Potassium: 4 mmol/L (ref 3.5–5.2)
Sodium: 137 mmol/L (ref 134–144)
Total Protein: 6.5 g/dL (ref 6.0–8.5)
eGFR: 125 mL/min/{1.73_m2} (ref >59)

## 2022-07-05 LAB — HCV INTERPRETATION

## 2022-07-05 LAB — PROTEIN / CREATININE RATIO, URINE
Creatinine, Urine: 201.6 mg/dL
Protein, Ur: 27.5 mg/dL
Protein/Creat Ratio: 136 mg/g creat (ref 0–200)

## 2022-07-08 DIAGNOSIS — R87610 Atypical squamous cells of undetermined significance on cytologic smear of cervix (ASC-US): Secondary | ICD-10-CM | POA: Insufficient documentation

## 2022-07-08 LAB — CYTOLOGY - PAP
Comment: NEGATIVE
Diagnosis: UNDETERMINED — AB
High risk HPV: NEGATIVE

## 2022-08-05 ENCOUNTER — Ambulatory Visit (INDEPENDENT_AMBULATORY_CARE_PROVIDER_SITE_OTHER): Payer: PRIVATE HEALTH INSURANCE | Admitting: Obstetrics and Gynecology

## 2022-08-05 ENCOUNTER — Encounter: Payer: Self-pay | Admitting: Obstetrics and Gynecology

## 2022-08-05 VITALS — BP 136/81 | HR 81 | Wt 259.6 lb

## 2022-08-05 DIAGNOSIS — Z23 Encounter for immunization: Secondary | ICD-10-CM

## 2022-08-05 DIAGNOSIS — Z3480 Encounter for supervision of other normal pregnancy, unspecified trimester: Secondary | ICD-10-CM

## 2022-08-05 DIAGNOSIS — Z3A18 18 weeks gestation of pregnancy: Secondary | ICD-10-CM

## 2022-08-05 DIAGNOSIS — Z3482 Encounter for supervision of other normal pregnancy, second trimester: Secondary | ICD-10-CM

## 2022-08-05 NOTE — Progress Notes (Signed)
Patient presents for ROB visit. Pt reports having rash on her hand that is new. No other concerns at this time.

## 2022-08-05 NOTE — Progress Notes (Signed)
   LOW-RISK PREGNANCY OFFICE VISIT Patient name: Brandy Ruiz MRN [id number]  Date of birth: 05-04-1991 Chief Complaint:   Routine Prenatal Visit  History of Present Illness:   Brandy Ruiz is a 31 y.o. G57P1011 female at [redacted]w[redacted]d with an Estimated Date of Delivery: 01/06/23 being seen today for ongoing management of a low-risk pregnancy.  Today she reports no complaints. Contractions: Not present. Vag. Bleeding: None.   . denies leaking of fluid. Review of Systems:   Pertinent items are noted in HPI Denies abnormal vaginal discharge w/ itching/odor/irritation, headaches, visual changes, shortness of breath, chest pain, abdominal pain, severe nausea/vomiting, or problems with urination or bowel movements unless otherwise stated above. Pertinent History Reviewed:  Reviewed past medical,surgical, social, obstetrical and family history.  Reviewed problem list, medications and allergies. Physical Assessment:   Vitals:   08/05/22 1112  BP: 136/81  Pulse: 81  Weight: 259 lb 9.6 oz (117.8 kg)  Body mass index is 43.2 kg/m.        Physical Examination:   General appearance: Well appearing, and in no distress  Mental status: Alert, oriented to person, place, and time  Skin: Warm & dry  Cardiovascular: Normal heart rate noted  Respiratory: Normal respiratory effort, no distress  Abdomen: Soft, gravid, nontender  Pelvic: Cervical exam deferred         Extremities: Edema: Trace  Fetal Status: Fetal Heart Rate (bpm): 138 Fundal Height:  (S=D)      No results found for this or any previous visit (from the past 24 hour(s)).  Assessment & Plan:  1) Low-risk pregnancy G3P1011 at [redacted]w[redacted]d with an Estimated Date of Delivery: 01/06/23   2) Supervision of other normal pregnancy, antepartum - Notified of anatomy U/S scheduled for 08/12/2022 - Advised that [facility] representative, Mardella Layman could possibly help her with bill acquired from 1st pregnancy - Permission given by patient to share her  contact info with Mardella Layman - Lindsey's email address given to patient  3) Need for immunization against influenza  - Flu Vaccine QUAD 36+ mos IM (Fluarix, Quad PF)  4) [redacted] weeks gestation of pregnancy    Meds: No orders of the defined types were placed in this encounter.  Labs/procedures today: none  Plan:  Continue routine obstetrical care   Reviewed: Preterm labor symptoms and general obstetric precautions including but not limited to vaginal bleeding, contractions, leaking of fluid and fetal movement were reviewed in detail with the patient.  All questions were answered. Has home bp cuff. Check bp weekly, let us know if >140/90.   Follow-up: Return in about 4 weeks (around 09/02/2022) for Return OB visit.  Orders Placed This Encounter  Procedures   Flu Vaccine QUAD 36+ mos IM (Fluarix, Quad PF)   Raelyn Mora MSN, CNM 08/05/2022 12:58 PM

## 2022-08-12 ENCOUNTER — Ambulatory Visit: Payer: PRIVATE HEALTH INSURANCE | Admitting: *Deleted

## 2022-08-12 ENCOUNTER — Ambulatory Visit: Payer: PRIVATE HEALTH INSURANCE

## 2022-08-12 ENCOUNTER — Ambulatory Visit: Payer: PRIVATE HEALTH INSURANCE | Attending: Obstetrics and Gynecology

## 2022-08-12 ENCOUNTER — Encounter: Payer: Self-pay | Admitting: *Deleted

## 2022-08-12 VITALS — BP 136/67 | HR 87

## 2022-08-12 DIAGNOSIS — Z3480 Encounter for supervision of other normal pregnancy, unspecified trimester: Secondary | ICD-10-CM

## 2022-08-12 DIAGNOSIS — Z6791 Unspecified blood type, Rh negative: Secondary | ICD-10-CM | POA: Diagnosis not present

## 2022-08-12 DIAGNOSIS — O10012 Pre-existing essential hypertension complicating pregnancy, second trimester: Secondary | ICD-10-CM | POA: Insufficient documentation

## 2022-08-12 DIAGNOSIS — Z3A19 19 weeks gestation of pregnancy: Secondary | ICD-10-CM | POA: Insufficient documentation

## 2022-08-12 DIAGNOSIS — Z148 Genetic carrier of other disease: Secondary | ICD-10-CM | POA: Diagnosis not present

## 2022-08-12 DIAGNOSIS — D563 Thalassemia minor: Secondary | ICD-10-CM | POA: Insufficient documentation

## 2022-08-12 DIAGNOSIS — O321XX0 Maternal care for breech presentation, not applicable or unspecified: Secondary | ICD-10-CM | POA: Diagnosis not present

## 2022-08-12 DIAGNOSIS — Z141 Cystic fibrosis carrier: Secondary | ICD-10-CM

## 2022-08-12 DIAGNOSIS — O26892 Other specified pregnancy related conditions, second trimester: Secondary | ICD-10-CM | POA: Insufficient documentation

## 2022-08-12 DIAGNOSIS — O99212 Obesity complicating pregnancy, second trimester: Secondary | ICD-10-CM | POA: Insufficient documentation

## 2022-08-12 DIAGNOSIS — Z363 Encounter for antenatal screening for malformations: Secondary | ICD-10-CM | POA: Insufficient documentation

## 2022-08-12 DIAGNOSIS — O09892 Supervision of other high risk pregnancies, second trimester: Secondary | ICD-10-CM | POA: Insufficient documentation

## 2022-08-13 ENCOUNTER — Other Ambulatory Visit: Payer: Self-pay | Admitting: *Deleted

## 2022-08-13 DIAGNOSIS — O09892 Supervision of other high risk pregnancies, second trimester: Secondary | ICD-10-CM

## 2022-08-13 DIAGNOSIS — O99212 Obesity complicating pregnancy, second trimester: Secondary | ICD-10-CM

## 2022-08-13 DIAGNOSIS — D563 Thalassemia minor: Secondary | ICD-10-CM

## 2022-08-13 DIAGNOSIS — O10912 Unspecified pre-existing hypertension complicating pregnancy, second trimester: Secondary | ICD-10-CM

## 2022-08-13 DIAGNOSIS — Z6841 Body Mass Index (BMI) 40.0 and over, adult: Secondary | ICD-10-CM

## 2022-08-13 DIAGNOSIS — Z148 Genetic carrier of other disease: Secondary | ICD-10-CM

## 2022-08-23 ENCOUNTER — Emergency Department (HOSPITAL_COMMUNITY)
Admission: EM | Admit: 2022-08-23 | Discharge: 2022-08-23 | Disposition: A | Discharge status: Home / Self Care | Payer: PRIVATE HEALTH INSURANCE | Attending: Emergency Medicine | Admitting: Emergency Medicine

## 2022-08-23 ENCOUNTER — Other Ambulatory Visit: Payer: Self-pay

## 2022-08-23 DIAGNOSIS — Z3A20 20 weeks gestation of pregnancy: Secondary | ICD-10-CM | POA: Diagnosis not present

## 2022-08-23 DIAGNOSIS — D72829 Elevated white blood cell count, unspecified: Secondary | ICD-10-CM | POA: Diagnosis not present

## 2022-08-23 DIAGNOSIS — Z7982 Long term (current) use of aspirin: Secondary | ICD-10-CM | POA: Insufficient documentation

## 2022-08-23 DIAGNOSIS — Z20822 Contact with and (suspected) exposure to covid-19: Secondary | ICD-10-CM | POA: Diagnosis not present

## 2022-08-23 DIAGNOSIS — J45901 Unspecified asthma with (acute) exacerbation: Secondary | ICD-10-CM

## 2022-08-23 DIAGNOSIS — J4541 Moderate persistent asthma with (acute) exacerbation: Secondary | ICD-10-CM | POA: Insufficient documentation

## 2022-08-23 DIAGNOSIS — Z91040 Latex allergy status: Secondary | ICD-10-CM | POA: Diagnosis not present

## 2022-08-23 DIAGNOSIS — R062 Wheezing: Secondary | ICD-10-CM | POA: Diagnosis present

## 2022-08-23 DIAGNOSIS — B338 Other specified viral diseases: Secondary | ICD-10-CM | POA: Diagnosis not present

## 2022-08-23 DIAGNOSIS — J210 Acute bronchiolitis due to respiratory syncytial virus: Secondary | ICD-10-CM

## 2022-08-23 DIAGNOSIS — B974 Respiratory syncytial virus as the cause of diseases classified elsewhere: Secondary | ICD-10-CM | POA: Insufficient documentation

## 2022-08-23 DIAGNOSIS — Z7951 Long term (current) use of inhaled steroids: Secondary | ICD-10-CM | POA: Insufficient documentation

## 2022-08-23 LAB — RESP PANEL BY RT-PCR (RSV, FLU A&B, COVID)  RVPGX2
Influenza A by PCR: NEGATIVE
Influenza B by PCR: NEGATIVE
Resp Syncytial Virus by PCR: POSITIVE — AB
SARS Coronavirus 2 by RT PCR: NEGATIVE

## 2022-08-23 LAB — CBC
HCT: 36.7 % (ref 36.0–46.0)
Hemoglobin: 12.8 g/dL (ref 12.0–15.0)
MCH: 29 pg (ref 26.0–34.0)
MCHC: 34.9 g/dL (ref 30.0–36.0)
MCV: 83.2 fL (ref 80.0–100.0)
Platelets: 184 10*3/uL (ref 150–400)
RBC: 4.41 MIL/uL (ref 3.87–5.11)
RDW: 13.6 % (ref 11.5–15.5)
WBC: 15.9 10*3/uL — ABNORMAL HIGH (ref 4.0–10.5)
nRBC: 0 % (ref 0.0–0.2)

## 2022-08-23 LAB — BASIC METABOLIC PANEL
Anion gap: 11 (ref 5–15)
BUN: 5 mg/dL — ABNORMAL LOW (ref 6–20)
CO2: 19 mmol/L — ABNORMAL LOW (ref 22–32)
Calcium: 9 mg/dL (ref 8.9–10.3)
Chloride: 105 mmol/L (ref 98–111)
Creatinine, Ser: 0.58 mg/dL (ref 0.44–1.00)
GFR, Estimated: >60 mL/min (ref >60)
Glucose, Bld: 108 mg/dL — ABNORMAL HIGH (ref 70–99)
Potassium: 3.4 mmol/L — ABNORMAL LOW (ref 3.5–5.1)
Sodium: 135 mmol/L (ref 135–145)

## 2022-08-23 LAB — MAGNESIUM: Magnesium: 1.7 mg/dL (ref 1.7–2.4)

## 2022-08-23 MED ORDER — METHYLPREDNISOLONE SODIUM SUCC 125 MG IJ SOLR
60.0000 mg | INTRAMUSCULAR | Status: AC
  Administered 2022-08-23: 60 mg via INTRAVENOUS
  Filled 2022-08-23: qty 2

## 2022-08-23 MED ORDER — ALBUTEROL SULFATE (2.5 MG/3ML) 0.083% IN NEBU: 2.5000 mg | INHALATION_SOLUTION | Freq: Four times a day (QID) | RESPIRATORY_TRACT | 12 refills | Status: DC | PRN

## 2022-08-23 MED ORDER — IPRATROPIUM-ALBUTEROL 0.5-2.5 (3) MG/3ML IN SOLN
3.0000 mL | Freq: Once | RESPIRATORY_TRACT | Status: AC
  Administered 2022-08-23: 3 mL via RESPIRATORY_TRACT
  Filled 2022-08-23: qty 3

## 2022-08-23 MED ORDER — METHYLPREDNISOLONE SODIUM SUCC 125 MG IJ SOLR: 125.0000 mg | INTRAMUSCULAR | Status: DC

## 2022-08-23 NOTE — Discharge Instructions (Signed)
You may do 2 puffs of albuterol prior to going to bed when you get home if needed.  Hydrate, Tylenol 1000 mg every 6 hours for any body aches.  Follow-up with your primary care doctor.  Return to the ER for any with new or worsening symptoms.  Some of your labs and her COVID/flu test are pending currently.  I will call you if there are any abnormal findings.  Tomorrow morning resume your prednisone.

## 2022-08-23 NOTE — ED Provider Notes (Signed)
[facility] EMERGENCY DEPARTMENT Provider Note   CSN: [id number] Arrival date & time: 08/23/22  0023     History  Chief Complaint  Patient presents with   Asthma    Brandy Ruiz is a 31 y.o. female.   Asthma  Patient is a 31 year old female with history of asthma presented emergency room today with complaints of wheezing.  She is approximately 5 months pregnant.  She denies any abdominal pain nausea vomiting or vaginal bleeding.  She has been monitored by her OB/GYN and has not had complications of her pregnancy.  She is here with wheezing.  She has been coughing and wheezing over the past week or so but has become much worse over the past couple days.  She is currently on oral prednisone and azithromycin for empiric treatment of pneumonia but has not had any x-rays done.  She denies any chest pain Chih states that she feels very tight.  No lightheadedness or dizziness no nausea or vomiting.  No recent surgeries, hospitalization, long travel, hemoptysis, estrogen containing OCP, cancer history.  No unilateral leg swelling.  No history of PE or VTE.       Home Medications Prior to Admission medications   Medication Sig Start Date End Date Taking? Authorizing Provider  albuterol (PROVENTIL) (2.5 MG/3ML) 0.083% nebulizer solution Take 3 mLs (2.5 mg total) by nebulization every 6 (six) hours as needed for wheezing or shortness of breath. 08/23/22  Yes Rocco Kerkhoff, Ova Fresh[place] S, PA  acetaminophen (TYLENOL) 500 MG tablet Take 2 tablets (1,000 mg total) by mouth every 8 (eight) hours as needed (pain). 08/25/21   Genia Del, MD  albuterol (VENTOLIN HFA) 108 (90 Base) MCG/ACT inhaler Inhale 2 puffs into the lungs every 4 (four) hours as needed for wheezing or shortness of breath. Needs refill 07/02/22   Gavin Pound, CNM  aspirin EC 81 MG tablet Take 1 tablet (81 mg total) by mouth daily. 07/02/22   Gavin Pound, CNM  Blood Pressure Monitoring (BLOOD PRESSURE  KIT) DEVI 1 Device by Does not apply route once a week. 05/27/22   Griffin Basil, MD  busPIRone (BUSPAR) 10 MG tablet Take 1 tablet (10 mg total) by mouth 3 (three) times daily as needed. Patient not taking: Reported on 07/02/2022 02/18/21   Fatima Blank A, CNM  Doxylamine-Pyridoxine 10-10 MG TBEC Take 2 tablets by mouth at bedtime. 07/02/22   Gavin Pound, CNM  EPINEPHrine 0.3 mg/0.3 mL IJ SOAJ injection Inject 0.3 mg into the muscle as needed for anaphylaxis. Patient not taking: Reported on 10/06/2021 07/25/13   [provider]  ibuprofen (ADVIL) 600 MG tablet Take 1 tablet (600 mg total) by mouth every 6 (six) hours as needed (pain). Patient not taking: Reported on 07/02/2022 08/25/21   Genia Del, MD  metoCLOPramide (REGLAN) 10 MG tablet 10 mg three times daily for 30 days, 10 mg twice daily for 4 days and 10 mg daily for 4 days Patient not taking: Reported on 07/02/2022 10/06/21   Constant, Peggy, MD  Prenatal Vit-Fe Fumarate-FA (PREPLUS) 27-1 MG TABS Take 1 tablet by mouth daily. 04/17/21   Shelly Bombard, MD  promethazine (PHENERGAN) 25 MG tablet Take 1 tablet (25 mg total) by mouth every 6 (six) hours as needed for nausea or vomiting. Patient not taking: Reported on 07/02/2022 05/27/22   Griffin Basil, MD      Allergies    Shellfish allergy, Amoxicillin, Other, Penicillins, and Latex    Review of Systems  Review of Systems  Physical Exam Updated Vital Signs BP 118/75   Pulse (!) 110   Temp 98.4 F (36.9 C)   Resp (!) 22   LMP 03/23/2022 (Exact Date)   SpO2 100%  Physical Exam Vitals and nursing note reviewed.  Constitutional:      Comments: 31 year old female pleasant, able answer questions appropriately commands  HENT:     Head: Normocephalic and atraumatic.     Nose: Nose normal.  Eyes:     General: No scleral icterus. Cardiovascular:     Rate and Rhythm: Normal rate and regular rhythm.     Pulses: Normal pulses.     Heart sounds:  Normal heart sounds.  Pulmonary:     Effort: Pulmonary effort is normal. No respiratory distress.     Breath sounds: Wheezing present.     Comments: Loud expiratory wheezing, mild tachypnea but speaking in full sentences Abdominal:     Palpations: Abdomen is soft.     Tenderness: There is no abdominal tenderness.  Musculoskeletal:     Cervical back: Normal range of motion.     Right lower leg: No edema.     Left lower leg: No edema.  Skin:    General: Skin is warm and dry.     Capillary Refill: Capillary refill takes less than 2 seconds.  Neurological:     Mental Status: She is alert. Mental status is at baseline.  Psychiatric:        Mood and Affect: Mood normal.        Behavior: Behavior normal.     ED Results / Procedures / Treatments   Labs (all labs ordered are listed, but only abnormal results are displayed) Labs Reviewed  RESP PANEL BY RT-PCR (RSV, FLU A&B, COVID)  RVPGX2 - Abnormal; Notable for the following components:      Result Value   Resp Syncytial Virus by PCR POSITIVE (*)    All other components within normal limits  CBC - Abnormal; Notable for the following components:   WBC 15.9 (*)    All other components within normal limits  BASIC METABOLIC PANEL - Abnormal; Notable for the following components:   Potassium 3.4 (*)    CO2 19 (*)    Glucose, Bld 108 (*)    BUN 5 (*)    All other components within normal limits  MAGNESIUM    EKG None  Radiology No results found.  Procedures Procedures    Medications Ordered in ED Medications  ipratropium-albuterol (DUONEB) 0.5-2.5 (3) MG/3ML nebulizer solution 3 mL (3 mLs Nebulization Given 08/23/22 0049)  ipratropium-albuterol (DUONEB) 0.5-2.5 (3) MG/3ML nebulizer solution 3 mL (3 mLs Nebulization Given 08/23/22 0048)  methylPREDNISolone sodium succinate (SOLU-MEDROL) 125 mg/2 mL injection 60 mg (60 mg Intravenous Given 08/23/22 0109)    ED Course/ Medical Decision Making/ A&P Clinical Course as of  08/23/22 8127  [facility] Aug 23, 2022  0046 Discussed with Jeneen Rinks - ED pharm solumed reduced dose [WF]    Clinical Course User Index [WF] Tedd Sias, PA                           Medical Decision Making Amount and/or Complexity of Data Reviewed Labs: ordered.  Risk Prescription drug management.   This patient presents to the ED for concern of SOB, this involves a number of treatment options, and is a complaint that carries with it a high risk of complications and morbidity. A differential  diagnosis was considered for the patient's symptoms which is discussed below:   The causes for shortness of breath include but are not limited to Cardiac (AHF, pericardial effusion and tamponade, arrhythmias, ischemia, etc) Respiratory (COPD, asthma, pneumonia, pneumothorax, primary pulmonary hypertension, PE/VQ mismatch) Hematological (anemia) Neuromuscular (ALS, Guillain-Barr, etc)    Co morbidities: Discussed in HPI   Brief History:  Patient is a 31 year old female with history of asthma presented emergency room today with complaints of wheezing.  She is approximately 5 months pregnant.  She denies any abdominal pain nausea vomiting or vaginal bleeding.  She has been monitored by her OB/GYN and has not had complications of her pregnancy.  She is here with wheezing.  She has been coughing and wheezing over the past week or so but has become much worse over the past couple days.  She is currently on oral prednisone and azithromycin for empiric treatment of pneumonia but has not had any x-rays done.  She denies any chest pain Chih states that she feels very tight.  No lightheadedness or dizziness no nausea or vomiting.  No recent surgeries, hospitalization, long travel, hemoptysis, estrogen containing OCP, cancer history.  No unilateral leg swelling.  No history of PE or VTE.     EMR reviewed including pt PMHx, past surgical history and past visits to ER.   See HPI for more  details   Lab Tests:   I ordered and independently interpreted labs. Labs notable for RSV  Imaging Studies:  No imaging studies ordered for this patient    Cardiac Monitoring:  NA NA   Medicines ordered:  I ordered medication including Solu-Medrol 60 mg dose adjusted after talking to ED pharmacist considering she is pregnant and had 60 mg of prednisone yesterday and 50 mg today Reevaluation of the patient after these medicines showed that the patient improved I have reviewed the patients home medicines and have made adjustments as needed   Critical Interventions:   treatment of asthma exacerbation   Consults/Attending Physician      Reevaluation:  After the interventions noted above I re-evaluated patient and found that they have :improved   Social Determinants of Health:      Problem List / ED Course:  Wheezing, asthma exacerbation secondary to likely viral illness.  She is feeling much improved after treatment in the ER.  Tested positive for RSV.  Leukocytosis is likely secondary to prednisone use.  On reassessment faint barely audible and expiratory wheeze with good air movement.  She states she feels much improved.  Her tachypnea has improved and she is now breathing in a normal rate.  Her heart rate has improved some after the initial tachycardia of the albuterol.  She is requesting discharge home will discharge home at this time.   Dispostion:  After consideration of the diagnostic results and the patients response to treatment, I feel that the patent would benefit from discharge home with nebulizer solution.   Final Clinical Impression(s) / ED Diagnoses Final diagnoses:  Moderate asthma with exacerbation, unspecified whether persistent    Rx / DC Orders ED Discharge Orders          Ordered    albuterol (PROVENTIL) (2.5 MG/3ML) 0.083% nebulizer solution  Every 6 hours PRN        08/23/22 0137              Tedd Sias,  PA 08/23/22 0716    Deno Etienne, DO 08/23/22 0725

## 2022-08-23 NOTE — ED Triage Notes (Signed)
Patient reports worsening asthma with wheezing , chest congestion and productive cough , currently taking oral antibiotic and steroid for URI.

## 2022-08-24 ENCOUNTER — Encounter: Payer: Self-pay | Admitting: Emergency Medicine

## 2022-08-24 ENCOUNTER — Inpatient Hospital Stay (HOSPITAL_COMMUNITY)
Admission: AD | Admit: 2022-08-24 | Discharge: 2022-08-24 | Disposition: A | Discharge status: Home / Self Care | Payer: PRIVATE HEALTH INSURANCE | Attending: Obstetrics & Gynecology | Admitting: Obstetrics & Gynecology

## 2022-08-24 ENCOUNTER — Encounter (HOSPITAL_COMMUNITY): Payer: Self-pay | Admitting: Obstetrics & Gynecology

## 2022-08-24 DIAGNOSIS — O99512 Diseases of the respiratory system complicating pregnancy, second trimester: Secondary | ICD-10-CM | POA: Diagnosis not present

## 2022-08-24 DIAGNOSIS — O10919 Unspecified pre-existing hypertension complicating pregnancy, unspecified trimester: Secondary | ICD-10-CM

## 2022-08-24 DIAGNOSIS — B338 Other specified viral diseases: Secondary | ICD-10-CM | POA: Diagnosis not present

## 2022-08-24 DIAGNOSIS — J45909 Unspecified asthma, uncomplicated: Secondary | ICD-10-CM | POA: Insufficient documentation

## 2022-08-24 DIAGNOSIS — Z3A20 20 weeks gestation of pregnancy: Secondary | ICD-10-CM | POA: Diagnosis not present

## 2022-08-24 DIAGNOSIS — R062 Wheezing: Secondary | ICD-10-CM

## 2022-08-24 MED ORDER — IPRATROPIUM-ALBUTEROL 0.5-2.5 (3) MG/3ML IN SOLN
3.0000 mL | Freq: Once | RESPIRATORY_TRACT | Status: AC
  Administered 2022-08-24: 3 mL via RESPIRATORY_TRACT
  Filled 2022-08-24: qty 3

## 2022-08-24 NOTE — MAU Note (Signed)
.  Brandy Ruiz is a 31 y.o. at [redacted]w[redacted]d here in MAU reporting: she had been seen in the ED on 08/23/22 and was discharged with a diagnosis of asthma. She was called today with positive RSV results and was instructed to follow her discharge paperwork from the previous day. She has been using her albuterol nebulizer treatments at home and has been getting minimal relief. She feels SOB at rest and has a runny nose, wheezing, fatigue. Denies VB, LOF, or pain.   LMP: N/A Onset of complaint: Asthma symptoms have been going on for weeks Pain score: Denies pain Vitals:   08/24/22 1738 08/24/22 1740  BP:  135/69  Pulse:  (!) 103  Resp:  (!) 24  Temp:  98.5 F (36.9 C)  SpO2: 98%      FHT:153 Lab orders placed from triage:  N/A

## 2022-08-24 NOTE — MAU Provider Note (Signed)
History     CSN: [id number]  Arrival date and time: 08/24/22 1725  Event Date/Time  First Provider Initiated Contact with Patient 08/24/22 1904     Chief Complaint  Patient presents with   Shortness of Breath   HPI Brandy Ruiz is a 31 y.o. G3P1011 at 60w5dwho presents to MAU with chief complaint of wheezing 2/2 RSV diagnosis and asthma. Patient was evaluated for wheezing in [facility] yesterday overnight. She states she did not know she was being swabbed for RSV. She learned of her diagnosis this morning. She is mildly upset because she hosted a children's birthday party yesterday. She also states she received discharge instructions for asthma, not for RSV and she is not clear on follow-up care.  Patient also states her employer is upset with her because she doesn't have any documentation confirming her RSV diagnosis.  Patient states she has been sick for about one month. Her [facility]--prescribed a Prednisone taper and Azithromycin for Pneumonia prophylaxis. She was also prescribed a Pulmicort inhaler. Patient states her PCP has been trying to start her on a maintenance inhaler but she is hesitant because she's never needed one before.   Patient states she was diagnosed with a heart murmur at age [age]and throughout her childhood she was told there were specific medication she could never take. She was also advised to take a course of antibiotics prior to having ears pierced, dental procedures.   [facility]   OB History     Gravida  3   Para  1   Term  1   Preterm      AB  1   Living  1      SAB  1   IAB      Ectopic      Multiple  0   Live Births  1           Past Medical History:  Diagnosis Date   Anxiety    Asthma    Bipolar 1 disorder, manic, mild (HCC)    Depression    Environmental and seasonal allergies    Heart murmur    PTSD (post-traumatic stress disorder)     Past Surgical  History:  Procedure Laterality Date   ADENOIDECTOMY     FOOT SURGERY Right 2015   TONSILECTOMY/ADENOIDECTOMY WITH MYRINGOTOMY      Family History  Problem Relation Age of Onset   Celiac disease Mother    Alcoholism Father    Heart Problems Father    Bipolar disorder Brother    Depression Brother    Anxiety disorder Brother    Diabetes Maternal Grandmother    Celiac disease Maternal Grandmother     Social History   Tobacco Use   Smoking status: Former    Types: Cigarettes    Quit date: 12/06/2020    Years since quitting: 1.7   Smokeless tobacco: Never  Vaping Use   Vaping Use: Never used  Substance Use Topics   Alcohol use: Not Currently    Alcohol/week: 1.0 standard drink of alcohol    Types: 1 Glasses of wine per week    Comment: not since confirmed pregnancy   Drug use: Not Currently    Comment: CBD gummy, not since confirmed pregnancy    Allergies:  Allergies  Allergen Reactions   Shellfish Allergy Anaphylaxis   Amoxicillin Hives   Other     I'm not supposed to take  Beta-blockers.   Penicillins     "I turn pink."   Latex Rash    Medications Prior to Admission  Medication Sig Dispense Refill Last Dose   albuterol (PROVENTIL) (2.5 MG/3ML) 0.083% nebulizer solution Take 3 mLs (2.5 mg total) by nebulization every 6 (six) hours as needed for wheezing or shortness of breath. 75 mL 12 08/24/2022   aspirin EC 81 MG tablet Take 1 tablet (81 mg total) by mouth daily. 60 tablet 2 08/24/2022   predniSONE (DELTASONE) 1 MG tablet Take 1 mg by mouth daily with breakfast.   08/24/2022   Prenatal Vit-Fe Fumarate-FA (PREPLUS) 27-1 MG TABS Take 1 tablet by mouth daily. 90 tablet 4 08/24/2022   acetaminophen (TYLENOL) 500 MG tablet Take 2 tablets (1,000 mg total) by mouth every 8 (eight) hours as needed (pain). 60 tablet 0    albuterol (VENTOLIN HFA) 108 (90 Base) MCG/ACT inhaler Inhale 2 puffs into the lungs every 4 (four) hours as needed for wheezing or shortness of breath.  Needs refill 8 g 2    Blood Pressure Monitoring (BLOOD PRESSURE KIT) DEVI 1 Device by Does not apply route once a week. 1 each 0    busPIRone (BUSPAR) 10 MG tablet Take 1 tablet (10 mg total) by mouth 3 (three) times daily as needed. (Patient not taking: Reported on 07/02/2022) 30 tablet 0    Doxylamine-Pyridoxine 10-10 MG TBEC Take 2 tablets by mouth at bedtime. 60 tablet 2    EPINEPHrine 0.3 mg/0.3 mL IJ SOAJ injection Inject 0.3 mg into the muscle as needed for anaphylaxis. (Patient not taking: Reported on 10/06/2021)      ibuprofen (ADVIL) 600 MG tablet Take 1 tablet (600 mg total) by mouth every 6 (six) hours as needed (pain). (Patient not taking: Reported on 07/02/2022) 40 tablet 0    metoCLOPramide (REGLAN) 10 MG tablet 10 mg three times daily for 30 days, 10 mg twice daily for 4 days and 10 mg daily for 4 days (Patient not taking: Reported on 07/02/2022) 102 tablet 0    promethazine (PHENERGAN) 25 MG tablet Take 1 tablet (25 mg total) by mouth every 6 (six) hours as needed for nausea or vomiting. (Patient not taking: Reported on 07/02/2022) 30 tablet 1     Review of Systems  Constitutional:  Positive for fatigue.  Respiratory:  Positive for shortness of breath and wheezing.   All other systems reviewed and are negative.  Physical Exam   Blood pressure 139/71, pulse 100, temperature 98.5 F (36.9 C), temperature source Oral, resp. rate (!) 24, height 5' 5" (1.651 m), weight 120.7 kg, last menstrual period 03/23/2022, SpO2 100 %, currently breastfeeding.  Physical Exam Vitals and nursing note reviewed.  Constitutional:      General: She is not in acute distress.    Appearance: She is well-developed. She is obese. She is not ill-appearing.  Cardiovascular:     Rate and Rhythm: Normal rate and regular rhythm.  Pulmonary:     Effort: Pulmonary effort is normal.     Breath sounds: Examination of the right-upper field reveals wheezing. Examination of the left-upper field reveals  wheezing. Examination of the right-middle field reveals wheezing. Examination of the left-middle field reveals wheezing. Wheezing present.  Skin:    Capillary Refill: Capillary refill takes less than 2 seconds.  Neurological:     Mental Status: She is alert and oriented to person, place, and time.  Psychiatric:        Mood and Affect: Mood normal.  Behavior: Behavior normal.     MAU Course  Procedures  MDM Orders Placed This Encounter  Procedures   AMB Referral to Cardio Obstetrics   Pulse oximetry, continuous   Discharge patient   Patient Vitals for the past 24 hrs:  BP Temp Temp src Pulse Resp SpO2 Height Weight  08/24/22 1935 131/72 98.8 F (37.1 C) Oral 95 17 99 % -- --  08/24/22 1821 -- -- -- -- -- 100 % -- --  08/24/22 1750 139/71 -- -- 100 -- 97 % -- --  08/24/22 1740 135/69 98.5 F (36.9 C) Oral (!) 103 (!) 24 -- 5' 5" (1.651 m) 120.7 kg  08/24/22 1738 -- -- -- -- -- 98 % -- --   Meds ordered this encounter  Medications   ipratropium-albuterol (DUONEB) 0.5-2.5 (3) MG/3ML nebulizer solution 3 mL   Assessment and Plan  --31 y.o. G3P1011 at [redacted]w[redacted]d --FHT 153 by Doppler --Patient speaking in full sentences without difficulty throughout encounter --VSS stable s/p continuous pulse ox --Condition improved s/p Duoneb --Advised to finish Prednisone taper, Pulmicort as prescribed by PCP --Confirmed patient can use rescue inhaler q 4 hours PRN instead of 6 hours for next 48 hours --Discharge home in stable condition with return precautions  F/U: --Patient encouraged to make contact with PCP to add maintenance inhaler as previously advised --[facility], medication restrictions  SDarlina Rumpf [place] MSN, CNM 08/24/2022, 9:54 PM

## 2022-09-02 ENCOUNTER — Encounter: Payer: Self-pay | Admitting: Obstetrics

## 2022-09-02 ENCOUNTER — Ambulatory Visit (INDEPENDENT_AMBULATORY_CARE_PROVIDER_SITE_OTHER): Payer: Self-pay | Admitting: Obstetrics

## 2022-09-02 VITALS — BP 137/80 | HR 94 | Wt 259.0 lb

## 2022-09-02 DIAGNOSIS — O99212 Obesity complicating pregnancy, second trimester: Secondary | ICD-10-CM

## 2022-09-02 DIAGNOSIS — O0990 Supervision of high risk pregnancy, unspecified, unspecified trimester: Secondary | ICD-10-CM

## 2022-09-02 DIAGNOSIS — Z3A22 22 weeks gestation of pregnancy: Secondary | ICD-10-CM

## 2022-09-02 DIAGNOSIS — O0992 Supervision of high risk pregnancy, unspecified, second trimester: Secondary | ICD-10-CM

## 2022-09-02 DIAGNOSIS — E6689 Other obesity not elsewhere classified: Secondary | ICD-10-CM

## 2022-09-02 DIAGNOSIS — O99210 Obesity complicating pregnancy, unspecified trimester: Secondary | ICD-10-CM

## 2022-09-02 DIAGNOSIS — E668 Other obesity: Secondary | ICD-10-CM

## 2022-09-02 DIAGNOSIS — O132 Gestational [pregnancy-induced] hypertension without significant proteinuria, second trimester: Secondary | ICD-10-CM

## 2022-09-02 MED ORDER — NIFEDIPINE ER OSMOTIC RELEASE 30 MG PO TB24: 30.0000 mg | ORAL_TABLET | Freq: Every day | ORAL | 5 refills | Status: DC

## 2022-09-02 NOTE — Progress Notes (Signed)
Subjective:  Brandy Ruiz is a 31 y.o. G3P1011 at [redacted]w[redacted]d being seen today for ongoing prenatal care.  She is currently monitored for the following issues for this high-risk pregnancy and has Bipolar 1 disorder (HCC); Anxiety during pregnancy; Tobacco smoking affecting pregnancy; Rh negative state in antepartum period; Chronic hypertension affecting pregnancy; Supervision of other normal pregnancy, antepartum; Carrier of spinal muscular atrophy; Thalassemia alpha carrier; Cystic fibrosis carrier; Maternal morbid obesity, antepartum (HCC); Asthma affecting pregnancy in first trimester; and ASCUS of cervix with negative high risk HPV on their problem list.  Patient reports no complaints.  Contractions: Not present. Vag. Bleeding: None.  Movement: Present. Denies leaking of fluid.   The following portions of the patient's history were reviewed and updated as appropriate: allergies, current medications, past family history, past medical history, past social history, past surgical history and problem list. Problem list updated.  Objective:   Vitals:   09/02/22 1103 09/02/22 1126  BP: (!) 161/90 137/80  Pulse: (!) 101 94  Weight: 259 lb (117.5 kg)     Fetal Status: Fetal Heart Rate (bpm): 159   Movement: Present     General:  Alert, oriented and cooperative. Patient is in no acute distress.  Skin: Skin is warm and dry. No rash noted.   Cardiovascular: Normal heart rate noted  Respiratory: Normal respiratory effort, no problems with respiration noted  Abdomen: Soft, gravid, appropriate for gestational age. Pain/Pressure: Absent     Pelvic:  Cervical exam deferred        Extremities: Normal range of motion.  Edema: None  Mental Status: Normal mood and affect. Normal behavior. Normal judgment and thought content.   Urinalysis:      Assessment and Plan:  Pregnancy: G3P1011 at [redacted]w[redacted]d  1. Supervision of high risk pregnancy, antepartum  2. PIH (pregnancy induced hypertension), second  trimester Rx: - NIFEdipine (PROCARDIA XL) 30 MG 24 hr tablet; Take 1 tablet (30 mg total) by mouth daily.  Dispense: 30 tablet; Refill: 5 - Lactate dehydrogenase - Creatinine, serum - CBC - ALT - AST - Protein / creatinine ratio, urine  3. Other obesity affecting pregnancy, antepartum    Preterm labor symptoms and general obstetric precautions including but not limited to vaginal bleeding, contractions, leaking of fluid and fetal movement were reviewed in detail with the patient. Please refer to After Visit Summary for other counseling recommendations  Return in about 4 weeks (around 09/30/2022) for ROB, 2 hour OGTT.   Brock Bad, MD 09/02/2022

## 2022-09-03 ENCOUNTER — Encounter: Payer: Self-pay | Admitting: Obstetrics

## 2022-09-03 LAB — CREATININE, SERUM
Creatinine, Ser: 0.54 mg/dL — ABNORMAL LOW (ref 0.57–1.00)
eGFR: 127 mL/min/{1.73_m2} (ref >59)

## 2022-09-03 LAB — ALT: ALT: 13 IU/L (ref 0–32)

## 2022-09-03 LAB — CBC
Hematocrit: 37.7 % (ref 34.0–46.6)
Hemoglobin: 12.6 g/dL (ref 11.1–15.9)
MCH: 28.6 pg (ref 26.6–33.0)
MCHC: 33.4 g/dL (ref 31.5–35.7)
MCV: 86 fL (ref 79–97)
Platelets: 226 10*3/uL (ref 150–450)
RBC: 4.4 x10E6/uL (ref 3.77–5.28)
RDW: 12.7 % (ref 11.7–15.4)
WBC: 13 10*3/uL — ABNORMAL HIGH (ref 3.4–10.8)

## 2022-09-03 LAB — PROTEIN / CREATININE RATIO, URINE
Creatinine, Urine: 209.2 mg/dL
Protein, Ur: 52.6 mg/dL
Protein/Creat Ratio: 251 mg/g creat — ABNORMAL HIGH (ref 0–200)

## 2022-09-03 LAB — LACTATE DEHYDROGENASE: LDH: 265 IU/L — ABNORMAL HIGH (ref 119–226)

## 2022-09-03 LAB — AST: AST: 17 IU/L (ref 0–40)

## 2022-09-15 ENCOUNTER — Ambulatory Visit: Payer: PRIVATE HEALTH INSURANCE | Attending: Obstetrics and Gynecology

## 2022-09-15 ENCOUNTER — Other Ambulatory Visit: Payer: Self-pay | Admitting: *Deleted

## 2022-09-15 ENCOUNTER — Ambulatory Visit: Payer: PRIVATE HEALTH INSURANCE | Admitting: *Deleted

## 2022-09-15 VITALS — BP 136/71 | HR 97

## 2022-09-15 DIAGNOSIS — R030 Elevated blood-pressure reading, without diagnosis of hypertension: Secondary | ICD-10-CM

## 2022-09-15 DIAGNOSIS — O99891 Other specified diseases and conditions complicating pregnancy: Secondary | ICD-10-CM

## 2022-09-15 DIAGNOSIS — O09892 Supervision of other high risk pregnancies, second trimester: Secondary | ICD-10-CM | POA: Diagnosis not present

## 2022-09-15 DIAGNOSIS — O99210 Obesity complicating pregnancy, unspecified trimester: Secondary | ICD-10-CM

## 2022-09-15 DIAGNOSIS — O99212 Obesity complicating pregnancy, second trimester: Secondary | ICD-10-CM | POA: Diagnosis not present

## 2022-09-15 DIAGNOSIS — Z3480 Encounter for supervision of other normal pregnancy, unspecified trimester: Secondary | ICD-10-CM

## 2022-09-15 DIAGNOSIS — O09292 Supervision of pregnancy with other poor reproductive or obstetric history, second trimester: Secondary | ICD-10-CM

## 2022-09-15 DIAGNOSIS — Z6841 Body Mass Index (BMI) 40.0 and over, adult: Secondary | ICD-10-CM

## 2022-09-15 DIAGNOSIS — Z141 Cystic fibrosis carrier: Secondary | ICD-10-CM

## 2022-09-15 DIAGNOSIS — O3662X0 Maternal care for excessive fetal growth, second trimester, not applicable or unspecified: Secondary | ICD-10-CM

## 2022-09-15 DIAGNOSIS — Z6791 Unspecified blood type, Rh negative: Secondary | ICD-10-CM | POA: Insufficient documentation

## 2022-09-15 DIAGNOSIS — O360120 Maternal care for anti-D [Rh] antibodies, second trimester, not applicable or unspecified: Secondary | ICD-10-CM

## 2022-09-15 DIAGNOSIS — O26892 Other specified pregnancy related conditions, second trimester: Secondary | ICD-10-CM | POA: Diagnosis not present

## 2022-09-15 DIAGNOSIS — E669 Obesity, unspecified: Secondary | ICD-10-CM

## 2022-09-15 DIAGNOSIS — D563 Thalassemia minor: Secondary | ICD-10-CM

## 2022-09-15 DIAGNOSIS — Z148 Genetic carrier of other disease: Secondary | ICD-10-CM | POA: Insufficient documentation

## 2022-09-15 DIAGNOSIS — Z3A23 23 weeks gestation of pregnancy: Secondary | ICD-10-CM | POA: Insufficient documentation

## 2022-09-15 DIAGNOSIS — O10912 Unspecified pre-existing hypertension complicating pregnancy, second trimester: Secondary | ICD-10-CM

## 2022-09-15 DIAGNOSIS — Z363 Encounter for antenatal screening for malformations: Secondary | ICD-10-CM | POA: Diagnosis not present

## 2022-09-15 DIAGNOSIS — O285 Abnormal chromosomal and genetic finding on antenatal screening of mother: Secondary | ICD-10-CM

## 2022-09-30 ENCOUNTER — Encounter: Payer: Self-pay | Admitting: Obstetrics

## 2022-09-30 ENCOUNTER — Other Ambulatory Visit: Payer: PRIVATE HEALTH INSURANCE

## 2022-10-02 ENCOUNTER — Ambulatory Visit (INDEPENDENT_AMBULATORY_CARE_PROVIDER_SITE_OTHER): Payer: PRIVATE HEALTH INSURANCE | Admitting: Obstetrics

## 2022-10-02 ENCOUNTER — Encounter: Payer: Self-pay | Admitting: Obstetrics

## 2022-10-02 ENCOUNTER — Other Ambulatory Visit: Payer: PRIVATE HEALTH INSURANCE

## 2022-10-02 VITALS — BP 129/74 | HR 102

## 2022-10-02 DIAGNOSIS — Z3A23 23 weeks gestation of pregnancy: Secondary | ICD-10-CM

## 2022-10-02 DIAGNOSIS — Z3480 Encounter for supervision of other normal pregnancy, unspecified trimester: Secondary | ICD-10-CM

## 2022-10-02 DIAGNOSIS — O360920 Maternal care for other rhesus isoimmunization, second trimester, not applicable or unspecified: Secondary | ICD-10-CM

## 2022-10-02 MED ORDER — RHO D IMMUNE GLOBULIN 1500 UNIT/2ML IJ SOSY
300.0000 ug | PREFILLED_SYRINGE | Freq: Once | INTRAMUSCULAR | Status: AC
  Administered 2022-10-02: 300 ug via INTRAMUSCULAR

## 2022-10-02 NOTE — Progress Notes (Signed)
Subjective:  Brandy Ruiz is a 32 y.o. G3P1011 at [redacted]w[redacted]d being seen today for ongoing prenatal care.  She is currently monitored for the following issues for this low-risk pregnancy and has Bipolar 1 disorder ([place]); Anxiety during pregnancy; Tobacco smoking affecting pregnancy; Rh negative state in antepartum period; Chronic hypertension affecting pregnancy; Supervision of other normal pregnancy, antepartum; Carrier of spinal muscular atrophy; Thalassemia alpha carrier; Cystic fibrosis carrier; Maternal morbid obesity, antepartum ([place]); Asthma affecting pregnancy in first trimester; and ASCUS of cervix with negative high risk HPV on their problem list.  Patient reports no complaints.  Contractions: Not present. Vag. Bleeding: None.  Movement: Present. Denies leaking of fluid.   The following portions of the patient's history were reviewed and updated as appropriate: allergies, current medications, past family history, past medical history, past social history, past surgical history and problem list. Problem list updated.  Objective:   Vitals:   10/02/22 0848  BP: 129/74  Pulse: (!) 102    Fetal Status: Fetal Heart Rate (bpm): 146   Movement: Present     General:  Alert, oriented and cooperative. Patient is in no acute distress.  Skin: Skin is warm and dry. No rash noted.   Cardiovascular: Normal heart rate noted  Respiratory: Normal respiratory effort, no problems with respiration noted  Abdomen: Soft, gravid, appropriate for gestational age. Pain/Pressure: Absent     Pelvic:  Cervical exam deferred        Extremities: Normal range of motion.  Edema: None  Mental Status: Normal mood and affect. Normal behavior. Normal judgment and thought content.   Urinalysis:      Assessment and Plan:  Pregnancy: G3P1011 at [redacted]w[redacted]d  1. Supervision of other normal pregnancy, antepartum  Rx: - Glucose Tolerance, 2 Hours w/1 Hour - RPR - CBC - HIV antibody (with reflex) - rho (d) immune  globulin (RHIG/RHOPHYLAC) injection 300 mcg  Preterm labor symptoms and general obstetric precautions including but not limited to vaginal bleeding, contractions, leaking of fluid and fetal movement were reviewed in detail with the patient. Please refer to After Visit Summary for other counseling recommendations.   Return in about 2 weeks (around 10/16/2022) for ROB.   Shelly Bombard, MD 10/02/2022

## 2022-10-02 NOTE — Progress Notes (Signed)
ROB/GTT.  Rhogam Injection given in LUOQ, tolerated well.  Administrations This Visit     rho (d) immune globulin (RHIG/RHOPHYLAC) injection 300 mcg     Admin Date 10/02/2022 Action Given Dose 300 mcg Route Intramuscular Administered By Tamela Oddi, RMA

## 2022-10-03 LAB — CBC
Hematocrit: 35.3 % (ref 34.0–46.6)
Hemoglobin: 11.4 g/dL (ref 11.1–15.9)
MCH: 27.8 pg (ref 26.6–33.0)
MCHC: 32.3 g/dL (ref 31.5–35.7)
MCV: 86 fL (ref 79–97)
Platelets: 164 10*3/uL (ref 150–450)
RBC: 4.1 x10E6/uL (ref 3.77–5.28)
RDW: 13.3 % (ref 11.7–15.4)
WBC: 11.3 10*3/uL — ABNORMAL HIGH (ref 3.4–10.8)

## 2022-10-03 LAB — GLUCOSE TOLERANCE, 2 HOURS W/ 1HR
Glucose, 1 hour: 163 mg/dL (ref 70–179)
Glucose, 2 hour: 96 mg/dL (ref 70–152)
Glucose, Fasting: 85 mg/dL (ref 70–91)

## 2022-10-03 LAB — HIV ANTIBODY (ROUTINE TESTING W REFLEX): HIV Screen 4th Generation wRfx: NONREACTIVE

## 2022-10-03 LAB — RPR: RPR Ser Ql: NONREACTIVE

## 2022-10-15 ENCOUNTER — Other Ambulatory Visit: Payer: Self-pay | Admitting: *Deleted

## 2022-10-15 ENCOUNTER — Ambulatory Visit: Payer: PRIVATE HEALTH INSURANCE | Attending: Maternal & Fetal Medicine

## 2022-10-15 ENCOUNTER — Ambulatory Visit: Payer: PRIVATE HEALTH INSURANCE | Admitting: *Deleted

## 2022-10-15 VITALS — BP 126/69 | HR 95

## 2022-10-15 DIAGNOSIS — O99891 Other specified diseases and conditions complicating pregnancy: Secondary | ICD-10-CM

## 2022-10-15 DIAGNOSIS — O360130 Maternal care for anti-D [Rh] antibodies, third trimester, not applicable or unspecified: Secondary | ICD-10-CM

## 2022-10-15 DIAGNOSIS — Z148 Genetic carrier of other disease: Secondary | ICD-10-CM

## 2022-10-15 DIAGNOSIS — O3663X0 Maternal care for excessive fetal growth, third trimester, not applicable or unspecified: Secondary | ICD-10-CM

## 2022-10-15 DIAGNOSIS — O163 Unspecified maternal hypertension, third trimester: Secondary | ICD-10-CM

## 2022-10-15 DIAGNOSIS — O99210 Obesity complicating pregnancy, unspecified trimester: Secondary | ICD-10-CM | POA: Insufficient documentation

## 2022-10-15 DIAGNOSIS — O09293 Supervision of pregnancy with other poor reproductive or obstetric history, third trimester: Secondary | ICD-10-CM

## 2022-10-15 DIAGNOSIS — D563 Thalassemia minor: Secondary | ICD-10-CM | POA: Diagnosis not present

## 2022-10-15 DIAGNOSIS — O99213 Obesity complicating pregnancy, third trimester: Secondary | ICD-10-CM | POA: Insufficient documentation

## 2022-10-15 DIAGNOSIS — R030 Elevated blood-pressure reading, without diagnosis of hypertension: Secondary | ICD-10-CM

## 2022-10-15 DIAGNOSIS — Z3A28 28 weeks gestation of pregnancy: Secondary | ICD-10-CM

## 2022-10-15 DIAGNOSIS — E669 Obesity, unspecified: Secondary | ICD-10-CM

## 2022-10-15 DIAGNOSIS — Z141 Cystic fibrosis carrier: Secondary | ICD-10-CM

## 2022-10-16 ENCOUNTER — Encounter: Payer: PRIVATE HEALTH INSURANCE | Admitting: Obstetrics

## 2022-10-19 IMAGING — US US OB < 14 WEEKS - US OB TV
1 series · 15 of 28 positions shown · non-contrast
Comparison: None

CLINICAL DATA: Vaginal bleeding in first trimester of pregnancy,
quantitative beta HCG 5708, LMP 05/08/2020

EXAM:
OBSTETRIC <14 WK US AND TRANSVAGINAL OB US
TECHNIQUE: Both transabdominal and transvaginal ultrasound examinations were
performed for complete evaluation of the gestation as well as the
maternal uterus, adnexal regions, and pelvic cul-de-sac.
Transvaginal technique was performed to assess early pregnancy.

[Series 1: us ob < 14 weeks - us ob tv · 15 of 46 slices shown]
[im 1/46]
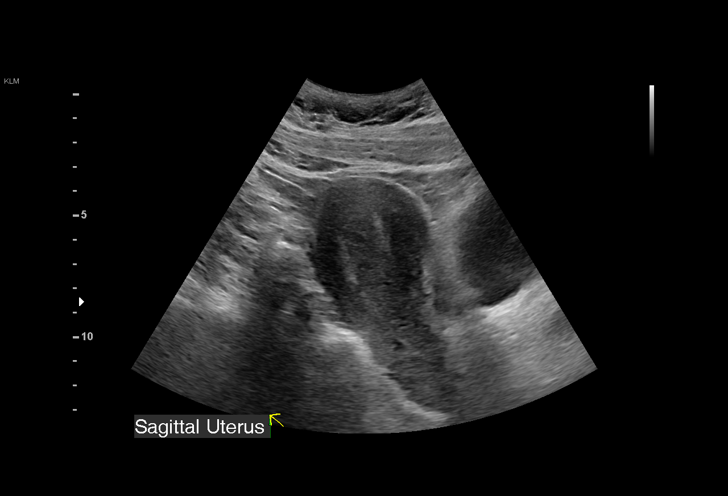
[im 4/46]
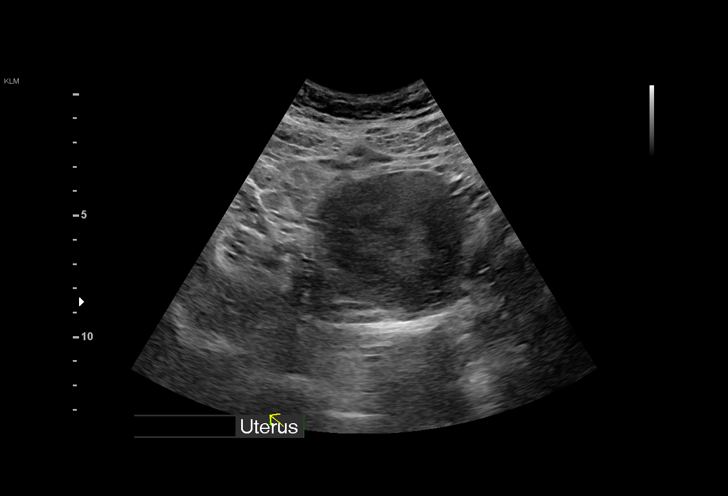
[im 7/46]
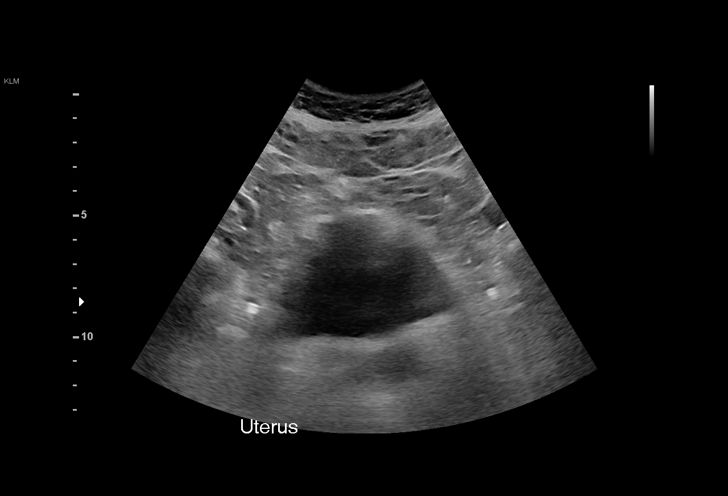
[im 11/46]
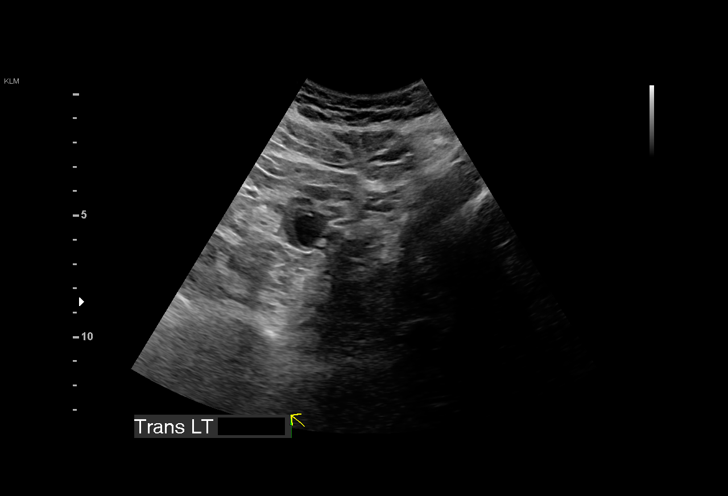
[im 14/46]
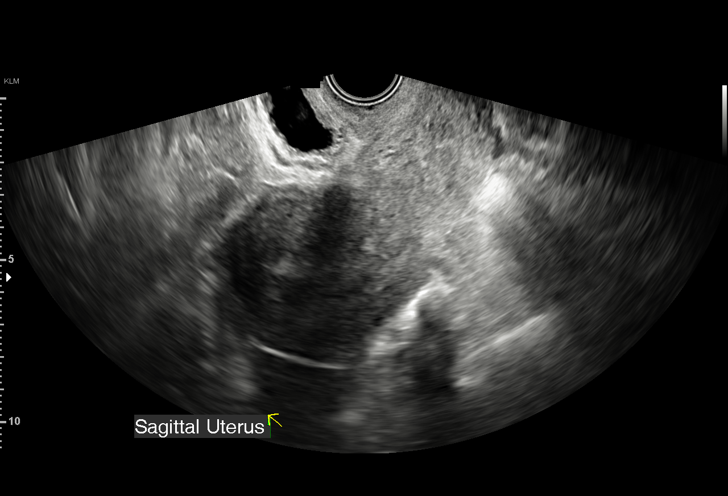
[im 17/46]
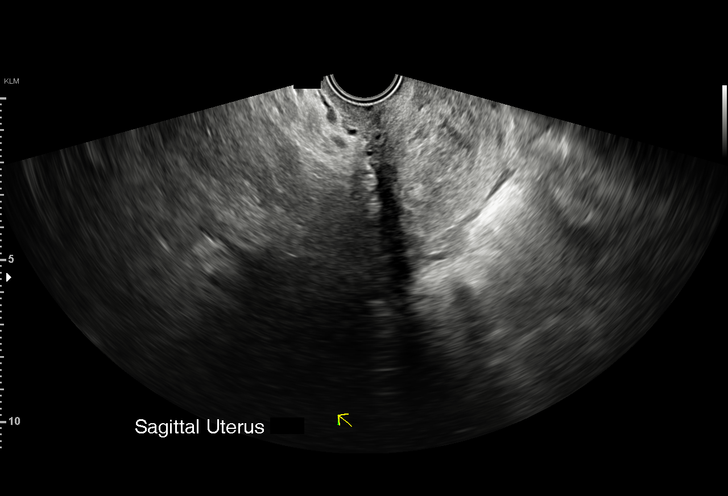
[im 21/46]
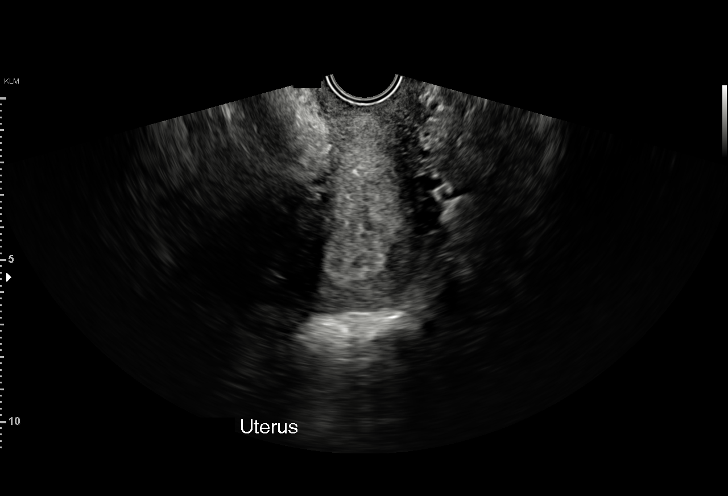
[im 24/46]
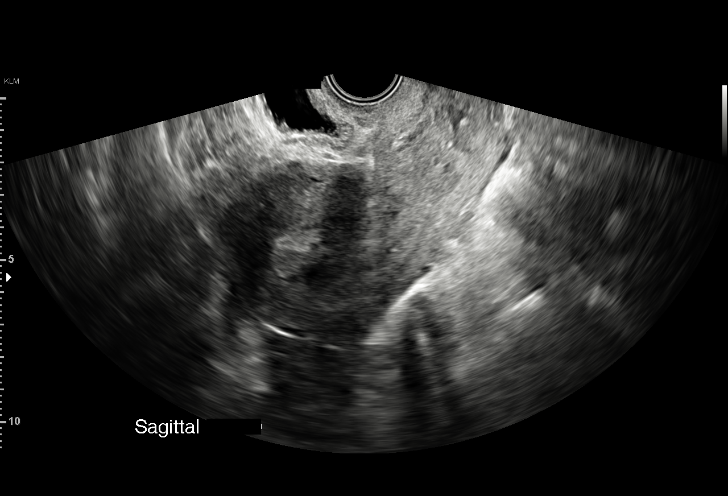
[im 26/46]
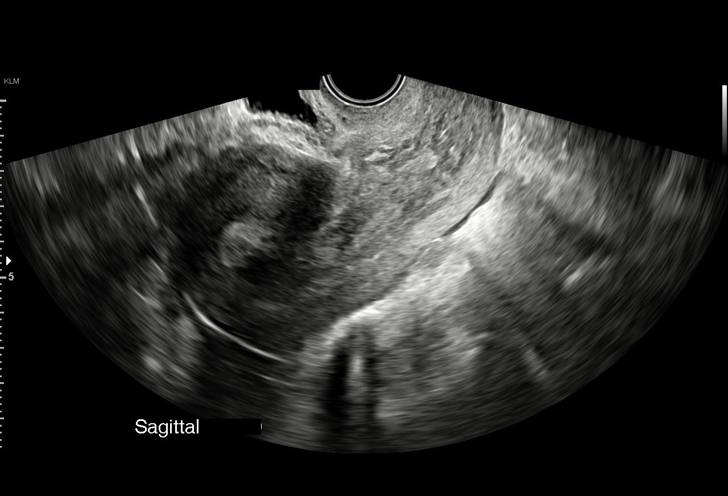
[im 29/46]
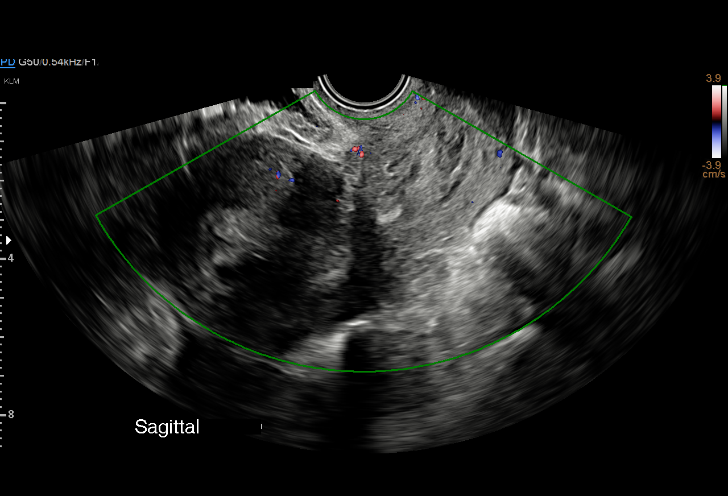
[im 32/46]
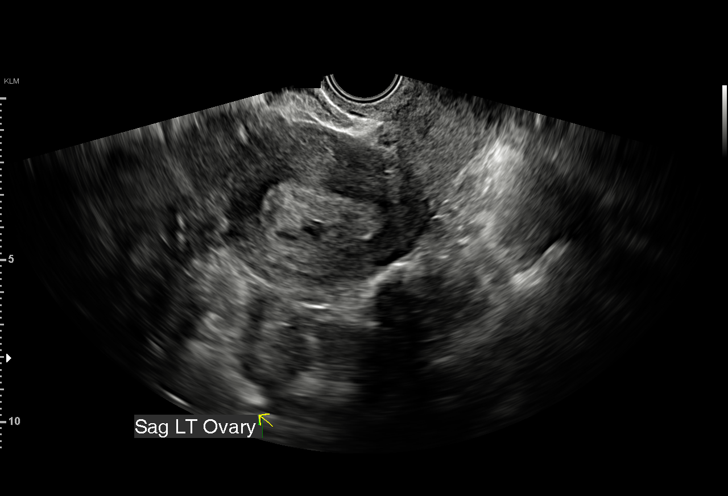
[im 36/46]
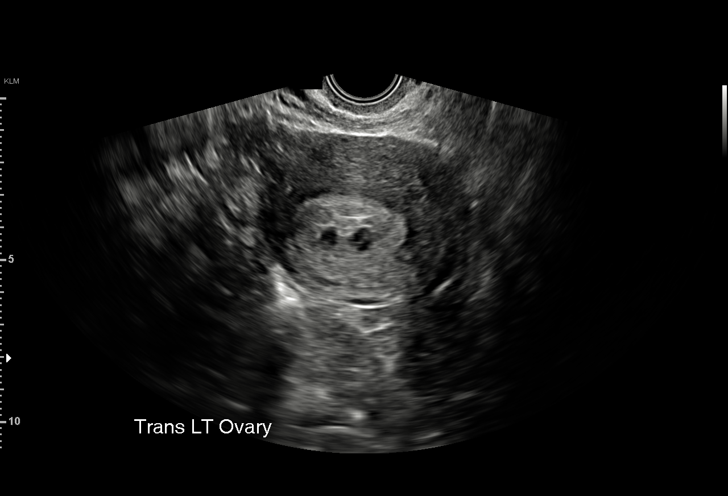
[im 39/46]
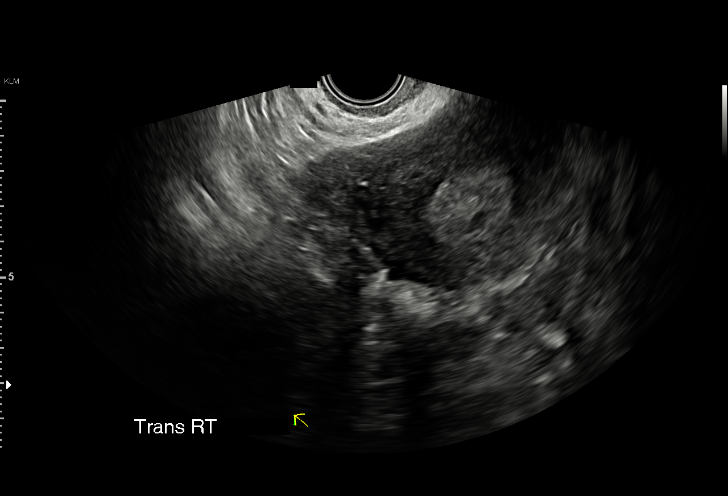
[im 42/46]
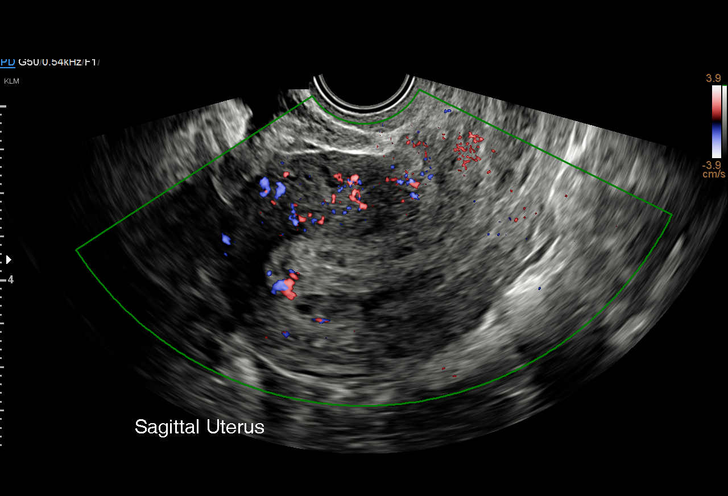
[im 46/46]
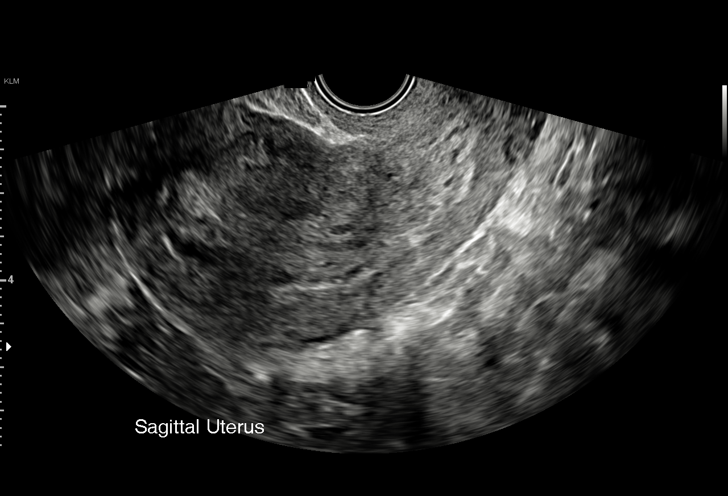

[15 of 28 positions shown; findings below may reference images not displayed]

FINDINGS: Intrauterine gestational sac: Absent

Yolk sac:  N/A

Embryo:  N/A

Cardiac Activity: N/A

Heart Rate: N/A  bpm

MSD:   mm    w     d

CRL:    mm    w    d                  US EDC:

Subchorionic hemorrhage:  N/A

Maternal uterus/adnexae:

Uterus anteverted, with heterogeneous myometrium.

Endometrial complex appears thickened up to 30 mm thick, at the
lower uterine segment appears distended by complex hypoechoic
material, question blood

Minimal endometrial fluid.

No gestational sac or products of conception definitely visualized.

RIGHT ovary obscured by bowel loops in the adnexa.

LEFT ovary unremarkable.

No free pelvic fluid or adnexal masses.
IMPRESSION: Endometrial canal thickened, distended by complex hypoechoic
material question blood.

No intrauterine gestation identified.

Findings are consistent with pregnancy of unknown location.

Differential diagnosis includes early intrauterine pregnancy too
early to visualize, spontaneous abortion, and ectopic pregnancy.

Serial quantitative beta HCG and or follow-up ultrasound recommended
to definitively exclude ectopic pregnancy.

## 2022-10-29 ENCOUNTER — Ambulatory Visit (INDEPENDENT_AMBULATORY_CARE_PROVIDER_SITE_OTHER): Payer: Self-pay | Admitting: Obstetrics and Gynecology

## 2022-10-29 ENCOUNTER — Encounter: Payer: Self-pay | Admitting: Obstetrics and Gynecology

## 2022-10-29 VITALS — BP 133/80 | HR 98 | Wt 265.0 lb

## 2022-10-29 DIAGNOSIS — Z3A30 30 weeks gestation of pregnancy: Secondary | ICD-10-CM

## 2022-10-29 DIAGNOSIS — Z6791 Unspecified blood type, Rh negative: Secondary | ICD-10-CM

## 2022-10-29 DIAGNOSIS — O10913 Unspecified pre-existing hypertension complicating pregnancy, third trimester: Secondary | ICD-10-CM

## 2022-10-29 DIAGNOSIS — Z141 Cystic fibrosis carrier: Secondary | ICD-10-CM

## 2022-10-29 DIAGNOSIS — D563 Thalassemia minor: Secondary | ICD-10-CM

## 2022-10-29 DIAGNOSIS — O26893 Other specified pregnancy related conditions, third trimester: Secondary | ICD-10-CM

## 2022-10-29 DIAGNOSIS — Z3480 Encounter for supervision of other normal pregnancy, unspecified trimester: Secondary | ICD-10-CM

## 2022-10-29 DIAGNOSIS — O10919 Unspecified pre-existing hypertension complicating pregnancy, unspecified trimester: Secondary | ICD-10-CM

## 2022-10-29 NOTE — Progress Notes (Signed)
ROB 30.1 wks States has refused Cardio OB f/u because "this is something I've had my entire life" reports no change. Not taking ASA or Nifediine

## 2022-10-29 NOTE — Progress Notes (Signed)
Subjective:  Brandy Ruiz is a 32 y.o. G3P1011 at 4w1dbeing seen today for ongoing prenatal care.  She is currently monitored for the following issues for this high-risk pregnancy and has Bipolar 1 disorder ([place]; Anxiety during pregnancy; Tobacco smoking affecting pregnancy; Rh negative state in antepartum period; Chronic hypertension affecting pregnancy; Supervision of other normal pregnancy, antepartum; Carrier of spinal muscular atrophy; Thalassemia alpha carrier; Cystic fibrosis carrier; Maternal morbid obesity, antepartum ([place]; Asthma affecting pregnancy in first trimester; and ASCUS of cervix with negative high risk HPV on their problem list.  Patient reports  general discomforts of pregnancy .  Contractions: Not present.  .  Movement: Present. Denies leaking of fluid.   The following portions of the patient's history were reviewed and updated as appropriate: allergies, current medications, past family history, past medical history, past social history, past surgical history and problem list. Problem list updated.  Objective:   Vitals:   10/29/22 0934  BP: 133/80  Pulse: 98  Weight: 265 lb (120.2 kg)    Fetal Status: Fetal Heart Rate (bpm): 138   Movement: Present     General:  Alert, oriented and cooperative. Patient is in no acute distress.  Skin: Skin is warm and dry. No rash noted.   Cardiovascular: Normal heart rate noted  Respiratory: Normal respiratory effort, no problems with respiration noted  Abdomen: Soft, gravid, appropriate for gestational age. Pain/Pressure: Absent     Pelvic:  Cervical exam deferred        Extremities: Normal range of motion.  Edema: None  Mental Status: Normal mood and affect. Normal behavior. Normal judgment and thought content.   Urinalysis:      Assessment and Plan:  Pregnancy: G3P1011 at 370w1d1. Supervision of other normal pregnancy, antepartum Stable Serial growth scans and antenatal testing as per MFM due to BMI  2. Rh  negative state in antepartum period S/P Rho gam  3. Chronic hypertension affecting pregnancy BP stable without meds Pt reports BP is always normal at home, other MD offices. Thus not taking BP meds and BASA  4. Cystic fibrosis carrier Partner testing has been recommended in the past  5. Thalassemia alpha carrier See # 4  Preterm labor symptoms and general obstetric precautions including but not limited to vaginal bleeding, contractions, leaking of fluid and fetal movement were reviewed in detail with the patient. Please refer to After Visit Summary for other counseling recommendations.  Return for OB visit, face to face, MD only.   ErChancy MilroyMD

## 2022-11-10 ENCOUNTER — Inpatient Hospital Stay (HOSPITAL_COMMUNITY)
Admission: AD | Admit: 2022-11-10 | Discharge: 2022-11-10 | Disposition: A | Discharge status: Home / Self Care | Payer: PRIVATE HEALTH INSURANCE | Attending: Obstetrics & Gynecology | Admitting: Obstetrics & Gynecology

## 2022-11-10 ENCOUNTER — Telehealth: Payer: Self-pay

## 2022-11-10 ENCOUNTER — Encounter (HOSPITAL_COMMUNITY): Payer: Self-pay | Admitting: Obstetrics & Gynecology

## 2022-11-10 DIAGNOSIS — O113 Pre-existing hypertension with pre-eclampsia, third trimester: Secondary | ICD-10-CM | POA: Insufficient documentation

## 2022-11-10 DIAGNOSIS — R110 Nausea: Secondary | ICD-10-CM

## 2022-11-10 DIAGNOSIS — O0993 Supervision of high risk pregnancy, unspecified, third trimester: Secondary | ICD-10-CM | POA: Diagnosis not present

## 2022-11-10 DIAGNOSIS — Z141 Cystic fibrosis carrier: Secondary | ICD-10-CM

## 2022-11-10 DIAGNOSIS — G43909 Migraine, unspecified, not intractable, without status migrainosus: Secondary | ICD-10-CM

## 2022-11-10 DIAGNOSIS — Z3A31 31 weeks gestation of pregnancy: Secondary | ICD-10-CM | POA: Diagnosis not present

## 2022-11-10 DIAGNOSIS — O26893 Other specified pregnancy related conditions, third trimester: Secondary | ICD-10-CM | POA: Diagnosis present

## 2022-11-10 DIAGNOSIS — Z3480 Encounter for supervision of other normal pregnancy, unspecified trimester: Secondary | ICD-10-CM

## 2022-11-10 DIAGNOSIS — O99353 Diseases of the nervous system complicating pregnancy, third trimester: Secondary | ICD-10-CM | POA: Diagnosis not present

## 2022-11-10 DIAGNOSIS — Z148 Genetic carrier of other disease: Secondary | ICD-10-CM

## 2022-11-10 DIAGNOSIS — D563 Thalassemia minor: Secondary | ICD-10-CM

## 2022-11-10 LAB — CBC
HCT: 35.4 % — ABNORMAL LOW (ref 36.0–46.0)
Hemoglobin: 11.7 g/dL — ABNORMAL LOW (ref 12.0–15.0)
MCH: 28.1 pg (ref 26.0–34.0)
MCHC: 33.1 g/dL (ref 30.0–36.0)
MCV: 85.1 fL (ref 80.0–100.0)
Platelets: 167 10*3/uL (ref 150–400)
RBC: 4.16 MIL/uL (ref 3.87–5.11)
RDW: 13.9 % (ref 11.5–15.5)
WBC: 12.3 10*3/uL — ABNORMAL HIGH (ref 4.0–10.5)
nRBC: 0 % (ref 0.0–0.2)

## 2022-11-10 LAB — COMPREHENSIVE METABOLIC PANEL
ALT: 13 U/L (ref 0–44)
AST: 17 U/L (ref 15–41)
Albumin: 2.9 g/dL — ABNORMAL LOW (ref 3.5–5.0)
Alkaline Phosphatase: 124 U/L (ref 38–126)
Anion gap: 11 (ref 5–15)
BUN: <5 mg/dL — ABNORMAL LOW (ref 6–20)
CO2: 19 mmol/L — ABNORMAL LOW (ref 22–32)
Calcium: 8.8 mg/dL — ABNORMAL LOW (ref 8.9–10.3)
Chloride: 107 mmol/L (ref 98–111)
Creatinine, Ser: 0.56 mg/dL (ref 0.44–1.00)
GFR, Estimated: >60 mL/min (ref >60)
Glucose, Bld: 83 mg/dL (ref 70–99)
Potassium: 3.5 mmol/L (ref 3.5–5.1)
Sodium: 137 mmol/L (ref 135–145)
Total Bilirubin: 0.6 mg/dL (ref 0.3–1.2)
Total Protein: 6.4 g/dL — ABNORMAL LOW (ref 6.5–8.1)

## 2022-11-10 LAB — URINALYSIS, ROUTINE W REFLEX MICROSCOPIC
Bilirubin Urine: NEGATIVE
Glucose, UA: NEGATIVE mg/dL
Hgb urine dipstick: NEGATIVE
Ketones, ur: 20 mg/dL — AB
Nitrite: NEGATIVE
Protein, ur: 30 mg/dL — AB
Specific Gravity, Urine: 1.027 (ref 1.005–1.030)
pH: 5 (ref 5.0–8.0)

## 2022-11-10 LAB — PROTEIN / CREATININE RATIO, URINE
Creatinine, Urine: 257 mg/dL
Protein Creatinine Ratio: 0.1 mg/mg{Cre} (ref 0.00–0.15)
Total Protein, Urine: 25 mg/dL

## 2022-11-10 MED ORDER — ONDANSETRON HCL 4 MG/2ML IJ SOLN
4.0000 mg | Freq: Once | INTRAMUSCULAR | Status: AC
  Administered 2022-11-10: 4 mg via INTRAVENOUS
  Filled 2022-11-10: qty 2

## 2022-11-10 MED ORDER — ACETAMINOPHEN-CAFFEINE 500-65 MG PO TABS
2.0000 | ORAL_TABLET | Freq: Once | ORAL | Status: AC
  Administered 2022-11-10: 2 via ORAL
  Filled 2022-11-10: qty 2

## 2022-11-10 MED ORDER — METOCLOPRAMIDE HCL 5 MG/ML IJ SOLN
10.0000 mg | Freq: Once | INTRAMUSCULAR | Status: DC
  Filled 2022-11-10: qty 2

## 2022-11-10 MED ORDER — CYCLOBENZAPRINE HCL 5 MG PO TABS: 5.0000 mg | ORAL_TABLET | Freq: Three times a day (TID) | ORAL | 0 refills | Status: DC | PRN

## 2022-11-10 MED ORDER — ACETAMINOPHEN-CAFFEINE 500-65 MG PO TABS: 2.0000 | ORAL_TABLET | Freq: Four times a day (QID) | ORAL | 0 refills | Status: DC | PRN

## 2022-11-10 MED ORDER — DIPHENHYDRAMINE HCL 50 MG/ML IJ SOLN
25.0000 mg | Freq: Once | INTRAMUSCULAR | Status: DC
  Filled 2022-11-10: qty 1

## 2022-11-10 MED ORDER — LACTATED RINGERS IV BOLUS
1000.0000 mL | Freq: Once | INTRAVENOUS | Status: AC
  Administered 2022-11-10: 1000 mL via INTRAVENOUS

## 2022-11-10 NOTE — MAU Note (Addendum)
Patsie Prochazka is a 32 y.o. at 61w6dhere in MAU reporting: migraine started at 0830, until 0830 this morning.  Called [facility], was told to go to ER< was stuck at work,continues to have migraine off and on.  Was seeing spots good portion of yesterday, stopped around 1300. Onset of complaint: yesterday 1300. Took TYlenol about 3hrs ago, didn't really help.  Hx of hypertension.Pounding on left. Pain score: 7 Vitals:   11/10/22 1637  BP: (!) 140/81  Pulse: (!) 101  Resp: 20  Temp: 99.2 F (37.3 C)  SpO2: 96%     FHT:135 Lab orders placed from triage:  urine collected  Pt trying to call someone to get child

## 2022-11-10 NOTE — MAU Provider Note (Signed)
Chief Complaint:  Headache   Event Date/Time   First Provider Initiated Contact with Patient 11/10/22 1700     HPI: Fania Roose is a 32 y.o. G3P1011 at 50w6dwho presents to maternity admissions reporting headache and blurry vision. She reports onset of headache yesterday at 0800. Describes HA as pounding, L-sided. Endorses blurry spots in her visual field that come and go. No history of migraines. She has had nausea/vomiting since onset of headache. Vomited 3 times today. Unable to keep fluids or food down. Has been taking Tylenol around every 6 hours without improvement in pain. She has felt generally "puffy" this pregnancy but denies acute worsening in edema, denies RUQ pain. Denies vaginal bleeding, leaking of fluid, decreased fetal movement.   Pregnancy Course: High risk pregnancy due to cHTN, not on medication. Pregnancy also complicated by bipolar 1 disorder ([place]; Anxiety during pregnancy; Tobacco smoking affecting pregnancy; Rh negative state in antepartum period; Carrier of spinal muscular atrophy; Thalassemia alpha carrier; Cystic fibrosis carrier; Maternal morbid obesity, antepartum ([place]; Asthma affecting pregnancy in first trimester; and ASCUS of cervix with negative high risk HPV, LGA.  Past Medical History:  Diagnosis Date   Anxiety    Asthma    Bipolar 1 disorder, manic, mild (HCC)    Depression    Environmental and seasonal allergies    Heart murmur    PTSD (post-traumatic stress disorder)    OB History  Gravida Para Term Preterm AB Living  '3 1 1   1 1  '$ SAB IAB Ectopic Multiple Live Births  1     0 1    # Outcome Date GA Lbr Len/2nd Weight Sex Delivery Anes PTL Lv  3 Current           2 Term 08/23/21 369w1d0:31 / 01:24 3265 g F Vag-Spont EPI  LIV  1 SAB 08/2020           Past Surgical History:  Procedure Laterality Date   ADENOIDECTOMY     FOOT SURGERY Right 2015   TONSILECTOMY/ADENOIDECTOMY WITH MYRINGOTOMY     Family History  Problem Relation Age of  Onset   Celiac disease Mother    Alcoholism Father    Heart Problems Father    Bipolar disorder Brother    Depression Brother    Anxiety disorder Brother    Diabetes Maternal Grandmother    Celiac disease Maternal Grandmother    Social History   Tobacco Use   Smoking status: Former    Types: Cigarettes    Quit date: 12/06/2020    Years since quitting: 1.9   Smokeless tobacco: Never  Vaping Use   Vaping Use: Never used  Substance Use Topics   Alcohol use: Not Currently    Alcohol/week: 1.0 standard drink of alcohol    Types: 1 Glasses of wine per week    Comment: not since confirmed pregnancy   Drug use: Not Currently    Comment: CBD gummy, not since confirmed pregnancy   Allergies  Allergen Reactions   Shellfish Allergy Anaphylaxis   Amoxicillin Hives   Other     I'm not supposed to take Beta-blockers.   Penicillins     "I turn pink."   Latex Rash   Medications Prior to Admission  Medication Sig Dispense Refill Last Dose   acetaminophen (TYLENOL) 500 MG tablet Take 2 tablets (1,000 mg total) by mouth every 8 (eight) hours as needed (pain). 60 tablet 0    albuterol (PROVENTIL) (2.5 MG/3ML) 0.083% nebulizer solution  Take 3 mLs (2.5 mg total) by nebulization every 6 (six) hours as needed for wheezing or shortness of breath. 75 mL 12    Blood Pressure Monitoring (BLOOD PRESSURE KIT) DEVI 1 Device by Does not apply route once a week. 1 each 0    EPINEPHrine 0.3 mg/0.3 mL IJ SOAJ injection Inject 0.3 mg into the muscle as needed for anaphylaxis.      Prenatal Vit-Fe Fumarate-FA (PREPLUS) 27-1 MG TABS Take 1 tablet by mouth daily. 90 tablet 4     I have reviewed patient's Past Medical Hx, Surgical Hx, Family Hx, Social Hx, medications and allergies.   ROS:  Pertinent items noted in HPI and remainder of comprehensive ROS otherwise negative.   Physical Exam  Patient Vitals for the past 24 hrs:  BP Temp Temp src Pulse Resp SpO2 Height Weight  11/10/22 2016 132/81 -- -- 85  -- -- -- --  11/10/22 2001 135/68 -- -- 83 -- -- -- --  11/10/22 1946 119/63 -- -- 83 -- -- -- --  11/10/22 1942 121/73 -- -- 90 -- -- -- --  11/10/22 1846 130/76 -- -- 92 -- -- -- --  11/10/22 1831 127/63 -- -- 83 -- -- -- --  11/10/22 1816 129/70 -- -- 90 -- -- -- --  11/10/22 1801 131/78 -- -- 92 -- -- -- --  11/10/22 1720 113/65 -- -- 94 -- 98 % -- --  11/10/22 1637 (!) 140/81 99.2 F (37.3 C) Oral (!) 101 20 96 % '5\' 5"'$  (1.651 m) 120.2 kg    Constitutional: Well-developed, well-nourished female in mild distress.  Cardiovascular: normal rate & rhythm, warm and well-perfused Respiratory: normal effort, no problems with respiration noted GI: Abd soft, non-tender, gravid appropriate for gestational age MS: Extremities nontender with +1 pitting edema of BLE, normal ROM Neurologic: Alert and oriented x 4.  GU: no CVA tenderness Pelvic: deferred     Fetal Tracing: Baseline: 135 Variability: moderate Accelerations: 15x15, present Decelerations: absent Toco: irregular contractions   Labs: Results for orders placed or performed during the hospital encounter of 11/10/22 (from the past 24 hour(s))  Urinalysis, Routine w reflex microscopic -Urine, Clean Catch     Status: Abnormal   Collection Time: 11/10/22  5:28 PM  Result Value Ref Range   Color, Urine YELLOW YELLOW   APPearance CLOUDY (A) CLEAR   Specific Gravity, Urine 1.027 1.005 - 1.030   pH 5.0 5.0 - 8.0   Glucose, UA NEGATIVE NEGATIVE mg/dL   Hgb urine dipstick NEGATIVE NEGATIVE   Bilirubin Urine NEGATIVE NEGATIVE   Ketones, ur 20 (A) NEGATIVE mg/dL   Protein, ur 30 (A) NEGATIVE mg/dL   Nitrite NEGATIVE NEGATIVE   Leukocytes,Ua SMALL (A) NEGATIVE   RBC / HPF 0-5 0 - 5 RBC/hpf   WBC, UA 11-20 0 - 5 WBC/hpf   Bacteria, UA FEW (A) NONE SEEN   Squamous Epithelial / HPF 21-50 0 - 5 /HPF   Mucus PRESENT   Protein / creatinine ratio, urine     Status: None   Collection Time: 11/10/22  5:29 PM  Result Value Ref Range    Creatinine, Urine 257 mg/dL   Total Protein, Urine 25 mg/dL   Protein Creatinine Ratio 0.10 0.00 - 0.15 mg/mg[Cre]  CBC     Status: Abnormal   Collection Time: 11/10/22  5:32 PM  Result Value Ref Range   WBC 12.3 (H) 4.0 - 10.5 K/uL   RBC 4.16 3.87 - 5.11 MIL/uL  Hemoglobin 11.7 (L) 12.0 - 15.0 g/dL   HCT 35.4 (L) 36.0 - 46.0 %   MCV 85.1 80.0 - 100.0 fL   MCH 28.1 26.0 - 34.0 pg   MCHC 33.1 30.0 - 36.0 g/dL   RDW 13.9 11.5 - 15.5 %   Platelets 167 150 - 400 K/uL   nRBC 0.0 0.0 - 0.2 %  Comprehensive metabolic panel     Status: Abnormal   Collection Time: 11/10/22  6:30 PM  Result Value Ref Range   Sodium 137 135 - 145 mmol/L   Potassium 3.5 3.5 - 5.1 mmol/L   Chloride 107 98 - 111 mmol/L   CO2 19 (L) 22 - 32 mmol/L   Glucose, Bld 83 70 - 99 mg/dL   BUN <5 (L) 6 - 20 mg/dL   Creatinine, Ser 0.56 0.44 - 1.00 mg/dL   Calcium 8.8 (L) 8.9 - 10.3 mg/dL   Total Protein 6.4 (L) 6.5 - 8.1 g/dL   Albumin 2.9 (L) 3.5 - 5.0 g/dL   AST 17 15 - 41 U/L   ALT 13 0 - 44 U/L   Alkaline Phosphatase 124 38 - 126 U/L   Total Bilirubin 0.6 0.3 - 1.2 mg/dL   GFR, Estimated >60 >60 mL/min   Anion gap 11 5 - 15    Imaging:  No results found.  MAU Course: Orders Placed This Encounter  Procedures   Urinalysis, Routine w reflex microscopic -Urine, Clean Catch   CBC   Protein / creatinine ratio, urine   Comprehensive metabolic panel   Insert peripheral IV   Discharge patient   Meds ordered this encounter  Medications   lactated ringers bolus 1,000 mL   acetaminophen-caffeine (EXCEDRIN TENSION HEADACHE) 500-65 MG per tablet 2 tablet   DISCONTD: metoCLOPramide (REGLAN) injection 10 mg   DISCONTD: diphenhydrAMINE (BENADRYL) injection 25 mg   ondansetron (ZOFRAN) injection 4 mg   acetaminophen-caffeine (EXCEDRIN TENSION HEADACHE) 500-65 MG TABS per tablet    Sig: Take 2 tablets by mouth every 6 (six) hours as needed.    Dispense:  60 tablet    Refill:  0   cyclobenzaprine (FLEXERIL) 5  MG tablet    Sig: Take 1 tablet (5 mg total) by mouth 3 (three) times daily as needed for muscle spasms.    Dispense:  30 tablet    Refill:  0   1700 - On initial assessment, patient found to have mildly elevated BP 140/81, HR 101, otherwise vitally stable. She endorses migraine-like headache and nausea. +1 pitting edema of BLE on exam. Will obtain routine PreE labs - UPCR, CMP, CBC. Administering Excedrin migraine, zofran for headache and nausea. 1L LR bolus ordered.   1830 - Pt feeling some relief in HA. Fluids have been started. CMP in process of being redrawn due to hemolysis. Discussed with patients that lab results so far have been reassuring. Blood pressures improved.  2030 - Pt now reports resolution of HA. Still reports some nausea which she attributes to heartburn. Discussed that her labs are wnl and reassuring against preE. Blood pressures remain wnl other than initial. Safe to discharge to home.   MDM:  Assessment: 1. Carrier of spinal muscular atrophy   2. Thalassemia alpha carrier   3. Supervision of other normal pregnancy, antepartum   4. Cystic fibrosis carrier   5. Migraine without status migrainosus, not intractable, unspecified migraine type   6. Nausea    Migraine-like HA, resolved following 1g acetaminophen with caffeine. PreE labs negative. Safe  to dc to home with instructions to use excedrin migraine 1g up to four times a day and flexeril as needed.  Plan: Discharge home in stable condition.      Allergies as of 11/10/2022       Reactions   Shellfish Allergy Anaphylaxis   Amoxicillin Hives   Other    I'm not supposed to take Beta-blockers.   Penicillins    "I turn pink."   Latex Rash        Medication List     STOP taking these medications    Acetaminophen Extra Strength 500 MG Tabs       TAKE these medications    acetaminophen-caffeine 500-65 MG Tabs per tablet Commonly known as: EXCEDRIN TENSION HEADACHE Take 2 tablets by mouth every 6  (six) hours as needed.   albuterol (2.5 MG/3ML) 0.083% nebulizer solution Commonly known as: PROVENTIL Take 3 mLs (2.5 mg total) by nebulization every 6 (six) hours as needed for wheezing or shortness of breath.   Blood Pressure Kit Devi 1 Device by Does not apply route once a week.   cyclobenzaprine 5 MG tablet Commonly known as: FLEXERIL Take 1 tablet (5 mg total) by mouth 3 (three) times daily as needed for muscle spasms.   EPINEPHrine 0.3 mg/0.3 mL Soaj injection Commonly known as: EPI-PEN Inject 0.3 mg into the muscle as needed for anaphylaxis.   PrePLUS 27-1 MG Tabs Take 1 tablet by mouth daily.        Darlen Round MD PGY-1

## 2022-11-12 ENCOUNTER — Ambulatory Visit: Payer: PRIVATE HEALTH INSURANCE | Attending: Maternal & Fetal Medicine

## 2022-11-12 ENCOUNTER — Ambulatory Visit (INDEPENDENT_AMBULATORY_CARE_PROVIDER_SITE_OTHER): Payer: Self-pay | Admitting: Obstetrics & Gynecology

## 2022-11-12 ENCOUNTER — Ambulatory Visit: Payer: PRIVATE HEALTH INSURANCE

## 2022-11-12 ENCOUNTER — Encounter: Payer: Self-pay | Admitting: Obstetrics & Gynecology

## 2022-11-12 VITALS — BP 130/73 | HR 102

## 2022-11-12 VITALS — BP 136/84 | HR 98 | Wt 265.0 lb

## 2022-11-12 DIAGNOSIS — O10913 Unspecified pre-existing hypertension complicating pregnancy, third trimester: Secondary | ICD-10-CM

## 2022-11-12 DIAGNOSIS — D563 Thalassemia minor: Secondary | ICD-10-CM

## 2022-11-12 DIAGNOSIS — Z3A32 32 weeks gestation of pregnancy: Secondary | ICD-10-CM

## 2022-11-12 DIAGNOSIS — O99213 Obesity complicating pregnancy, third trimester: Secondary | ICD-10-CM

## 2022-11-12 DIAGNOSIS — E669 Obesity, unspecified: Secondary | ICD-10-CM

## 2022-11-12 DIAGNOSIS — R030 Elevated blood-pressure reading, without diagnosis of hypertension: Secondary | ICD-10-CM | POA: Diagnosis not present

## 2022-11-12 DIAGNOSIS — O0993 Supervision of high risk pregnancy, unspecified, third trimester: Secondary | ICD-10-CM | POA: Insufficient documentation

## 2022-11-12 DIAGNOSIS — O3663X0 Maternal care for excessive fetal growth, third trimester, not applicable or unspecified: Secondary | ICD-10-CM

## 2022-11-12 DIAGNOSIS — Z141 Cystic fibrosis carrier: Secondary | ICD-10-CM

## 2022-11-12 DIAGNOSIS — O09293 Supervision of pregnancy with other poor reproductive or obstetric history, third trimester: Secondary | ICD-10-CM | POA: Diagnosis not present

## 2022-11-12 DIAGNOSIS — O99891 Other specified diseases and conditions complicating pregnancy: Secondary | ICD-10-CM | POA: Diagnosis not present

## 2022-11-12 DIAGNOSIS — Z148 Genetic carrier of other disease: Secondary | ICD-10-CM | POA: Diagnosis present

## 2022-11-12 DIAGNOSIS — O360130 Maternal care for anti-D [Rh] antibodies, third trimester, not applicable or unspecified: Secondary | ICD-10-CM

## 2022-11-12 DIAGNOSIS — O99210 Obesity complicating pregnancy, unspecified trimester: Secondary | ICD-10-CM | POA: Insufficient documentation

## 2022-11-12 DIAGNOSIS — O10919 Unspecified pre-existing hypertension complicating pregnancy, unspecified trimester: Secondary | ICD-10-CM

## 2022-11-12 NOTE — Patient Instructions (Signed)
Return to office for any scheduled appointments. Call the office or go to the MAU at [place] at [facility] if: You begin to have strong, frequent contractions Your water breaks.  Sometimes it is a big gush of fluid, sometimes it is just a trickle that keeps getting your underwear wet or running down your legs You have vaginal bleeding.  It is normal to have a small amount of spotting if your cervix was checked.  You do not feel your baby moving like normal.  If you do not, get something to eat and drink and lay down and focus on feeling your baby move.   If your baby is still not moving like normal, you should call the office or go to MAU. Any other obstetric concerns.

## 2022-11-12 NOTE — Progress Notes (Signed)
Pt presents for ROB. Endorses +FM.  No concerns today.

## 2022-11-12 NOTE — Progress Notes (Signed)
PRENATAL VISIT NOTE  Subjective:  Brandy Ruiz is a 32 y.o. G3P1011 at 61w1dbeing seen today for ongoing prenatal care.  She is currently monitored for the following issues for this high-risk pregnancy and has Bipolar 1 disorder ([place]; Anxiety during pregnancy; Tobacco smoking affecting pregnancy; Rh negative state in antepartum period; Chronic hypertension affecting pregnancy; Supervision of high-risk pregnancy, third trimester; Carrier of spinal muscular atrophy; Thalassemia alpha carrier; Cystic fibrosis carrier; Maternal morbid obesity, antepartum ([place]; Asthma affecting pregnancy, antepartum; and ASCUS of cervix with negative high risk HPV on their problem list.  Patient reports no complaints.  Contractions: Not present. Vag. Bleeding: None.  Movement: Present. Denies leaking of fluid.   The following portions of the patient's history were reviewed and updated as appropriate: allergies, current medications, past family history, past medical history, past social history, past surgical history and problem list.   Objective:   Vitals:   11/12/22 0842  BP: 136/84  Pulse: 98  Weight: 265 lb (120.2 kg)    Fetal Status: Fetal Heart Rate (bpm): 137   Movement: Present     General:  Alert, oriented and cooperative. Patient is in no acute distress.  Skin: Skin is warm and dry. No rash noted.   Cardiovascular: Normal heart rate noted  Respiratory: Normal respiratory effort, no problems with respiration noted  Abdomen: Soft, gravid, appropriate for gestational age.  Pain/Pressure: Absent     Pelvic: Cervical exam deferred        Extremities: Normal range of motion.  Edema: Trace  Mental Status: Normal mood and affect. Normal behavior. Normal judgment and thought content.   Imaging: [place]oreaMFM OB FOLLOW UP  Result Date: 10/15/2022 ----------------------------------------------------------------------  OBSTETRICS REPORT                       (Signed Final 10/15/2022 04:29 pm)  ---------------------------------------------------------------------- Patient Info  ID #:       [id number]                         D.O.B.:  [birth year](31 yrs)  Name:       Brandy Ruiz             Visit Date: 10/15/2022 11:00 am ---------------------------------------------------------------------- Performed By  Attending:        VJohnell ComingsMD         Ref. Address:     [address]  [place]                                                             [place]  Performed By:     Nevin Bloodgood          [facility]                                                             [place] [facility]                                                             [facility]  Referred By:      [facility] ---------------------------------------------------------------------- Orders  #  Description                           Code        Ordered By  1  [place] MFM OB FOLLOW UP                   [id number]    Valeda Malm ----------------------------------------------------------------------  #  Order #                     Accession #                Episode #  1  [id number]                   [id number]                 [id number] ---------------------------------------------------------------------- Indications  Large for gestational age fetus affecting      O68.60X0  management of mother  Elevated blood pressure affecting pregnancy    O13.3  in third trimester  Genetic carrier (SMA, Alpha Thal, Cystic       Z14.8  Fibrosis)  Obesity complicating pregnancy, third          O99.213  trimester ( BMI 42)  Short interval between pregancies, 3rd         O09.893  trimester  [redacted] weeks gestation of pregnancy                Z3A.28  Rh negative state in antepartum                 O36.0190  Encounter for other antenatal screening        Z36.2  follow-up ---------------------------------------------------------------------- Fetal Evaluation  Num Of Fetuses:  1  Fetal Heart Rate(bpm):  143  Cardiac Activity:       Observed  Presentation:           Cephalic  Placenta:               Posterior Fundal  P. Cord Insertion:      Previously visualized  Amniotic Fluid  AFI FV:      Within normal limits  AFI Sum(cm)     %Tile       Largest Pocket(cm)  21.29           87          7.95  RUQ(cm)       RLQ(cm)       LUQ(cm)        LLQ(cm)  7.95          4.28          5.16           3.9 ---------------------------------------------------------------------- Biometry  BPD:      79.1  mm     G. Age:  31w 5d       > 99  %    CI:        75.81   %    70 - 86                                                          FL/HC:      19.5   %    18.8 - 20.6  HC:       288   mm     G. Age:  31w 5d         98  %    HC/AC:      1.07        1.05 - 1.21  AC:      269.7  mm     G. Age:  31w 0d         98  %    FL/BPD:     71.2   %    71 - 87  FL:       56.3  mm     G. Age:  29w 4d         76  %    FL/AC:      20.9   %    20 - 24  LV:        4.1  mm  Est. FW:    1626  gm      3 lb 9 oz   > 99  % ---------------------------------------------------------------------- OB History  Gravidity:    3          SAB:   1  Living:       1 ---------------------------------------------------------------------- Gestational Age  LMP:           29w 3d        Date:  03/23/22                  EDD:   12/28/22  U/S Today:     31w 0d  EDD:   12/17/22  Best:          28w 1d     Det. By:  U/S C R L  (05/27/22)    EDD:   01/06/23 ---------------------------------------------------------------------- Anatomy  Cranium:               Appears normal         LVOT:                   Previously seen  Cavum:                 Previously             Aortic Arch:            Previously seen                          visualized  Ventricles:            Previously seen        Ductal Arch:            Previously seen  Choroid Plexus:        Previously seen        Diaphragm:              Appears normal  Cerebellum:            Previously seen        Stomach:                Appears normal, left                                                                        sided  Posterior Fossa:       Previously seen        Abdomen:                Previously seen  Nuchal Fold:           Not applicable ([id number]    Abdominal Wall:         Previously seen                         wks GA)  Face:                  Orbits and profile     Cord Vessels:           Previously seen                         previously seen  Lips:                  Previously seen        Kidneys:                Appear normal  Palate:                Not well visualized    Bladder:                Appears normal  Thoracic:  Appears normal         Spine:                  Previously seen  Heart:                 Appears normal         Upper Extremities:      Previously seen                         (4CH, axis, and                         situs)  RVOT:                  Previously seen        Lower Extremities:      Previously seen  Other:  VC, 3VV, 3VTV, Hands and feet prev visualized. Nasal bone, lenses,          maxilla, mandible and falx prev visualized. Technically difficult due to          maternal habitus and fetal position. ---------------------------------------------------------------------- Cervix Uterus Adnexa  Cervix  Not visualized (advanced GA >24wks)  Uterus  No abnormality visualized.  Right Ovary  Within normal limits.  Left Ovary  Within normal limits.  Cul De Sac  No free fluid seen.  Adnexa  No abnormality visualized ---------------------------------------------------------------------- Comments  This patient was seen for a follow up growth scan due to  maternal obesity with a BMI of 42.  A large for gestational age  fetus was noted on her prior exam.  She  has screened negative for gestational diabetes in her  current pregnancy.  She was informed that the fetal growth continues to measure  large for her gestational age (greater than 99th percentile).  Normal amniotic fluid is noted today.  Due to maternal obesity, we will start weekly fetal testing at  34 weeks.  She will return in 4 weeks for follow-up growth scan. ----------------------------------------------------------------------                  Johnell Comings, MD Electronically Signed Final Report   10/15/2022 04:29 pm ----------------------------------------------------------------------   Assessment and Plan:  Pregnancy: G3P1011 at 53w1d1. Chronic hypertension affecting pregnancy Stable BP, no meds. Continue monitoring BP  2. Maternal morbid obesity, antepartum (HCC) TWG 10 lbs, doing well.  Already scheduled for serial growth scans and antenatal testing as per MFM.   3. Large for gestational age fetus affecting management of mother, antepartum, third trimester 10/15/22 24w1dFW 1626 gm 3 lb 9 oz/ > 99 %, AC 98%.  Pelvis tested to 3265 g (7 lb 2 oz). Will continue to monitor.  4. [redacted] weeks gestation of pregnancy 5. Supervision of high-risk pregnancy, third trimester No other clinical concerns. Preterm labor symptoms and general obstetric precautions including but not limited to vaginal bleeding, contractions, leaking of fluid and fetal movement were reviewed in detail with the patient. Please refer to After Visit Summary for other counseling recommendations.   Return in about 2 weeks (around 11/26/2022) for OFFICE OB VISIT (MD only).  Future Appointments  Date Time Provider [place]3/03/2023 11:15 AM [facility]3/03/2023 11:30 AM [facility] US[facility]/21/2024 11:15 AM FoInez CatalinaMD [facility] None  11/27/2022 11:15 AM [facility] NURSE [facility]/22/2024 11:30 AM [facility]3/28/2024  10:45 AM [facility] NURSE [facility]  12/03/2022 11:00  AM [facility] US1 [facility]  12/10/2022  8:55 AM Johnston Ebbs, NP [facility] None  12/10/2022 11:15 AM [facility] NURSE [facility]  12/10/2022 11:30 AM [facility] US[facility]  12/17/2022  9:35 AM Johnston Ebbs, NP [facility] None  12/24/2022  9:35 AM Woodroe Mode, MD [facility] None  12/31/2022  9:35 AM Chancy Milroy, MD [facility] None    Verita Schneiders, MD

## 2022-11-19 ENCOUNTER — Ambulatory Visit: Payer: PRIVATE HEALTH INSURANCE | Attending: Obstetrics

## 2022-11-19 ENCOUNTER — Ambulatory Visit: Payer: PRIVATE HEALTH INSURANCE

## 2022-11-26 ENCOUNTER — Ambulatory Visit (INDEPENDENT_AMBULATORY_CARE_PROVIDER_SITE_OTHER): Payer: Self-pay | Admitting: Obstetrics and Gynecology

## 2022-11-26 ENCOUNTER — Encounter: Payer: Self-pay | Admitting: Obstetrics and Gynecology

## 2022-11-26 VITALS — BP 138/82 | HR 103 | Wt 265.0 lb

## 2022-11-26 DIAGNOSIS — Z6791 Unspecified blood type, Rh negative: Secondary | ICD-10-CM

## 2022-11-26 DIAGNOSIS — O0993 Supervision of high risk pregnancy, unspecified, third trimester: Secondary | ICD-10-CM

## 2022-11-26 DIAGNOSIS — D563 Thalassemia minor: Secondary | ICD-10-CM

## 2022-11-26 DIAGNOSIS — Z141 Cystic fibrosis carrier: Secondary | ICD-10-CM

## 2022-11-26 DIAGNOSIS — O99343 Other mental disorders complicating pregnancy, third trimester: Secondary | ICD-10-CM

## 2022-11-26 DIAGNOSIS — F419 Anxiety disorder, unspecified: Secondary | ICD-10-CM

## 2022-11-26 DIAGNOSIS — Z148 Genetic carrier of other disease: Secondary | ICD-10-CM

## 2022-11-26 DIAGNOSIS — O26893 Other specified pregnancy related conditions, third trimester: Secondary | ICD-10-CM

## 2022-11-26 DIAGNOSIS — O3663X0 Maternal care for excessive fetal growth, third trimester, not applicable or unspecified: Secondary | ICD-10-CM

## 2022-11-26 DIAGNOSIS — O99513 Diseases of the respiratory system complicating pregnancy, third trimester: Secondary | ICD-10-CM

## 2022-11-26 DIAGNOSIS — O10913 Unspecified pre-existing hypertension complicating pregnancy, third trimester: Secondary | ICD-10-CM

## 2022-11-26 DIAGNOSIS — F319 Bipolar disorder, unspecified: Secondary | ICD-10-CM

## 2022-11-26 DIAGNOSIS — J45909 Unspecified asthma, uncomplicated: Secondary | ICD-10-CM

## 2022-11-26 DIAGNOSIS — Z3A34 34 weeks gestation of pregnancy: Secondary | ICD-10-CM

## 2022-11-26 DIAGNOSIS — O10919 Unspecified pre-existing hypertension complicating pregnancy, unspecified trimester: Secondary | ICD-10-CM

## 2022-11-26 DIAGNOSIS — Z6841 Body Mass Index (BMI) 40.0 and over, adult: Secondary | ICD-10-CM

## 2022-11-26 NOTE — Progress Notes (Signed)
PRENATAL VISIT NOTE  Subjective:  Brandy Ruiz is a 32 y.o. G3P1011 at [redacted]w[redacted]d being seen today for ongoing prenatal care.  She is currently monitored for the following issues for this high-risk pregnancy and has Bipolar 1 disorder ([place]); Anxiety during pregnancy; Tobacco smoking affecting pregnancy; Rh negative state in antepartum period; Chronic hypertension affecting pregnancy; Supervision of high-risk pregnancy, third trimester; Carrier of spinal muscular atrophy; Thalassemia alpha carrier; Cystic fibrosis carrier; Maternal morbid obesity, antepartum ([place]); Asthma affecting pregnancy, antepartum; ASCUS of cervix with negative high risk HPV; and Large for gestational age fetus affecting management of mother, antepartum, third trimester on their problem list.  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 Is using rescue inhaler 3x/d and taking benadryl & sudafed. Reports she is doing fine but has a lot of stressors.  Contractions: Not present. Vag. Bleeding: None.  Movement: Present. Denies leaking of fluid.   The following portions of the patient's history were reviewed and updated as appropriate: allergies, current medications, past family history, past medical history, past social history, past surgical history and problem list.   Objective:   Vitals:   11/26/22 1135 11/26/22 1136  BP: (!) 144/81 138/82  Pulse: (!) 103   Weight: 265 lb (120.2 kg)    Fetal Status: Fetal Heart Rate (bpm): 148   Movement: Present     General:  Alert, oriented and cooperative. Patient is in no acute distress.  Skin: Skin is warm and dry. No rash noted.   Cardiovascular: Normal heart rate noted  Respiratory: Normal respiratory effort, no problems with respiration noted  Abdomen: Soft, gravid, appropriate for gestational age.  Pain/Pressure: Absent      Assessment and Plan:  Pregnancy:  G3P1011 at [redacted]w[redacted]d 1. Supervision of high-risk pregnancy, third trimester 2. [redacted] weeks gestation of pregnancy RTC in 1 week due to multiple medical concerns today  3. Chronic hypertension affecting pregnancy Initial BP mild range w/ repeat normotensive. Reports home BP 120-140s/80s, not currently on medications.  Discussed sudafed may be contributing to her elevated BP. Will obtain BP labs and increase home BP monitoring to daily. Asked pt to flag elevated Bps so we can review next week and potentially start antihypertensive medications Antenatal testing per MFM Preeclampsia symptoms discussed & MAU precautions given - CBC - Comp Met (CMET) - Protein / creatinine ratio, urine  4. Asthma affecting pregnancy, antepartum Pt reports having a maintenance/steroid inhaler at home but not sure which one. She will send a MyChart message with pictures so we can determine the best one to start. MAU precautions given  5. Large for gestational age fetus affecting management of mother, antepartum, third trimester Next growth 3/22. Discussed TOD/MOD will depend on result  6. Bipolar 1 disorder ([place]) 7. Anxiety during pregnancy No meds  8. Rh negative state in antepartum period S/p rhogam 1/26  9. Carrier of spinal muscular atrophy 10. Thalassemia alpha carrier 11. Cystic fibrosis carrier  Return in about 1 week (around 12/03/2022) for return OB at 35 weeks.  Future Appointments  Date Time Provider [place]  11/27/2022 11:15 AM [facility] NURSE [facility]  11/27/2022 11:30 AM [facility] US3 [facility]  12/03/2022 10:45 AM [facility] NURSE [facility]  12/03/2022 11:00 AM [facility] US1 [facility]  12/10/2022  8:55 AM Johnston Ebbs, NP [facility] None  12/10/2022 11:15 AM [facility] NURSE [facility]  12/10/2022 11:30 AM [facility] US3 [facility]  [facility]  12/17/2022  9:35 AM Johnston Ebbs, NP [place] None  12/24/2022  9:35 AM Woodroe Mode, MD [facility] None  12/31/2022  9:35 AM Chancy Milroy, MD [place] None    Inez Catalina, MD

## 2022-11-27 ENCOUNTER — Ambulatory Visit: Payer: PRIVATE HEALTH INSURANCE | Attending: Obstetrics

## 2022-11-27 ENCOUNTER — Ambulatory Visit: Payer: PRIVATE HEALTH INSURANCE | Admitting: *Deleted

## 2022-11-27 VITALS — BP 138/72 | HR 97

## 2022-11-27 DIAGNOSIS — Z148 Genetic carrier of other disease: Secondary | ICD-10-CM | POA: Diagnosis present

## 2022-11-27 DIAGNOSIS — O163 Unspecified maternal hypertension, third trimester: Secondary | ICD-10-CM | POA: Diagnosis present

## 2022-11-27 DIAGNOSIS — Z141 Cystic fibrosis carrier: Secondary | ICD-10-CM

## 2022-11-27 DIAGNOSIS — O0993 Supervision of high risk pregnancy, unspecified, third trimester: Secondary | ICD-10-CM | POA: Diagnosis present

## 2022-11-27 DIAGNOSIS — O99213 Obesity complicating pregnancy, third trimester: Secondary | ICD-10-CM | POA: Diagnosis present

## 2022-11-27 DIAGNOSIS — D563 Thalassemia minor: Secondary | ICD-10-CM | POA: Diagnosis present

## 2022-11-27 DIAGNOSIS — Z3A34 34 weeks gestation of pregnancy: Secondary | ICD-10-CM

## 2022-11-27 DIAGNOSIS — O3663X0 Maternal care for excessive fetal growth, third trimester, not applicable or unspecified: Secondary | ICD-10-CM | POA: Insufficient documentation

## 2022-11-27 DIAGNOSIS — O09293 Supervision of pregnancy with other poor reproductive or obstetric history, third trimester: Secondary | ICD-10-CM | POA: Diagnosis not present

## 2022-11-27 DIAGNOSIS — R030 Elevated blood-pressure reading, without diagnosis of hypertension: Secondary | ICD-10-CM | POA: Diagnosis not present

## 2022-11-27 DIAGNOSIS — O99891 Other specified diseases and conditions complicating pregnancy: Secondary | ICD-10-CM | POA: Diagnosis not present

## 2022-11-27 DIAGNOSIS — O360130 Maternal care for anti-D [Rh] antibodies, third trimester, not applicable or unspecified: Secondary | ICD-10-CM

## 2022-11-27 DIAGNOSIS — E669 Obesity, unspecified: Secondary | ICD-10-CM

## 2022-11-27 MED ORDER — BUDESONIDE 180 MCG/ACT IN AEPB: 1.0000 | INHALATION_SPRAY | Freq: Two times a day (BID) | RESPIRATORY_TRACT | 2 refills | Status: DC

## 2022-11-29 LAB — CBC
Hematocrit: 35.9 % (ref 34.0–46.6)
Hemoglobin: 11.7 g/dL (ref 11.1–15.9)
MCH: 27.2 pg (ref 26.6–33.0)
MCHC: 32.6 g/dL (ref 31.5–35.7)
MCV: 84 fL (ref 79–97)
Platelets: 160 10*3/uL (ref 150–450)
RBC: 4.3 x10E6/uL (ref 3.77–5.28)
RDW: 14 % (ref 11.7–15.4)
WBC: 9.3 10*3/uL (ref 3.4–10.8)

## 2022-11-29 LAB — COMPREHENSIVE METABOLIC PANEL
ALT: 9 IU/L (ref 0–32)
AST: 19 IU/L (ref 0–40)
Albumin/Globulin Ratio: 1.4 (ref 1.2–2.2)
Albumin: 3.6 g/dL — ABNORMAL LOW (ref 4.0–5.0)
Alkaline Phosphatase: 176 IU/L — ABNORMAL HIGH (ref 44–121)
BUN/Creatinine Ratio: 8 — ABNORMAL LOW (ref 9–23)
BUN: 4 mg/dL — ABNORMAL LOW (ref 6–20)
Bilirubin Total: 0.3 mg/dL (ref 0.0–1.2)
CO2: 17 mmol/L — ABNORMAL LOW (ref 20–29)
Calcium: 8.5 mg/dL — ABNORMAL LOW (ref 8.7–10.2)
Chloride: 106 mmol/L (ref 96–106)
Creatinine, Ser: 0.49 mg/dL — ABNORMAL LOW (ref 0.57–1.00)
Globulin, Total: 2.6 g/dL (ref 1.5–4.5)
Glucose: 102 mg/dL — ABNORMAL HIGH (ref 70–99)
Potassium: 3.9 mmol/L (ref 3.5–5.2)
Sodium: 138 mmol/L (ref 134–144)
Total Protein: 6.2 g/dL (ref 6.0–8.5)
eGFR: 130 mL/min/{1.73_m2} (ref >59)

## 2022-11-29 LAB — PROTEIN / CREATININE RATIO, URINE
Creatinine, Urine: 170.6 mg/dL
Protein, Ur: 26.8 mg/dL
Protein/Creat Ratio: 157 mg/g creat (ref 0–200)

## 2022-12-03 ENCOUNTER — Ambulatory Visit: Payer: PRIVATE HEALTH INSURANCE

## 2022-12-09 ENCOUNTER — Other Ambulatory Visit: Payer: Self-pay | Admitting: Student

## 2022-12-09 DIAGNOSIS — O10919 Unspecified pre-existing hypertension complicating pregnancy, unspecified trimester: Secondary | ICD-10-CM

## 2022-12-09 DIAGNOSIS — O0993 Supervision of high risk pregnancy, unspecified, third trimester: Secondary | ICD-10-CM

## 2022-12-09 DIAGNOSIS — Z6841 Body Mass Index (BMI) 40.0 and over, adult: Secondary | ICD-10-CM

## 2022-12-09 DIAGNOSIS — Z3A36 36 weeks gestation of pregnancy: Secondary | ICD-10-CM

## 2022-12-09 DIAGNOSIS — F419 Anxiety disorder, unspecified: Secondary | ICD-10-CM

## 2022-12-09 DIAGNOSIS — O3663X0 Maternal care for excessive fetal growth, third trimester, not applicable or unspecified: Secondary | ICD-10-CM

## 2022-12-09 DIAGNOSIS — J45909 Unspecified asthma, uncomplicated: Secondary | ICD-10-CM

## 2022-12-09 DIAGNOSIS — F319 Bipolar disorder, unspecified: Secondary | ICD-10-CM

## 2022-12-10 ENCOUNTER — Ambulatory Visit (INDEPENDENT_AMBULATORY_CARE_PROVIDER_SITE_OTHER): Payer: PRIVATE HEALTH INSURANCE | Admitting: Student

## 2022-12-10 ENCOUNTER — Ambulatory Visit: Payer: PRIVATE HEALTH INSURANCE | Admitting: *Deleted

## 2022-12-10 ENCOUNTER — Other Ambulatory Visit: Payer: Self-pay | Admitting: *Deleted

## 2022-12-10 ENCOUNTER — Ambulatory Visit (HOSPITAL_BASED_OUTPATIENT_CLINIC_OR_DEPARTMENT_OTHER): Payer: PRIVATE HEALTH INSURANCE

## 2022-12-10 ENCOUNTER — Other Ambulatory Visit (HOSPITAL_COMMUNITY)
Admission: RE | Admit: 2022-12-10 | Discharge: 2022-12-10 | Disposition: A | Discharge status: Home / Self Care | Payer: PRIVATE HEALTH INSURANCE | Source: Ambulatory Visit | Attending: Student | Admitting: Student

## 2022-12-10 ENCOUNTER — Encounter: Payer: Self-pay | Admitting: Student

## 2022-12-10 VITALS — BP 137/81 | HR 90 | Wt 266.8 lb

## 2022-12-10 VITALS — BP 134/71 | HR 97

## 2022-12-10 DIAGNOSIS — Z3A36 36 weeks gestation of pregnancy: Secondary | ICD-10-CM | POA: Insufficient documentation

## 2022-12-10 DIAGNOSIS — O09293 Supervision of pregnancy with other poor reproductive or obstetric history, third trimester: Secondary | ICD-10-CM | POA: Diagnosis not present

## 2022-12-10 DIAGNOSIS — R030 Elevated blood-pressure reading, without diagnosis of hypertension: Secondary | ICD-10-CM

## 2022-12-10 DIAGNOSIS — O3663X0 Maternal care for excessive fetal growth, third trimester, not applicable or unspecified: Secondary | ICD-10-CM

## 2022-12-10 DIAGNOSIS — Z6841 Body Mass Index (BMI) 40.0 and over, adult: Secondary | ICD-10-CM

## 2022-12-10 DIAGNOSIS — O99519 Diseases of the respiratory system complicating pregnancy, unspecified trimester: Secondary | ICD-10-CM

## 2022-12-10 DIAGNOSIS — E669 Obesity, unspecified: Secondary | ICD-10-CM

## 2022-12-10 DIAGNOSIS — O163 Unspecified maternal hypertension, third trimester: Secondary | ICD-10-CM

## 2022-12-10 DIAGNOSIS — Z141 Cystic fibrosis carrier: Secondary | ICD-10-CM | POA: Insufficient documentation

## 2022-12-10 DIAGNOSIS — Z148 Genetic carrier of other disease: Secondary | ICD-10-CM | POA: Insufficient documentation

## 2022-12-10 DIAGNOSIS — M549 Dorsalgia, unspecified: Secondary | ICD-10-CM | POA: Diagnosis present

## 2022-12-10 DIAGNOSIS — O99891 Other specified diseases and conditions complicating pregnancy: Secondary | ICD-10-CM

## 2022-12-10 DIAGNOSIS — O0993 Supervision of high risk pregnancy, unspecified, third trimester: Secondary | ICD-10-CM | POA: Insufficient documentation

## 2022-12-10 DIAGNOSIS — D563 Thalassemia minor: Secondary | ICD-10-CM

## 2022-12-10 DIAGNOSIS — O10919 Unspecified pre-existing hypertension complicating pregnancy, unspecified trimester: Secondary | ICD-10-CM

## 2022-12-10 DIAGNOSIS — O99213 Obesity complicating pregnancy, third trimester: Secondary | ICD-10-CM | POA: Insufficient documentation

## 2022-12-10 DIAGNOSIS — J45909 Unspecified asthma, uncomplicated: Secondary | ICD-10-CM

## 2022-12-10 DIAGNOSIS — Z6791 Unspecified blood type, Rh negative: Secondary | ICD-10-CM

## 2022-12-10 LAB — OB RESULTS CONSOLE GBS: GBS: NEGATIVE

## 2022-12-10 NOTE — Progress Notes (Signed)
   PRENATAL VISIT NOTE  Subjective:  Brandy Ruiz is a 32 y.o. G3P1011 at [redacted]w[redacted]d being seen today for ongoing prenatal care.  She is currently monitored for the following issues for this high-risk pregnancy and has Bipolar 1 disorder; Anxiety during pregnancy; Tobacco smoking affecting pregnancy; Rh negative state in antepartum period; Chronic hypertension affecting pregnancy; Supervision of high-risk pregnancy, third trimester; Carrier of spinal muscular atrophy; Thalassemia alpha carrier; Cystic fibrosis carrier; Maternal morbid obesity, antepartum; Asthma affecting pregnancy, antepartum; ASCUS of cervix with negative high risk HPV; and Large for gestational age fetus affecting management of mother, antepartum, third trimester on their problem list.  Patient reports backache.  Contractions: Irritability. Vag. Bleeding: None.  Movement: Present. Denies leaking of fluid.   The following portions of the patient's history were reviewed and updated as appropriate: allergies, current medications, past family history, past medical history, past social history, past surgical history and problem list.   Objective:   Vitals:   12/10/22 0905  BP: 137/81  Pulse: 90  Weight: 266 lb 12.8 oz (121 kg)    Fetal Status: Fetal Heart Rate (bpm): 131   Movement: Present     General:  Alert, oriented and cooperative. Patient is in no acute distress.  Skin: Skin is warm and dry. No rash noted.   Cardiovascular: Normal heart rate noted  Respiratory: Normal respiratory effort, no problems with respiration noted  Abdomen: Soft, gravid, appropriate for gestational age.  Pain/Pressure: Present     Pelvic: Cervical exam deferred        Extremities: Normal range of motion.  Edema: Trace  Mental Status: Normal mood and affect. Normal behavior. Normal judgment and thought content.   Assessment and Plan:  Pregnancy: G3P1011 at [redacted]w[redacted]d 1. Supervision of high-risk pregnancy, third trimester - Doing well, vigorous  fetal movement - Recommended pregnancy support belt for back and pelvic pain with ambulation  2. [redacted] weeks gestation of pregnancy - Swabs collected today  3. BMI 40.0-44.9, adult -Scheduled for follow-up fetal growth assessment.  4. Chronic hypertension affecting pregnancy - Stable, no meds --Scheduled for follow-up fetal growth assessment and BPP  5. Asthma affecting pregnancy, antepartum - Daily and PRN meds  6. Large for gestational age fetus affecting management of mother, antepartum, third trimester -LGA EFW >99% (pelvis tested to 7 lb 2 oz) - Scheduled for follow-up fetal growth assessment and BPP  7. Carrier of spinal muscular atrophy 8. Thalassemia alpha carrier 9. Cystic fibrosis carrier  10. Back pain in pregnancy - Recommended pregnancy belt  - Cervicovaginal ancillary only( [place])   Preterm labor symptoms and general obstetric precautions including but not limited to vaginal bleeding, contractions, leaking of fluid and fetal movement were reviewed in detail with the patient. Please refer to After Visit Summary for other counseling recommendations.   Return in about 1 week (around 12/17/2022) for IN-PERSON, [place].  Future Appointments  Date Time Provider [place]  12/10/2022 11:15 AM [facility] NURSE [facility]  12/10/2022 11:30 AM [facility] US3 [facility]  12/17/2022  9:35 AM Johnston Ebbs, NP [place] None  12/24/2022  9:35 AM Woodroe Mode, MD [facility] None  12/31/2022  9:35 AM Chancy Milroy, MD [place] None    Johnston Ebbs, NP

## 2022-12-10 NOTE — Progress Notes (Signed)
Pt presents for ROB visit. No concerns at this time.  

## 2022-12-11 LAB — CERVICOVAGINAL ANCILLARY ONLY
Chlamydia: NEGATIVE
Comment: NEGATIVE
Comment: NEGATIVE
Comment: NORMAL
Neisseria Gonorrhea: NEGATIVE
Trichomonas: NEGATIVE

## 2022-12-14 LAB — CULTURE, BETA STREP (GROUP B ONLY): Strep Gp B Culture: NEGATIVE

## 2022-12-16 NOTE — Progress Notes (Unsigned)
   PRENATAL VISIT NOTE  Subjective:  Brandy Ruiz is a 32 y.o. G3P1011 at [redacted]w[redacted]d being seen today for ongoing prenatal care.  She is currently monitored for the following issues for this {Blank single:19197::"high-risk","low-risk"} pregnancy and has Bipolar 1 disorder; Anxiety during pregnancy; Tobacco smoking affecting pregnancy; Rh negative state in antepartum period; Chronic hypertension affecting pregnancy; Supervision of high-risk pregnancy, third trimester; Carrier of spinal muscular atrophy; Thalassemia alpha carrier; Cystic fibrosis carrier; Maternal morbid obesity, antepartum; Asthma affecting pregnancy, antepartum; ASCUS of cervix with negative high risk HPV; and Large for gestational age fetus affecting management of mother, antepartum, third trimester on their problem list.  Patient reports {sx:[id number]}.   .  .   . Denies leaking of fluid.   The following portions of the patient's history were reviewed and updated as appropriate: allergies, current medications, past family history, past medical history, past social history, past surgical history and problem list.   Objective:  There were no vitals filed for this visit.  Fetal Status:           General:  Alert, oriented and cooperative. Patient is in no acute distress.  Skin: Skin is warm and dry. No rash noted.   Cardiovascular: Normal heart rate noted  Respiratory: Normal respiratory effort, no problems with respiration noted  Abdomen: Soft, gravid, appropriate for gestational age.        Pelvic: {Blank single:19197::"Cervical exam performed in the presence of a chaperone","Cervical exam deferred"}        Extremities: Normal range of motion.     Mental Status: Normal mood and affect. Normal behavior. Normal judgment and thought content.   Assessment and Plan:  Pregnancy: G3P1011 at [redacted]w[redacted]d 1. Supervision of high-risk pregnancy, third trimester ***  2. [redacted] weeks gestation of pregnancy - GBS negative  3. BMI 40.0-44.9,  adult - 8/8 BPP on 4/4, continue antenatal testing   4. Chronic hypertension affecting pregnancy - no meds - *** Watch BP*** - continue antenatal testing until delivery   5. Asthma affecting pregnancy, antepartum - Daily and PRN meds   6. Large for gestational age fetus affecting management of mother, antepartum, third trimester - f/u growth scan is scheduled - AFI WDL -LGA EFW >99%  - Will consider delivery at 39 weeks if LGA is noted on f/u growth scan  7. Back pain in pregnancy ***  8. Carrier of spinal muscular atrophy 9. Thalassemia alpha carrier 10. Cystic fibrosis carrier ***  {Blank single:19197::"Term","Preterm"} labor symptoms and general obstetric precautions including but not limited to vaginal bleeding, contractions, leaking of fluid and fetal movement were reviewed in detail with the patient. Please refer to After Visit Summary for other counseling recommendations.   No follow-ups on file.  Future Appointments  Date Time Provider Department Center  12/17/2022  9:35 AM Corlis Hove, NP [facility] None  12/18/2022 10:30 AM [facility] NURSE [facility]  12/18/2022 10:45 AM [facility] NST [facility]  12/24/2022  7:15 AM [facility] NURSE [facility]  12/24/2022  7:30 AM [facility] US2 [facility]  12/24/2022  9:35 AM Adam Phenix, MD [facility] None  12/31/2022  9:35 AM Hermina Staggers, MD [facility] None  12/31/2022 10:30 AM [facility] NURSE [facility]  12/31/2022 10:45 AM [facility] NST [facility]    Corlis Hove, NP

## 2022-12-17 ENCOUNTER — Ambulatory Visit (INDEPENDENT_AMBULATORY_CARE_PROVIDER_SITE_OTHER): Payer: PRIVATE HEALTH INSURANCE | Admitting: Student

## 2022-12-17 VITALS — BP 134/83 | HR 94 | Wt 268.0 lb

## 2022-12-17 DIAGNOSIS — O10919 Unspecified pre-existing hypertension complicating pregnancy, unspecified trimester: Secondary | ICD-10-CM

## 2022-12-17 DIAGNOSIS — O99891 Other specified diseases and conditions complicating pregnancy: Secondary | ICD-10-CM

## 2022-12-17 DIAGNOSIS — Z6841 Body Mass Index (BMI) 40.0 and over, adult: Secondary | ICD-10-CM

## 2022-12-17 DIAGNOSIS — J45909 Unspecified asthma, uncomplicated: Secondary | ICD-10-CM

## 2022-12-17 DIAGNOSIS — Z148 Genetic carrier of other disease: Secondary | ICD-10-CM

## 2022-12-17 DIAGNOSIS — Z141 Cystic fibrosis carrier: Secondary | ICD-10-CM

## 2022-12-17 DIAGNOSIS — D563 Thalassemia minor: Secondary | ICD-10-CM

## 2022-12-17 DIAGNOSIS — O99519 Diseases of the respiratory system complicating pregnancy, unspecified trimester: Secondary | ICD-10-CM

## 2022-12-17 DIAGNOSIS — O0993 Supervision of high risk pregnancy, unspecified, third trimester: Secondary | ICD-10-CM

## 2022-12-17 DIAGNOSIS — O3663X0 Maternal care for excessive fetal growth, third trimester, not applicable or unspecified: Secondary | ICD-10-CM

## 2022-12-17 DIAGNOSIS — M549 Dorsalgia, unspecified: Secondary | ICD-10-CM

## 2022-12-17 DIAGNOSIS — Z3A37 37 weeks gestation of pregnancy: Secondary | ICD-10-CM

## 2022-12-18 ENCOUNTER — Ambulatory Visit: Payer: PRIVATE HEALTH INSURANCE | Attending: Obstetrics | Admitting: *Deleted

## 2022-12-18 ENCOUNTER — Ambulatory Visit: Payer: PRIVATE HEALTH INSURANCE | Admitting: *Deleted

## 2022-12-18 VITALS — BP 136/78 | HR 95

## 2022-12-18 DIAGNOSIS — O3663X0 Maternal care for excessive fetal growth, third trimester, not applicable or unspecified: Secondary | ICD-10-CM | POA: Insufficient documentation

## 2022-12-18 DIAGNOSIS — Z3A37 37 weeks gestation of pregnancy: Secondary | ICD-10-CM | POA: Diagnosis not present

## 2022-12-18 DIAGNOSIS — O3660X0 Maternal care for excessive fetal growth, unspecified trimester, not applicable or unspecified: Secondary | ICD-10-CM | POA: Diagnosis present

## 2022-12-18 DIAGNOSIS — O99213 Obesity complicating pregnancy, third trimester: Secondary | ICD-10-CM | POA: Insufficient documentation

## 2022-12-18 DIAGNOSIS — Z141 Cystic fibrosis carrier: Secondary | ICD-10-CM

## 2022-12-18 DIAGNOSIS — D563 Thalassemia minor: Secondary | ICD-10-CM

## 2022-12-18 DIAGNOSIS — E669 Obesity, unspecified: Secondary | ICD-10-CM | POA: Diagnosis not present

## 2022-12-18 DIAGNOSIS — O0993 Supervision of high risk pregnancy, unspecified, third trimester: Secondary | ICD-10-CM

## 2022-12-18 DIAGNOSIS — Z148 Genetic carrier of other disease: Secondary | ICD-10-CM

## 2022-12-18 NOTE — Procedures (Signed)
Aliviya Basten Jan 16, 1991 [redacted]w[redacted]d  Fetus A Non-Stress Test Interpretation for 12/18/22  Indication:  obese, LGA  Fetal Heart Rate A Mode: External Baseline Rate (A): 140 bpm Variability: Moderate Accelerations: 15 x 15 Decelerations: None Multiple birth?: No  Uterine Activity Mode: Toco Contraction Frequency (min): none Resting Tone Palpated: Relaxed  Interpretation (Fetal Testing) Nonstress Test Interpretation: Reactive Overall Impression: Reassuring for gestational age Comments: tracing reviewed byDr. Judeth Cornfield

## 2022-12-21 ENCOUNTER — Telehealth (HOSPITAL_COMMUNITY): Payer: Self-pay | Admitting: *Deleted

## 2022-12-21 ENCOUNTER — Encounter (HOSPITAL_COMMUNITY): Payer: Self-pay

## 2022-12-21 NOTE — Telephone Encounter (Signed)
Preadmission screen  

## 2022-12-22 ENCOUNTER — Encounter (HOSPITAL_COMMUNITY): Payer: Self-pay | Admitting: *Deleted

## 2022-12-22 ENCOUNTER — Telehealth (HOSPITAL_COMMUNITY): Payer: Self-pay | Admitting: *Deleted

## 2022-12-22 NOTE — Telephone Encounter (Signed)
Preadmission screen  

## 2022-12-23 ENCOUNTER — Other Ambulatory Visit: Payer: Self-pay | Admitting: Advanced Practice Midwife

## 2022-12-24 ENCOUNTER — Ambulatory Visit: Payer: PRIVATE HEALTH INSURANCE | Attending: Obstetrics

## 2022-12-24 ENCOUNTER — Other Ambulatory Visit: Payer: Self-pay | Admitting: Student

## 2022-12-24 ENCOUNTER — Ambulatory Visit: Payer: PRIVATE HEALTH INSURANCE | Admitting: *Deleted

## 2022-12-24 ENCOUNTER — Ambulatory Visit (INDEPENDENT_AMBULATORY_CARE_PROVIDER_SITE_OTHER): Payer: PRIVATE HEALTH INSURANCE | Admitting: Obstetrics & Gynecology

## 2022-12-24 VITALS — BP 140/82 | HR 93 | Wt 272.0 lb

## 2022-12-24 VITALS — BP 145/74 | HR 90

## 2022-12-24 DIAGNOSIS — O285 Abnormal chromosomal and genetic finding on antenatal screening of mother: Secondary | ICD-10-CM

## 2022-12-24 DIAGNOSIS — O0993 Supervision of high risk pregnancy, unspecified, third trimester: Secondary | ICD-10-CM

## 2022-12-24 DIAGNOSIS — O99213 Obesity complicating pregnancy, third trimester: Secondary | ICD-10-CM | POA: Diagnosis not present

## 2022-12-24 DIAGNOSIS — Z148 Genetic carrier of other disease: Secondary | ICD-10-CM

## 2022-12-24 DIAGNOSIS — O99210 Obesity complicating pregnancy, unspecified trimester: Secondary | ICD-10-CM

## 2022-12-24 DIAGNOSIS — O99891 Other specified diseases and conditions complicating pregnancy: Secondary | ICD-10-CM

## 2022-12-24 DIAGNOSIS — O360130 Maternal care for anti-D [Rh] antibodies, third trimester, not applicable or unspecified: Secondary | ICD-10-CM | POA: Diagnosis not present

## 2022-12-24 DIAGNOSIS — Z141 Cystic fibrosis carrier: Secondary | ICD-10-CM | POA: Insufficient documentation

## 2022-12-24 DIAGNOSIS — O99519 Diseases of the respiratory system complicating pregnancy, unspecified trimester: Secondary | ICD-10-CM

## 2022-12-24 DIAGNOSIS — O3663X0 Maternal care for excessive fetal growth, third trimester, not applicable or unspecified: Secondary | ICD-10-CM | POA: Diagnosis not present

## 2022-12-24 DIAGNOSIS — O09293 Supervision of pregnancy with other poor reproductive or obstetric history, third trimester: Secondary | ICD-10-CM

## 2022-12-24 DIAGNOSIS — D563 Thalassemia minor: Secondary | ICD-10-CM

## 2022-12-24 DIAGNOSIS — O10919 Unspecified pre-existing hypertension complicating pregnancy, unspecified trimester: Secondary | ICD-10-CM

## 2022-12-24 DIAGNOSIS — R030 Elevated blood-pressure reading, without diagnosis of hypertension: Secondary | ICD-10-CM

## 2022-12-24 DIAGNOSIS — E669 Obesity, unspecified: Secondary | ICD-10-CM

## 2022-12-24 DIAGNOSIS — O99340 Other mental disorders complicating pregnancy, unspecified trimester: Secondary | ICD-10-CM

## 2022-12-24 DIAGNOSIS — J45909 Unspecified asthma, uncomplicated: Secondary | ICD-10-CM

## 2022-12-24 DIAGNOSIS — F419 Anxiety disorder, unspecified: Secondary | ICD-10-CM

## 2022-12-24 DIAGNOSIS — Z3A38 38 weeks gestation of pregnancy: Secondary | ICD-10-CM

## 2022-12-24 MED ORDER — PREDNISONE 10 MG (21) PO TBPK: ORAL_TABLET | ORAL | 0 refills | Status: DC

## 2022-12-24 NOTE — Progress Notes (Signed)
ROB 38.[redacted] wks GA  Reports asthma is not well controlled right now due to increased allergens in the air. Reports using nebulizer BID and inhaler frequently. In addition using pulmicort. Despite these measures she reports "I just can't breath."  Requests SVE today. IOL scheduled for 01/01/23.

## 2022-12-24 NOTE — Progress Notes (Signed)
   PRENATAL VISIT NOTE  Subjective:  Brandy Ruiz is a 32 y.o. G3P1011 at [redacted]w[redacted]d being seen today for ongoing prenatal care.  She is currently monitored for the following issues for this high-risk pregnancy and has Bipolar 1 disorder; Anxiety during pregnancy; Tobacco smoking affecting pregnancy; Rh negative state in antepartum period; Chronic hypertension affecting pregnancy; Supervision of high-risk pregnancy, third trimester; Carrier of spinal muscular atrophy; Thalassemia alpha carrier; Cystic fibrosis carrier; Maternal morbid obesity, antepartum; Asthma affecting pregnancy, antepartum; ASCUS of cervix with negative high risk HPV; and Large for gestational age fetus affecting management of mother, antepartum, third trimester on their problem list.  Patient reports  persistent wheezing .  Contractions: Irritability. Vag. Bleeding: None.  Movement: Present. Denies leaking of fluid.   The following portions of the patient's history were reviewed and updated as appropriate: allergies, current medications, past family history, past medical history, past social history, past surgical history and problem list.   Objective:   Vitals:   12/24/22 0942  BP: (!) 140/82  Pulse: 93  Weight: 272 lb (123.4 kg)    Fetal Status: Fetal Heart Rate (bpm): 133   Movement: Present     General:  Alert, oriented and cooperative. Patient is in no acute distress.  Skin: Skin is warm and dry. No rash noted.   Cardiovascular: Normal heart rate noted  Respiratory: Normal respiratory effort, no problems with respiration noted  Abdomen: Soft, gravid, appropriate for gestational age.  Pain/Pressure: Present     Pelvic: Cervical exam performed in the presence of a chaperone        Extremities: Normal range of motion.  Edema: Trace  Mental Status: Normal mood and affect. Normal behavior. Normal judgment and thought content.   Assessment and Plan:  Pregnancy: G3P1011 at [redacted]w[redacted]d 1. Chronic hypertension affecting  pregnancy Continue to follow for worsening BP  2. Asthma affecting pregnancy, antepartum She is using her albuterol daily and mild wheezing present on exam, she is breathing comfortably, add steroid  - predniSONE (STERAPRED UNI-PAK 21 TAB) 10 MG (21) TBPK tablet; 2 tablet a day for 5 days then one a day until finished  Dispense: 15 tablet; Refill: 0  3. Anxiety during pregnancy   4. Supervision of high-risk pregnancy, third trimester   5. Maternal morbid obesity, antepartum   6. Large for gestational age fetus affecting management of mother, antepartum, third trimester (Term labor symptoms and general obstetric precautions including but not limited to vaginal bleeding, contractions, leaking of fluid and fetal movement were reviewed in detail with the patient. Please refer to After Visit Summary for other counseling recommendations.  IOL 39 weeks Return if symptoms worsen or fail to improve, for postpartum.  Future Appointments  Date Time Provider Department Center  12/31/2022  9:35 AM Hermina Staggers, MD [facility] None  01/01/2023  7:15 AM [facility] SCHED ROOM [facility] None    Scheryl Darter, MD

## 2022-12-30 ENCOUNTER — Other Ambulatory Visit: Payer: Self-pay | Admitting: Advanced Practice Midwife

## 2022-12-31 ENCOUNTER — Ambulatory Visit: Payer: PRIVATE HEALTH INSURANCE

## 2022-12-31 ENCOUNTER — Encounter: Payer: PRIVATE HEALTH INSURANCE | Admitting: Obstetrics and Gynecology

## 2023-01-01 ENCOUNTER — Encounter (HOSPITAL_COMMUNITY): Payer: Self-pay

## 2023-01-01 ENCOUNTER — Inpatient Hospital Stay (HOSPITAL_COMMUNITY): Admission: RE | Admit: 2023-01-01 | Payer: PRIVATE HEALTH INSURANCE | Source: Ambulatory Visit

## 2023-01-01 ENCOUNTER — Inpatient Hospital Stay (HOSPITAL_COMMUNITY)
Admission: AD | Admit: 2023-01-01 | Discharge: 2023-01-04 | DRG: 787 | Disposition: A | Discharge status: Home / Self Care | Payer: PRIVATE HEALTH INSURANCE | Attending: Obstetrics & Gynecology | Admitting: Obstetrics & Gynecology

## 2023-01-01 ENCOUNTER — Encounter (HOSPITAL_COMMUNITY): Payer: Self-pay | Admitting: Family Medicine

## 2023-01-01 ENCOUNTER — Other Ambulatory Visit: Payer: Self-pay

## 2023-01-01 DIAGNOSIS — Z87891 Personal history of nicotine dependence: Secondary | ICD-10-CM

## 2023-01-01 DIAGNOSIS — O9952 Diseases of the respiratory system complicating childbirth: Secondary | ICD-10-CM | POA: Diagnosis not present

## 2023-01-01 DIAGNOSIS — O1092 Unspecified pre-existing hypertension complicating childbirth: Secondary | ICD-10-CM | POA: Diagnosis present

## 2023-01-01 DIAGNOSIS — Z30017 Encounter for initial prescription of implantable subdermal contraceptive: Secondary | ICD-10-CM | POA: Diagnosis not present

## 2023-01-01 DIAGNOSIS — O34211 Maternal care for low transverse scar from previous cesarean delivery: Secondary | ICD-10-CM | POA: Diagnosis present

## 2023-01-01 DIAGNOSIS — Z880 Allergy status to penicillin: Secondary | ICD-10-CM | POA: Diagnosis not present

## 2023-01-01 DIAGNOSIS — O3663X0 Maternal care for excessive fetal growth, third trimester, not applicable or unspecified: Secondary | ICD-10-CM | POA: Diagnosis present

## 2023-01-01 DIAGNOSIS — Z148 Genetic carrier of other disease: Secondary | ICD-10-CM

## 2023-01-01 DIAGNOSIS — O99214 Obesity complicating childbirth: Secondary | ICD-10-CM | POA: Diagnosis present

## 2023-01-01 DIAGNOSIS — Z30430 Encounter for insertion of intrauterine contraceptive device: Secondary | ICD-10-CM | POA: Diagnosis not present

## 2023-01-01 DIAGNOSIS — Z23 Encounter for immunization: Secondary | ICD-10-CM

## 2023-01-01 DIAGNOSIS — Z3490 Encounter for supervision of normal pregnancy, unspecified, unspecified trimester: Principal | ICD-10-CM | POA: Diagnosis present

## 2023-01-01 DIAGNOSIS — Z6791 Unspecified blood type, Rh negative: Secondary | ICD-10-CM

## 2023-01-01 DIAGNOSIS — D62 Acute posthemorrhagic anemia: Secondary | ICD-10-CM | POA: Diagnosis not present

## 2023-01-01 DIAGNOSIS — O164 Unspecified maternal hypertension, complicating childbirth: Secondary | ICD-10-CM | POA: Diagnosis not present

## 2023-01-01 DIAGNOSIS — Z141 Cystic fibrosis carrier: Secondary | ICD-10-CM | POA: Diagnosis not present

## 2023-01-01 DIAGNOSIS — O26893 Other specified pregnancy related conditions, third trimester: Secondary | ICD-10-CM | POA: Diagnosis present

## 2023-01-01 DIAGNOSIS — Z3A39 39 weeks gestation of pregnancy: Secondary | ICD-10-CM | POA: Diagnosis not present

## 2023-01-01 DIAGNOSIS — O0993 Supervision of high risk pregnancy, unspecified, third trimester: Secondary | ICD-10-CM

## 2023-01-01 DIAGNOSIS — O9902 Anemia complicating childbirth: Secondary | ICD-10-CM | POA: Diagnosis present

## 2023-01-01 DIAGNOSIS — J45909 Unspecified asthma, uncomplicated: Secondary | ICD-10-CM | POA: Diagnosis not present

## 2023-01-01 DIAGNOSIS — O1002 Pre-existing essential hypertension complicating childbirth: Secondary | ICD-10-CM | POA: Diagnosis not present

## 2023-01-01 LAB — CBC
HCT: 33.3 % — ABNORMAL LOW (ref 36.0–46.0)
Hemoglobin: 11 g/dL — ABNORMAL LOW (ref 12.0–15.0)
MCH: 27.4 pg (ref 26.0–34.0)
MCHC: 33 g/dL (ref 30.0–36.0)
MCV: 83 fL (ref 80.0–100.0)
Platelets: 185 10*3/uL (ref 150–400)
RBC: 4.01 MIL/uL (ref 3.87–5.11)
RDW: 14.5 % (ref 11.5–15.5)
WBC: 9.4 10*3/uL (ref 4.0–10.5)
nRBC: 0 % (ref 0.0–0.2)

## 2023-01-01 LAB — TYPE AND SCREEN
ABO/RH(D): O NEG
Antibody Screen: NEGATIVE

## 2023-01-01 LAB — RPR: RPR Ser Ql: NONREACTIVE

## 2023-01-01 MED ORDER — LACTATED RINGERS IV SOLN: INTRAVENOUS | Status: DC

## 2023-01-01 MED ORDER — LEVONORGESTREL 20 MCG/DAY IU IUD: 1.0000 | INTRAUTERINE_SYSTEM | Freq: Once | INTRAUTERINE | Status: DC

## 2023-01-01 MED ORDER — DIPHENHYDRAMINE HCL 50 MG/ML IJ SOLN: 12.5000 mg | INTRAMUSCULAR | Status: DC | PRN

## 2023-01-01 MED ORDER — ONDANSETRON HCL 4 MG/2ML IJ SOLN
4.0000 mg | Freq: Four times a day (QID) | INTRAMUSCULAR | Status: DC | PRN
  Administered 2023-01-02: 4 mg via INTRAVENOUS
  Filled 2023-01-01: qty 2

## 2023-01-01 MED ORDER — MISOPROSTOL 50MCG HALF TABLET
50.0000 ug | ORAL_TABLET | Freq: Once | ORAL | Status: AC
  Administered 2023-01-01: 50 ug via ORAL

## 2023-01-01 MED ORDER — LIDOCAINE HCL (PF) 1 % IJ SOLN: 30.0000 mL | INTRAMUSCULAR | Status: DC | PRN

## 2023-01-01 MED ORDER — SOD CITRATE-CITRIC ACID 500-334 MG/5ML PO SOLN
30.0000 mL | ORAL | Status: DC | PRN
  Administered 2023-01-02: 30 mL via ORAL
  Filled 2023-01-01: qty 30

## 2023-01-01 MED ORDER — PHENYLEPHRINE 80 MCG/ML (10ML) SYRINGE FOR IV PUSH (FOR BLOOD PRESSURE SUPPORT): 80.0000 ug | PREFILLED_SYRINGE | INTRAVENOUS | Status: DC | PRN

## 2023-01-01 MED ORDER — EPHEDRINE 5 MG/ML INJ
10.0000 mg | INTRAVENOUS | Status: DC | PRN
  Filled 2023-01-01 (×2): qty 5

## 2023-01-01 MED ORDER — LACTATED RINGERS IV SOLN: 500.0000 mL | Freq: Once | INTRAVENOUS | Status: DC

## 2023-01-01 MED ORDER — FENTANYL-BUPIVACAINE-NACL 0.5-0.125-0.9 MG/250ML-% EP SOLN
12.0000 mL/h | EPIDURAL | Status: DC | PRN
  Administered 2023-01-02: 12 mL/h via EPIDURAL
  Filled 2023-01-01: qty 250

## 2023-01-01 MED ORDER — OXYCODONE-ACETAMINOPHEN 5-325 MG PO TABS: 2.0000 | ORAL_TABLET | ORAL | Status: DC | PRN

## 2023-01-01 MED ORDER — OXYCODONE-ACETAMINOPHEN 5-325 MG PO TABS: 1.0000 | ORAL_TABLET | ORAL | Status: DC | PRN

## 2023-01-01 MED ORDER — EPHEDRINE 5 MG/ML INJ
10.0000 mg | INTRAVENOUS | Status: AC | PRN
  Administered 2023-01-02 (×2): 10 mg via INTRAVENOUS

## 2023-01-01 MED ORDER — ACETAMINOPHEN 325 MG PO TABS: 650.0000 mg | ORAL_TABLET | ORAL | Status: DC | PRN

## 2023-01-01 MED ORDER — OXYTOCIN-SODIUM CHLORIDE 30-0.9 UT/500ML-% IV SOLN: 2.5000 [IU]/h | INTRAVENOUS | Status: DC

## 2023-01-01 MED ORDER — LACTATED RINGERS IV SOLN: 500.0000 mL | INTRAVENOUS | Status: DC | PRN

## 2023-01-01 MED ORDER — OXYTOCIN BOLUS FROM INFUSION: 333.0000 mL | Freq: Once | INTRAVENOUS | Status: DC

## 2023-01-01 MED ORDER — OXYTOCIN-SODIUM CHLORIDE 30-0.9 UT/500ML-% IV SOLN
1.0000 m[IU]/min | INTRAVENOUS | Status: DC
  Administered 2023-01-01: 2 m[IU]/min via INTRAVENOUS
  Filled 2023-01-01: qty 500

## 2023-01-01 MED ORDER — TERBUTALINE SULFATE 1 MG/ML IJ SOLN: 0.2500 mg | Freq: Once | INTRAMUSCULAR | Status: DC | PRN

## 2023-01-01 NOTE — H&P (Cosign Needed Addendum)
OBSTETRIC ADMISSION HISTORY AND PHYSICAL  Brandy Ruiz is a 32 y.o. female G3P1011 with IUP at [redacted]w[redacted]d by LMP presenting for IOL cHTN. She reports +FMs, No LOF, no VB, no blurry vision, headaches or peripheral edema, and RUQ pain.  She plans on breast feeding. She request ppIUD (Mirena) for birth control. She received her prenatal care at  [facility]    Dating: By LMP --->  Estimated Date of Delivery: 01/06/23  Sono:    @[redacted]w[redacted]d , CWD, normal anatomy, cephalic presentation, posterior placental lie, 4182g, 99% EFW   Prenatal History/Complications: cHTN (no meds), asthma, LGA (@[redacted]w[redacted]d  EFW 99% 4182 g, AC>99%), Genetic carrier (SMA, Alpha thal, cystic fibrosis)         Nursing Staff Provider  Office Location  [facility] Dating  12/28/2022, by Last Menstrual Period  [facility] Model Arly.Keller ] Traditional [ ]  Centering [ ]  Mom-Baby Dyad Anatomy US  08/12/22, normal, serial growth and antenatal testing as per MFM  Language   English      Flu Vaccine  08/05/22 Genetic/Carrier Screen  NIPS:    AFP:    [place]izon:CF carrier and alpha thal carrier ( pt testing recommended)  TDaP Vaccine   Declined 10/02/2022 Hgb A1C or  GTT Early  Hgb A1c MFr Bld 4.8 - 5.6 % 5.6       Third trimester   COVID Vaccine  No   LAB RESULTS   Rhogam  O/Negative/-- (10/26 1422) Rhogam given 10/02/22 Blood Type O/Negative/-- (10/26 1422)   Baby Feeding Plan  Breast  Antibody Negative (10/26 1422)  Contraception Pill Rubella 8.02 (10/26 1422)Immune  Circumcision  yes if female RPR Non Reactive (10/26 1422)   Pediatrician   [facility] HBsAg Negative (10/26 1422) NR  Support Person  [place] HCVAb Non Reactive (10/26 1422)   Prenatal Classes   HIV Non Reactive (10/26 1422)     BTL Consent   GBS (For PCN allergy, check sensitivities)   VBAC Consent   Pap       Diagnosis  Date Value Ref Range Status  07/02/2022 (A)   Final    - Atypical squamous cells of undetermined significance (ASC-US)             DME Rx Arly.Keller ] BP cuff [ ]  Weight  Scale Waterbirth  [ ]  Class [ ]  Consent [ ]  CNM visit  PHQ9 & GAD7 [ X ] new OB [ X] 28 weeks  [ x ] 36 weeks Induction  [ ]  Orders Entered [ ] Foley Y/N     Past Medical History: Past Medical History:  Diagnosis Date   Anxiety    Asthma    Bipolar 1 disorder, manic, mild (HCC)    Depression    Environmental and seasonal allergies    Heart murmur    History of gestational hypertension    PTSD (post-traumatic stress disorder)     Past Surgical History: Past Surgical History:  Procedure Laterality Date   ADENOIDECTOMY     FOOT SURGERY Right 2015   TONSILECTOMY/ADENOIDECTOMY WITH MYRINGOTOMY      Obstetrical History: OB History     Gravida  3   Para  1   Term  1   Preterm      AB  1   Living  1      SAB  1   IAB      Ectopic      Multiple  0   Live Births  1  Social History Social History   Socioeconomic History   Marital status: Significant Other    Spouse name: Not on file   Number of children: Not on file   Years of education: Not on file   Highest education level: Not on file  Occupational History   Not on file  Tobacco Use   Smoking status: Former    Types: Cigarettes    Quit date: 12/06/2020    Years since quitting: 2.0   Smokeless tobacco: Never  Vaping Use   Vaping Use: Never used  Substance and Sexual Activity   Alcohol use: Not Currently    Alcohol/week: 1.0 standard drink of alcohol    Types: 1 Glasses of wine per week    Comment: not since confirmed pregnancy   Drug use: Not Currently    Comment: CBD gummy, not since confirmed pregnancy   Sexual activity: Not Currently    Partners: Male    Birth control/protection: None  Other Topics Concern   Not on file  Social History Narrative   Not on file   Social Determinants of Health   Financial Resource Strain: Not on file  Food Insecurity: No Food Insecurity (01/01/2023)   Hunger Vital Sign    Worried About Running Out of Food in the Last Year: Never true     Ran Out of Food in the Last Year: Never true  Transportation Needs: No Transportation Needs (01/01/2023)   PRAPARE - Administrator, Civil Service (Medical): No    Lack of Transportation (Non-Medical): No  Physical Activity: Not on file  Stress: Not on file  Social Connections: Not on file    Family History: Family History  Problem Relation Age of Onset   Celiac disease Mother    Alcoholism Father    Heart Problems Father    Bipolar disorder Brother    Depression Brother    Anxiety disorder Brother    Diabetes Maternal Grandmother    Celiac disease Maternal Grandmother     Allergies: Allergies  Allergen Reactions   Shellfish Allergy Anaphylaxis   Amoxicillin Hives   Other     I'm not supposed to take Beta-blockers.   Penicillins     "I turn pink."   Latex Rash    Medications Prior to Admission  Medication Sig Dispense Refill Last Dose   albuterol (PROVENTIL) (2.5 MG/3ML) 0.083% nebulizer solution Take 3 mLs (2.5 mg total) by nebulization every 6 (six) hours as needed for wheezing or shortness of breath. 75 mL 12    budesonide (PULMICORT) 180 MCG/ACT inhaler Inhale 1 puff into the lungs in the morning and at bedtime. 1 each 2    predniSONE (STERAPRED UNI-PAK 21 TAB) 10 MG (21) TBPK tablet 2 tablet a day for 5 days then one a day until finished 15 tablet 0    Prenatal Vit-Fe Fumarate-FA (PREPLUS) 27-1 MG TABS Take 1 tablet by mouth daily. 90 tablet 4      Review of Systems   All systems reviewed and negative except as stated in HPI  Blood pressure 124/68, pulse 92, temperature 98.7 F (37.1 C), temperature source Oral, resp. rate 19, height 5\' 5"  (1.651 m), last menstrual period 03/23/2022, SpO2 100 %, currently breastfeeding. General appearance: alert, cooperative, and no distress Lungs: clear to auscultation bilaterally Heart: regular rate and rhythm Abdomen: soft, non-tender; bowel sounds normal Extremities: Homans sign is negative, no sign of  DVT Presentation: cephalic Fetal monitoringBaseline: 120 bpm, Variability: Good {> 6 bpm), Accelerations:  Reactive, and Decelerations: Absent Uterine activityNone     Prenatal labs: ABO, Rh: --/--/O NEG (04/26 1610) Antibody: NEG (04/26 9604) Rubella: 8.02 (10/26 1422) RPR: Non Reactive (01/26 0827)  HBsAg: Negative (10/26 1422)  HIV: Non Reactive (01/26 0827)  GBS: Negative/-- (04/04 0944)  1 hr Glucola normal  Genetic screening: known carrier of SMA, Alpha thal, cystic fibrosis Anatomy US normal   Prenatal Transfer Tool  Maternal Diabetes: No Genetic Screening: Abnormal:  Results: Other: Maternal Ultrasounds/Referrals: Normal Fetal Ultrasounds or other Referrals:  None Maternal Substance Abuse:  Yes:  Type: Smoker Significant Maternal Medications:  Meds include: Other:  Significant Maternal Lab Results:  Group B Strep negative and Rh negative Number of Prenatal Visits:greater than 3 verified prenatal visits Other Comments:  None  Results for orders placed or performed during the hospital encounter of 01/01/23 (from the past 24 hour(s))  CBC   Collection Time: 01/01/23  8:22 AM  Result Value Ref Range   WBC 9.4 4.0 - 10.5 K/uL   RBC 4.01 3.87 - 5.11 MIL/uL   Hemoglobin 11.0 (L) 12.0 - 15.0 g/dL   HCT 54.0 (L) 98.1 - 19.1 %   MCV 83.0 80.0 - 100.0 fL   MCH 27.4 26.0 - 34.0 pg   MCHC 33.0 30.0 - 36.0 g/dL   RDW 47.8 29.5 - 62.1 %   Platelets 185 150 - 400 K/uL   nRBC 0.0 0.0 - 0.2 %  Type and screen   Collection Time: 01/01/23  8:22 AM  Result Value Ref Range   ABO/RH(D) O NEG    Antibody Screen NEG    Sample Expiration      01/04/2023,2359 Performed at [facility], [address] [place], [place] [address]     Patient Active Problem List   Diagnosis Date Noted   Encounter for induction of labor 01/01/2023   Large for gestational age fetus affecting management of mother, antepartum, third trimester 11/12/2022   ASCUS of cervix with negative high risk  HPV 07/08/2022   Carrier of spinal muscular atrophy 07/02/2022   Thalassemia alpha carrier 07/02/2022   Cystic fibrosis carrier 07/02/2022   Maternal morbid obesity, antepartum (HCC) 07/02/2022   Asthma affecting pregnancy, antepartum 07/02/2022   Supervision of high-risk pregnancy, third trimester 05/27/2022   Chronic hypertension affecting pregnancy 08/20/2021   Rh negative state in antepartum period 07/23/2021   Bipolar 1 disorder (HCC) 02/18/2021   Anxiety during pregnancy 02/18/2021   Tobacco smoking affecting pregnancy 02/18/2021    Assessment/Plan:  Shilynn Hoch is a 32 y.o. G3P1011 at [redacted]w[redacted]d here for IOL cHTN  Prenatal History/Complications: cHTN (no meds), asthma, LGA (@[redacted]w[redacted]d  EFW 99% 4182 g, AC>99%), Genetic carrier (SMA, Alpha thal, cystic fibrosis)  #Labor: Cytotec 50/25 #Pain: Epidural desired #FWB: Cat 1 #ID: GBS negative #MOF: Breast #MOC: ppIUD (consent needed) #Circ:  Desired   #LGA [redacted]w[redacted]d EFW 99% 4182 g, AC>99% -Monitor closely  #cHTN No medications. Previous Pre-E labs normal  -Continue to monitor closely   #Genetic carrier Carrier of SMA, Alpha thal, cystic fibrosis. Seen genetics previously.  -Continue to monitor   #Rh Negative Rhogam given 10/02/2022.    Gilles Chiquito  01/01/2023, 9:37 AM   Midwife attestation: I have seen and examined this patient; I agree with above documentation in the resident's note.   Welma Mccombs is a 32 y.o. [id number] here for IOL for CHTN.  PE: BP 129/67 (BP Location: Right Arm)   Pulse 92   Temp 98.8 F (37.1 C) (Oral)  Resp 18   Ht 5\' 5"  (1.651 m)   Wt 272 lb 0.8 oz (123.4 kg)   LMP 03/23/2022 (Exact Date)   SpO2 100%   Breastfeeding Unknown   BMI 45.27 kg/m  Gen: calm comfortable, NAD Resp: normal effort, no distress Abd: gravid  ROS, labs, PMH reviewed  Plan: Admit to LD Labor: Plan for cytotec for cervical ripening, foley balloon at next exam Fetal monitoring: Category I ID: [id number]  negative  Sharen Counter, CNM  01/04/2023, 1:08 PM

## 2023-01-01 NOTE — Progress Notes (Signed)
Labor Progress Note  Brandy Ruiz is a 32 y.o. G3P1011 at [redacted]w[redacted]d presented for IOL cHTN  S: No acute events since last check in. Discussed FB placement with patient and she is agreeable   O:  BP 130/60   Pulse 82   Temp 98.7 F (37.1 C) (Oral)   Resp 19   Ht 5\' 5"  (1.651 m)   LMP 03/23/2022 (Exact Date)   SpO2 100%   BMI 45.26 kg/m  EFM: 125 bpm/Moderate variability/ 15x15 accels/ None decels  CVE: Dilation: 3 Effacement (%): 50 Station: -3 Presentation: Vertex Exam by:: Romelle Starcher CNM   A&P:  Brandy Ruiz is a 32 y.o. G3P1011 at [redacted]w[redacted]d here for IOL cHTN   Prenatal History/Complications: cHTN (no meds), asthma, LGA (@[redacted]w[redacted]d  EFW 99% 4182 g, AC>99%), Genetic carrier (SMA, Alpha thal, cystic fibrosis)  #Labor: Cytotec 50 mcg (0943), FB in (1430) #Pain: Family/Friend support and Epidural #FWB: CAT 1 #GBS negative #MOF: Breast #MOC: ppIUD (consent needed) #Circ:   Desired   #LGA [redacted]w[redacted]d EFW 99% 4182 g, AC>99% -Monitor closely   #cHTN No medications. Previous Pre-E labs normal  -Continue to monitor closely    #Genetic carrier Carrier of SMA, Alpha thal, cystic fibrosis. Seen genetics previously.  -Continue to monitor    #Rh Negative Rhogam given 10/02/2022  Gilles Chiquito PGY-3 01/01/23  2:40 PM

## 2023-01-01 NOTE — Progress Notes (Signed)
Brandy Ruiz is a 32 y.o. G3P1011 at [redacted]w[redacted]d by ultrasound admitted for induction of labor due to chronic hypertension previously on Procardia but self discontinued at 30 weeks. .  Subjective: Introductions exchanged. Patient doing well. Reports occasional contractions.   Objective: BP (!) 141/76   Pulse 88   Temp 98.5 F (36.9 C) (Oral)   Resp 19   Ht 5\' 5"  (1.651 m)   Wt 123.4 kg   LMP 03/23/2022 (Exact Date)   SpO2 100%   BMI 45.27 kg/m  No intake/output data recorded. No intake/output data recorded.  FHT:  FHR: 140 bpm, variability: moderate,  accelerations:  Present,  decelerations:  Absent UC:   irregular, every 7-10 minutes SVE:   Dilation: 5 Effacement (%): 50 Station: -2 Exam by:: L.Kirby,CNM  Labs: Lab Results  Component Value Date   WBC 9.4 01/01/2023   HGB 11.0 (L) 01/01/2023   HCT 33.3 (L) 01/01/2023   MCV 83.0 01/01/2023   PLT 185 01/01/2023   Patient Vitals for the past 24 hrs:  BP Temp Temp src Pulse Resp SpO2 Height Weight  01/01/23 2145 (!) 143/78 -- -- 92 -- -- -- --  01/01/23 2015 (!) 141/76 98.5 F (36.9 C) Oral 88 -- -- -- --  01/01/23 1932 -- -- -- -- -- -- 5\' 5"  (1.651 m) 123.4 kg  01/01/23 1341 130/60 -- -- 82 -- -- -- --  01/01/23 1151 105/70 -- -- 81 -- -- -- --  01/01/23 0852 -- -- -- -- -- -- 5\' 5"  (1.651 m) --  01/01/23 0829 124/68 98.7 F (37.1 C) Oral 92 19 100 % -- --    Assessment / Plan: Induction of labor due to gestational hypertension, s/p FB and AROM.   Labor:  Contractions spaced out to every 7-10 mins. Initiate Pit 2x2. Titrate up appropriately as needed.  Preeclampsia:  labs stable. BPs elevated but no severe range BPs.  Fetal Wellbeing:  Category I- Continuously monitoring  Pain Control:  Labor support without medications at this time. Patient desires epidural at some point. Patient may have Epidural upon request.  I/D:   GBS Negative  Anticipated MOD:  NSVD  Claudette Head, CNM 01/01/2023, 8:51 PM

## 2023-01-02 ENCOUNTER — Encounter (HOSPITAL_COMMUNITY): Payer: Self-pay | Admitting: Family Medicine

## 2023-01-02 ENCOUNTER — Inpatient Hospital Stay (HOSPITAL_COMMUNITY): Payer: PRIVATE HEALTH INSURANCE | Admitting: Anesthesiology

## 2023-01-02 ENCOUNTER — Encounter (HOSPITAL_COMMUNITY)
Admission: AD | Disposition: A | Discharge status: Home / Self Care | Payer: Self-pay | Source: Home / Self Care | Attending: Obstetrics & Gynecology

## 2023-01-02 DIAGNOSIS — O164 Unspecified maternal hypertension, complicating childbirth: Secondary | ICD-10-CM

## 2023-01-02 DIAGNOSIS — O1002 Pre-existing essential hypertension complicating childbirth: Secondary | ICD-10-CM

## 2023-01-02 DIAGNOSIS — Z30017 Encounter for initial prescription of implantable subdermal contraceptive: Secondary | ICD-10-CM

## 2023-01-02 DIAGNOSIS — J45909 Unspecified asthma, uncomplicated: Secondary | ICD-10-CM

## 2023-01-02 DIAGNOSIS — O3663X0 Maternal care for excessive fetal growth, third trimester, not applicable or unspecified: Secondary | ICD-10-CM

## 2023-01-02 DIAGNOSIS — Z3A39 39 weeks gestation of pregnancy: Secondary | ICD-10-CM

## 2023-01-02 DIAGNOSIS — O9952 Diseases of the respiratory system complicating childbirth: Secondary | ICD-10-CM

## 2023-01-02 SURGERY — Surgical Case
Anesthesia: Epidural

## 2023-01-02 MED ORDER — LIDOCAINE HCL (PF) 1 % IJ SOLN
INTRAMUSCULAR | Status: DC | PRN
  Administered 2023-01-02: 11 mL via EPIDURAL

## 2023-01-02 MED ORDER — FENTANYL CITRATE (PF) 100 MCG/2ML IJ SOLN
INTRAMUSCULAR | Status: DC | PRN
  Administered 2023-01-02: 100 ug via EPIDURAL

## 2023-01-02 MED ORDER — SENNOSIDES-DOCUSATE SODIUM 8.6-50 MG PO TABS
2.0000 | ORAL_TABLET | Freq: Every day | ORAL | Status: DC
  Administered 2023-01-03 – 2023-01-04 (×2): 2 via ORAL
  Filled 2023-01-02 (×2): qty 2

## 2023-01-02 MED ORDER — TRANEXAMIC ACID-NACL 1000-0.7 MG/100ML-% IV SOLN
INTRAVENOUS | Status: DC | PRN
  Administered 2023-01-02: 1000 mg via INTRAVENOUS

## 2023-01-02 MED ORDER — KETOROLAC TROMETHAMINE 30 MG/ML IJ SOLN: 30.0000 mg | Freq: Four times a day (QID) | INTRAMUSCULAR | Status: DC | PRN

## 2023-01-02 MED ORDER — MENTHOL 3 MG MT LOZG: 1.0000 | LOZENGE | OROMUCOSAL | Status: DC | PRN

## 2023-01-02 MED ORDER — KETOROLAC TROMETHAMINE 30 MG/ML IJ SOLN
INTRAMUSCULAR | Status: AC
  Filled 2023-01-02: qty 1

## 2023-01-02 MED ORDER — KETOROLAC TROMETHAMINE 30 MG/ML IJ SOLN
30.0000 mg | Freq: Four times a day (QID) | INTRAMUSCULAR | Status: AC
  Administered 2023-01-02 – 2023-01-03 (×4): 30 mg via INTRAVENOUS
  Filled 2023-01-02 (×4): qty 1

## 2023-01-02 MED ORDER — DIPHENHYDRAMINE HCL 50 MG/ML IJ SOLN: 12.5000 mg | INTRAMUSCULAR | Status: DC | PRN

## 2023-01-02 MED ORDER — CLINDAMYCIN PHOSPHATE 900 MG/50ML IV SOLN
INTRAVENOUS | Status: AC
  Filled 2023-01-02: qty 50

## 2023-01-02 MED ORDER — OXYCODONE HCL 5 MG PO TABS
5.0000 mg | ORAL_TABLET | ORAL | Status: DC | PRN
  Administered 2023-01-03 – 2023-01-04 (×8): 10 mg via ORAL
  Filled 2023-01-02 (×8): qty 2

## 2023-01-02 MED ORDER — IBUPROFEN 600 MG PO TABS
600.0000 mg | ORAL_TABLET | Freq: Four times a day (QID) | ORAL | Status: DC
  Administered 2023-01-03 – 2023-01-04 (×4): 600 mg via ORAL
  Filled 2023-01-02 (×4): qty 1

## 2023-01-02 MED ORDER — FUROSEMIDE 20 MG PO TABS
20.0000 mg | ORAL_TABLET | Freq: Every day | ORAL | Status: DC
  Administered 2023-01-03 – 2023-01-04 (×2): 20 mg via ORAL
  Filled 2023-01-02 (×2): qty 1

## 2023-01-02 MED ORDER — KETOROLAC TROMETHAMINE 30 MG/ML IJ SOLN
30.0000 mg | Freq: Once | INTRAMUSCULAR | Status: AC
  Administered 2023-01-02: 30 mg via INTRAVENOUS

## 2023-01-02 MED ORDER — ACETAMINOPHEN 500 MG PO TABS
1000.0000 mg | ORAL_TABLET | Freq: Four times a day (QID) | ORAL | Status: DC
  Administered 2023-01-02 – 2023-01-04 (×8): 1000 mg via ORAL
  Filled 2023-01-02 (×8): qty 2

## 2023-01-02 MED ORDER — GENTAMICIN SULFATE 40 MG/ML IJ SOLN
5.0000 mg/kg | Freq: Once | INTRAVENOUS | Status: AC
  Administered 2023-01-02: 420 mg via INTRAVENOUS
  Filled 2023-01-02: qty 10.5

## 2023-01-02 MED ORDER — ONDANSETRON HCL 4 MG/2ML IJ SOLN
INTRAMUSCULAR | Status: DC | PRN
  Administered 2023-01-02: 4 mg via INTRAVENOUS

## 2023-01-02 MED ORDER — DROPERIDOL 2.5 MG/ML IJ SOLN: 0.6250 mg | Freq: Once | INTRAMUSCULAR | Status: DC | PRN

## 2023-01-02 MED ORDER — TRANEXAMIC ACID-NACL 1000-0.7 MG/100ML-% IV SOLN
INTRAVENOUS | Status: AC
  Filled 2023-01-02: qty 100

## 2023-01-02 MED ORDER — ENOXAPARIN SODIUM 60 MG/0.6ML IJ SOSY
60.0000 mg | PREFILLED_SYRINGE | INTRAMUSCULAR | Status: DC
  Administered 2023-01-03 – 2023-01-04 (×2): 60 mg via SUBCUTANEOUS
  Filled 2023-01-02 (×2): qty 0.6

## 2023-01-02 MED ORDER — LEVONORGESTREL 20 MCG/DAY IU IUD
INTRAUTERINE_SYSTEM | INTRAUTERINE | Status: AC
  Filled 2023-01-02: qty 1

## 2023-01-02 MED ORDER — WITCH HAZEL-GLYCERIN EX PADS: 1.0000 | MEDICATED_PAD | CUTANEOUS | Status: DC | PRN

## 2023-01-02 MED ORDER — LIDOCAINE-EPINEPHRINE (PF) 2 %-1:200000 IJ SOLN
INTRAMUSCULAR | Status: DC | PRN
  Administered 2023-01-02 (×2): 5 mL via EPIDURAL

## 2023-01-02 MED ORDER — ZOLPIDEM TARTRATE 5 MG PO TABS: 5.0000 mg | ORAL_TABLET | Freq: Every evening | ORAL | Status: DC | PRN

## 2023-01-02 MED ORDER — PRENATAL MULTIVITAMIN CH
1.0000 | ORAL_TABLET | Freq: Every day | ORAL | Status: DC
  Administered 2023-01-03 – 2023-01-04 (×2): 1 via ORAL
  Filled 2023-01-02 (×2): qty 1

## 2023-01-02 MED ORDER — SODIUM CHLORIDE 0.9 % IV SOLN
INTRAVENOUS | Status: DC | PRN
  Administered 2023-01-02: 500 mg via INTRAVENOUS

## 2023-01-02 MED ORDER — PHENYLEPHRINE 80 MCG/ML (10ML) SYRINGE FOR IV PUSH (FOR BLOOD PRESSURE SUPPORT)
PREFILLED_SYRINGE | INTRAVENOUS | Status: AC
  Filled 2023-01-02: qty 20

## 2023-01-02 MED ORDER — DEXMEDETOMIDINE HCL IN NACL 200 MCG/50ML IV SOLN
INTRAVENOUS | Status: DC | PRN
  Administered 2023-01-02 (×2): 8 ug via INTRAVENOUS

## 2023-01-02 MED ORDER — SODIUM CHLORIDE 0.9 % IR SOLN
Status: DC | PRN
  Administered 2023-01-02 (×2): 1000 mL

## 2023-01-02 MED ORDER — SIMETHICONE 80 MG PO CHEW
80.0000 mg | CHEWABLE_TABLET | Freq: Three times a day (TID) | ORAL | Status: DC
  Administered 2023-01-02 – 2023-01-04 (×6): 80 mg via ORAL
  Filled 2023-01-02 (×6): qty 1

## 2023-01-02 MED ORDER — COCONUT OIL OIL: 1.0000 | TOPICAL_OIL | Status: DC | PRN

## 2023-01-02 MED ORDER — ONDANSETRON HCL 4 MG/2ML IJ SOLN: 4.0000 mg | Freq: Three times a day (TID) | INTRAMUSCULAR | Status: DC | PRN

## 2023-01-02 MED ORDER — SIMETHICONE 80 MG PO CHEW: 80.0000 mg | CHEWABLE_TABLET | ORAL | Status: DC | PRN

## 2023-01-02 MED ORDER — ACETAMINOPHEN 10 MG/ML IV SOLN
INTRAVENOUS | Status: DC | PRN
  Administered 2023-01-02: 1000 mg via INTRAVENOUS

## 2023-01-02 MED ORDER — DIPHENHYDRAMINE HCL 25 MG PO CAPS: 25.0000 mg | ORAL_CAPSULE | ORAL | Status: DC | PRN

## 2023-01-02 MED ORDER — OXYCODONE HCL 5 MG PO TABS
5.0000 mg | ORAL_TABLET | Freq: Once | ORAL | Status: AC
  Administered 2023-01-02: 5 mg via ORAL
  Filled 2023-01-02: qty 1

## 2023-01-02 MED ORDER — SODIUM CHLORIDE 0.9% FLUSH: 3.0000 mL | INTRAVENOUS | Status: DC | PRN

## 2023-01-02 MED ORDER — OXYTOCIN-SODIUM CHLORIDE 30-0.9 UT/500ML-% IV SOLN
INTRAVENOUS | Status: DC | PRN
  Administered 2023-01-02: 30 [IU] via INTRAVENOUS

## 2023-01-02 MED ORDER — DIBUCAINE (PERIANAL) 1 % EX OINT: 1.0000 | TOPICAL_OINTMENT | CUTANEOUS | Status: DC | PRN

## 2023-01-02 MED ORDER — ONDANSETRON HCL 4 MG/2ML IJ SOLN
INTRAMUSCULAR | Status: AC
  Filled 2023-01-02: qty 2

## 2023-01-02 MED ORDER — NALOXONE HCL 4 MG/10ML IJ SOLN: 1.0000 ug/kg/h | INTRAVENOUS | Status: DC | PRN

## 2023-01-02 MED ORDER — ACETAMINOPHEN 500 MG PO TABS: 1000.0000 mg | ORAL_TABLET | Freq: Four times a day (QID) | ORAL | Status: DC

## 2023-01-02 MED ORDER — CLINDAMYCIN PHOSPHATE 900 MG/50ML IV SOLN
INTRAVENOUS | Status: DC | PRN
  Administered 2023-01-02: 900 mg via INTRAVENOUS

## 2023-01-02 MED ORDER — DIPHENHYDRAMINE HCL 25 MG PO CAPS: 25.0000 mg | ORAL_CAPSULE | Freq: Four times a day (QID) | ORAL | Status: DC | PRN

## 2023-01-02 MED ORDER — FENTANYL CITRATE (PF) 100 MCG/2ML IJ SOLN
INTRAMUSCULAR | Status: AC
  Filled 2023-01-02: qty 2

## 2023-01-02 MED ORDER — OXYTOCIN-SODIUM CHLORIDE 30-0.9 UT/500ML-% IV SOLN
2.5000 [IU]/h | INTRAVENOUS | Status: AC
  Administered 2023-01-02: 2.5 [IU]/h via INTRAVENOUS
  Filled 2023-01-02: qty 500

## 2023-01-02 MED ORDER — FENTANYL CITRATE (PF) 100 MCG/2ML IJ SOLN: 25.0000 ug | INTRAMUSCULAR | Status: DC | PRN

## 2023-01-02 MED ORDER — LACTATED RINGERS AMNIOINFUSION: INTRAVENOUS | Status: DC

## 2023-01-02 MED ORDER — PHENYLEPHRINE 80 MCG/ML (10ML) SYRINGE FOR IV PUSH (FOR BLOOD PRESSURE SUPPORT)
PREFILLED_SYRINGE | INTRAVENOUS | Status: DC | PRN
  Administered 2023-01-02: 160 ug via INTRAVENOUS
  Administered 2023-01-02 (×6): 80 ug via INTRAVENOUS

## 2023-01-02 MED ORDER — NALOXONE HCL 0.4 MG/ML IJ SOLN: 0.4000 mg | INTRAMUSCULAR | Status: DC | PRN

## 2023-01-02 SURGICAL SUPPLY — 37 items
BENZOIN TINCTURE PRP APPL 2/3 (GAUZE/BANDAGES/DRESSINGS) IMPLANT
CHLORAPREP W/TINT 26 (MISCELLANEOUS) ×2 IMPLANT
CLAMP UMBILICAL CORD (MISCELLANEOUS) ×1 IMPLANT
CLOTH BEACON ORANGE TIMEOUT ST (SAFETY) ×1 IMPLANT
DERMABOND ADVANCED .7 DNX12 (GAUZE/BANDAGES/DRESSINGS) ×2 IMPLANT
DRSG OPSITE POSTOP 4X10 (GAUZE/BANDAGES/DRESSINGS) ×1 IMPLANT
ELECT REM PT RETURN 9FT ADLT (ELECTROSURGICAL) ×1
ELECTRODE REM PT RTRN 9FT ADLT (ELECTROSURGICAL) ×1 IMPLANT
EXTRACTOR VACUUM BELL STYLE (SUCTIONS) IMPLANT
GAUZE SPONGE 4X4 12PLY STRL LF (GAUZE/BANDAGES/DRESSINGS) IMPLANT
GLOVE BIOGEL PI IND STRL 7.0 (GLOVE) ×1 IMPLANT
GLOVE BIOGEL PI IND STRL 8 (GLOVE) ×1 IMPLANT
GLOVE ECLIPSE 8.0 STRL XLNG CF (GLOVE) ×1 IMPLANT
GOWN STRL REUS W/TWL LRG LVL3 (GOWN DISPOSABLE) ×2 IMPLANT
KIT ABG SYR 3ML LUER SLIP (SYRINGE) ×1 IMPLANT
NDL HYPO 18GX1.5 BLUNT FILL (NEEDLE) ×1 IMPLANT
NDL HYPO 25X5/8 SAFETYGLIDE (NEEDLE) ×1 IMPLANT
NEEDLE HYPO 18GX1.5 BLUNT FILL (NEEDLE) ×1 IMPLANT
NEEDLE HYPO 22GX1.5 SAFETY (NEEDLE) ×1 IMPLANT
NEEDLE HYPO 25X5/8 SAFETYGLIDE (NEEDLE) ×1 IMPLANT
NS IRRIG 1000ML POUR BTL (IV SOLUTION) ×1 IMPLANT
PACK C SECTION WH (CUSTOM PROCEDURE TRAY) ×1 IMPLANT
PAD OB MATERNITY 4.3X12.25 (PERSONAL CARE ITEMS) ×1 IMPLANT
RTRCTR C-SECT PINK 25CM LRG (MISCELLANEOUS) IMPLANT
STRIP CLOSURE SKIN 1/2X4 (GAUZE/BANDAGES/DRESSINGS) IMPLANT
SUT CHROMIC 0 CT 1 (SUTURE) ×1 IMPLANT
SUT MNCRL 0 VIOLET CTX 36 (SUTURE) ×2 IMPLANT
SUT MONOCRYL 0 CTX 36 (SUTURE) ×2
SUT PLAIN 2 0 (SUTURE)
SUT PLAIN 2 0 XLH (SUTURE) IMPLANT
SUT PLAIN ABS 2-0 CT1 27XMFL (SUTURE) IMPLANT
SUT VIC AB 0 CTX 36 (SUTURE) ×1
SUT VIC AB 0 CTX36XBRD ANBCTRL (SUTURE) ×1 IMPLANT
SUT VIC AB 4-0 KS 27 (SUTURE) IMPLANT
TOWEL OR 17X24 6PK STRL BLUE (TOWEL DISPOSABLE) ×1 IMPLANT
TRAY FOLEY W/BAG SLVR 14FR LF (SET/KITS/TRAYS/PACK) IMPLANT
WATER STERILE IRR 1000ML POUR (IV SOLUTION) ×1 IMPLANT

## 2023-01-02 NOTE — Op Note (Signed)
Operative Note   Patient: Brandy Ruiz  Date of Procedure: 01/01/2023 - 01/02/2023  Procedure: Primary Low Transverse Cesarean   Indications: non-reassuring fetal status  Pre-operative Diagnosis: Unscheduled Cesarean Section, See Delivery Summary.   Post-operative Diagnosis: IUD insertion: Mirena  TOLAC Candidate: Yes   Surgeon: Surgeon(s) and Role:    * Eure, Amaryllis Dyke, MD - Primary    * Adaliah Hiegel, Cyndi Lennert, MD - Fellow  Assistants: none  An experienced assistant was required given the standard of surgical care given the complexity of the case.  This assistant was needed for exposure, dissection, suctioning, retraction, instrument exchange, assisting with delivery with administration of fundal pressure, and for overall help during the procedure.   Anesthesia: epidural  Anesthesiologist: Kaylyn Layer, MD   Antibiotics: Gentamicin, Clindamycin, and Azithromycin   Estimated Blood Loss: 1238 ml   Total IV Fluids: 1000 ml  Urine Output:  250 cc OF clear urine  Specimens: Placenta to pathology    Complications:  PPH     Indications: Brandy Ruiz is a 32 y.o. [id number] with an IUP [redacted]w[redacted]d presenting for unscheduled, urgent cesarean secondary to the indications listed above. Clinical course notable for progression to complete and pushing. FHR baseline increase to 200 w/ recurrent decelerations .  The risks of cesarean section discussed with the patient included but were not limited to: bleeding which may require transfusion or reoperation; infection which may require antibiotics; injury to bowel, bladder, ureters or other surrounding organs; injury to the fetus; need for additional procedures including hysterectomy in the event of a life-threatening hemorrhage; placental abnormalities with subsequent pregnancies, incisional problems, thromboembolic phenomenon and other postoperative/anesthesia complications. The patient concurred with the proposed plan, giving informed  written consent for the procedure. Patient NPO status waived given urgency of case. Anesthesia and OR aware. Preoperative prophylactic antibiotics and SCDs ordered on call to the OR.    Findings: Viable infant in cephalic presentation, no nuchal cord present. Apgars 2 , 3 , 7 . Weight 4070 g . Clear amniotic fluid. Normal placenta, three vessel cord. Normal uterus, Normal bilateral fallopian tubes, Normal bilateral ovaries.  Procedure Details: A Time Out was held and the above information confirmed. The patient received intravenous antibiotics and had sequential compression devices applied to her lower extremities preoperatively. The patient was taken back to the operative suite where epidural anesthesia was administered. After induction of anesthesia, the patient was draped and prepped in the usual sterile manner and placed in a dorsal supine position with a leftward tilt. A low transverse skin incision was made with scalpel and carried down through the subcutaneous tissue to the fascia. Fascial incision was made and extended transversely. The fascia was separated from the underlying rectus tissue superiorly and inferiorly. The rectus muscles were separated in the midline bluntly and the peritoneum was entered bluntly. An Alexis retractor was placed to aid in visualization of the uterus. A bladder flap was not developed. A low transverse uterine incision was made. The infant was successfully delivered from cephalic, direct OP presentation, the umbilical cord was clamped after 30 seconds. Cord ph was sent, and cord blood was obtained for evaluation. The placenta was removed Intact and appeared normal.   A Mirena IUD was then removed from it's packaging in a sterile manner and the strings were trimmed to approximately 10 cm. The IUD was placed manually at the uterine fundus and the strings were passed through the cervical os with a Kelly clamp.   The uterine incision was closed with  a single layer running  locked suture of 0-Monocryl.Uterine extensions appreciated on both sides. Due to ongoing bleeding a second layer of 0 Monocryl was placed in an imbricating fashion, after which there was excellent hemostasis. The abdomen and the pelvis were cleared of all clot and debris and the Jon Gills was removed. Hemostasis was confirmed on all surfaces.  The peritoneum was reapproximated using 2-0 plain gut . The fascia was then closed using 0 Vicryl in a running fashion. The subcutaneous layer was reapproximated with 2-0 plain gut suture. The skin was closed with a 4-0 vicryl subcuticular stitch. The patient tolerated the procedure well. Sponge, lap, instrument and needle counts were correct x 2. She was taken to the recovery room in stable condition.  Disposition: PACU - hemodynamically stable.    Signed: Celedonio Savage, MD [facility] Fellow  Center for [facility], [facility]

## 2023-01-02 NOTE — Anesthesia Postprocedure Evaluation (Signed)
Anesthesia Post Note  Patient: Brandy Ruiz  Procedure(s) Performed: CESAREAN SECTION     Patient location during evaluation: PACU Anesthesia Type: Epidural Level of consciousness: awake and alert Pain management: pain level controlled Vital Signs Assessment: post-procedure vital signs reviewed and stable Respiratory status: spontaneous breathing, nonlabored ventilation and respiratory function stable Cardiovascular status: blood pressure returned to baseline Postop Assessment: no apparent nausea or vomiting and epidural receding Anesthetic complications: no   No notable events documented.  Last Vitals:  Vitals:   01/02/23 1300 01/02/23 1337  BP: 131/73 135/76  Pulse: 99 92  Resp: 18 18  Temp: 37.4 C 36.9 C  SpO2: 99% 99%    Last Pain:  Vitals:   01/02/23 1300  TempSrc: Oral  PainSc: 0-No pain   Pain Goal:                   Shanda Howells

## 2023-01-02 NOTE — Discharge Summary (Signed)
Postpartum Discharge Summary     Patient Name: Brandy Ruiz DOB: [birth year] MRN: [id number]  Date of admission: 01/01/2023 Delivery date:01/02/2023  Delivering provider: Lazaro Arms  Date of discharge: 01/04/2023  Admitting diagnosis: Encounter for induction of labor [Z34.90] Intrauterine pregnancy: [redacted]w[redacted]d     Secondary diagnosis:  Principal Problem:   Encounter for induction of labor  Additional problems: Anemia due to acute blood loss    Discharge diagnosis: Term Pregnancy Delivered, CHTN, and PPH                                              Post partum procedures:rhogam Augmentation: AROM, Pitocin, Cytotec, and IP Foley Complications: Hemorrhage>1063mL  Hospital course: Induction of Labor With Cesarean Section   32 y.o. yo [id number] at [redacted]w[redacted]d was admitted to the hospital 01/01/2023 for induction of labor. Patient had a labor course significant for progression to complete and pushing. . The patient went for cesarean section due to Non-Reassuring FHR. Delivery details are as follows: Membrane Rupture Time/Date: 8:03 PM ,01/01/2023   Delivery Method:C-Section, Low Transverse  Details of operation can be found in separate operative Note.  Patient had a postpartum course complicated by anemia. She was started on oral iron. She is ambulating, tolerating a regular diet, passing flatus, and urinating well.  Patient is discharged home in stable condition on 01/04/23.      Newborn Data: Birth date:01/02/2023  Birth time:10:46 AM  Gender:Female  Living status:Living  Apgars:2 ,3  Weight:4070 g                                Magnesium Sulfate received: No BMZ received: No Rhophylac:Yes MMR:No T-DaP:Given prenatally Flu: No Transfusion:No  Physical exam  Vitals:   01/03/23 0615 01/03/23 1614 01/03/23 2045 01/04/23 0515  BP: 132/66 (!) 107/47 (!) 110/58 129/67  Pulse: 86 83 86 92  Resp: 17 18 18 18   Temp:  98.2 F (36.8 C) 99 F (37.2 C) 98.8 F (37.1 C)  TempSrc:  Oral  Oral Oral  SpO2: 99% 99% 100% 100%  Weight:      Height:       General: alert, cooperative, and no distress Lochia: appropriate Uterine Fundus: firm Incision: Healing well with no significant drainage, No significant erythema, Dressing is clean, dry, and intact DVT Evaluation: No evidence of DVT seen on physical exam. Labs: Lab Results  Component Value Date   WBC 17.4 (H) 01/03/2023   HGB 9.2 (L) 01/03/2023   HCT 28.3 (L) 01/03/2023   MCV 84.0 01/03/2023   PLT 143 (L) 01/03/2023      Latest Ref Rng & Units 11/26/2022   11:53 AM  CMP  Glucose 70 - 99 mg/dL 366   BUN 6 - 20 mg/dL 4   Creatinine 4.40 - 3.47 mg/dL 4.25   Sodium 956 - 387 mmol/L 138   Potassium 3.5 - 5.2 mmol/L 3.9   Chloride 96 - 106 mmol/L 106   CO2 20 - 29 mmol/L 17   Calcium 8.7 - 10.2 mg/dL 8.5   Total Protein 6.0 - 8.5 g/dL 6.2   Total Bilirubin 0.0 - 1.2 mg/dL 0.3   Alkaline Phos 44 - 121 IU/L 176   AST 0 - 40 IU/L 19   ALT 0 - 32 IU/L 9  Edinburgh Score:    01/03/2023   11:20 PM  Edinburgh Postnatal Depression Scale Screening Tool  I have been able to laugh and see the funny side of things. 0  I have looked forward with enjoyment to things. 0  I have blamed myself unnecessarily when things went wrong. 0  I have been anxious or worried for no good reason. 2  I have felt scared or panicky for no good reason. 0  Things have been getting on top of me. 1  I have been so unhappy that I have had difficulty sleeping. 2  I have felt sad or miserable. 1  I have been so unhappy that I have been crying. 1  The thought of harming myself has occurred to me. 0  Edinburgh Postnatal Depression Scale Total 7     After visit meds:  Allergies as of 01/04/2023       Reactions   Shellfish Allergy Anaphylaxis   Amoxicillin Hives   Other    I'm not supposed to take Beta-blockers.   Penicillins    "I turn pink."   Latex Rash        Medication List     TAKE these medications    acetaminophen 500 MG  tablet Commonly known as: TYLENOL Take 2 tablets (1,000 mg total) by mouth every 6 (six) hours.   albuterol (2.5 MG/3ML) 0.083% nebulizer solution Commonly known as: PROVENTIL Take 3 mLs (2.5 mg total) by nebulization every 6 (six) hours as needed for wheezing or shortness of breath.   budesonide 180 MCG/ACT inhaler Commonly known as: PULMICORT Inhale 1 puff into the lungs in the morning and at bedtime.   ferrous sulfate 325 (65 FE) MG tablet Take 1 tablet (325 mg total) by mouth daily with breakfast.   furosemide 20 MG tablet Commonly known as: LASIX Take 1 tablet (20 mg total) by mouth daily for 5 days.   ibuprofen 600 MG tablet Commonly known as: ADVIL Take 1 tablet (600 mg total) by mouth every 6 (six) hours.   oxyCODONE 5 MG immediate release tablet Commonly known as: Oxy IR/ROXICODONE Take 1 tablet (5 mg total) by mouth every 4 (four) hours as needed for moderate pain.   predniSONE 10 MG (21) Tbpk tablet Commonly known as: STERAPRED UNI-PAK 21 TAB 2 tablet a day for 5 days then one a day until finished   PrePLUS 27-1 MG Tabs Take 1 tablet by mouth daily.         Discharge home in stable condition Infant Feeding: Bottle and Breast Infant Disposition:home with mother Discharge instruction: per After Visit Summary and Postpartum booklet. Activity: Advance as tolerated. Pelvic rest for 6 weeks.  Diet: routine diet Future Appointments: Future Appointments  Date Time Provider Department Center  01/12/2023  8:20 AM [facility] NURSE [facility] None  02/12/2023  9:35 AM Constant, Gigi Gin, MD [facility] None   Follow up Visit: Message sent   Please schedule this patient for a In person postpartum visit in 6 weeks with the following provider: Any provider. Additional Postpartum F/U:Incision check 1 week and BP check 1 week  High risk pregnancy complicated by: HTN Delivery mode:  C-Section, Low Transverse  Anticipated Birth Control:  PP IUD placed   01/04/2023 Vonna Drafts,  MD

## 2023-01-02 NOTE — Anesthesia Preprocedure Evaluation (Addendum)
Anesthesia Evaluation  Patient identified by MRN, date of birth, ID band Patient awake    Reviewed: Allergy & Precautions, H&P , NPO status , Patient's Chart, lab work & pertinent test results, reviewed documented beta blocker date and time   History of Anesthesia Complications Negative for: history of anesthetic complications  Airway Mallampati: III  TM Distance: >3 FB Neck ROM: full    Dental no notable dental hx. (+) Teeth Intact, Dental Advisory Given   Pulmonary asthma , former smoker   Pulmonary exam normal breath sounds clear to auscultation       Cardiovascular hypertension, Normal cardiovascular exam Rhythm:regular Rate:Normal     Neuro/Psych  PSYCHIATRIC DISORDERS Anxiety Depression Bipolar Disorder   negative neurological ROS     GI/Hepatic negative GI ROS, Neg liver ROS,,,  Endo/Other    Morbid obesity  Renal/GU negative Renal ROS  negative genitourinary   Musculoskeletal   Abdominal  (+) + obese  Peds  Hematology negative hematology ROS (+)   Anesthesia Other Findings   Reproductive/Obstetrics (+) Pregnancy                             Anesthesia Physical Anesthesia Plan  ASA: 3 and emergent  Anesthesia Plan: Epidural   Post-op Pain Management: Ofirmev IV (intra-op)*   Induction:   PONV Risk Score and Plan: 3 and Treatment may vary due to age or medical condition, Ondansetron and Dexamethasone  Airway Management Planned: Natural Airway  Additional Equipment: None  Intra-op Plan:   Post-operative Plan:   Informed Consent: I have reviewed the patients History and Physical, chart, labs and discussed the procedure including the risks, benefits and alternatives for the proposed anesthesia with the patient or authorized representative who has indicated his/her understanding and acceptance.     Dental Advisory Given  Plan Discussed with:   Anesthesia Plan  Comments: (Labs checked- platelets confirmed with RN in room. Fetal heart tracing, per RN, reported to be stable enough for sitting procedure. Discussed epidural, and patient consents to the procedure:  included risk of possible headache,backache, failed block, allergic reaction, and nerve injury. This patient was asked if she had any questions or concerns before the procedure started.  Labor epidural to be used for urgent C/S (NRFHT). Stephannie Peters, MD)        Anesthesia Quick Evaluation

## 2023-01-02 NOTE — Progress Notes (Signed)
Brandy Ruiz is a 32 y.o. G3P1011 at [redacted]w[redacted]d by ultrasound admitted for induction of labor due to Hypertension.  Subjective: Extremely uncomfortable with contractions.   Objective: BP (!) 120/90   Pulse (!) 116   Temp 99 F (37.2 C) (Oral)   Resp 18   Ht 5\' 5"  (1.651 m)   Wt 123.4 kg   LMP 03/23/2022 (Exact Date)   SpO2 99%   BMI 45.27 kg/m  I/O last 3 completed shifts: In: -  Out: 100 [Urine:100] No intake/output data recorded.  FHT:  FHR: 200 bpm, variability: moderate,  accelerations:  Abscent,  decelerations:  Present variable decelerations with each contraction UC:   regular, every 2-3 minutes SVE:   Dilation: 10 Effacement (%): 100 Station: 0 Exam by:: A.Hairston,RN  Labs: Lab Results  Component Value Date   WBC 9.4 01/01/2023   HGB 11.0 (L) 01/01/2023   HCT 33.3 (L) 01/01/2023   MCV 83.0 01/01/2023   PLT 185 01/01/2023   Patient Vitals for the past 24 hrs:  BP Temp Temp src Pulse Resp SpO2 Height Weight  01/02/23 0934 (!) 120/90 99 F (37.2 C) Oral (!) 116 18 -- -- --  01/02/23 0700 124/62 -- -- 94 -- -- -- --  01/02/23 0630 -- 98.8 F (37.1 C) Oral -- -- -- -- --  01/02/23 0600 136/81 -- -- 99 -- -- -- --  01/02/23 0530 134/72 -- -- 97 -- -- -- --  01/02/23 0515 (!) 148/64 -- -- 92 -- -- -- --  01/02/23 0500 (!) 122/59 -- -- (!) 106 -- -- -- --  01/02/23 0445 (!) 127/58 -- -- 99 -- -- -- --  01/02/23 0430 124/60 -- -- 96 -- -- -- --  01/02/23 0424 125/72 -- -- 98 -- -- -- --  01/02/23 0415 (!) 114/55 -- -- 99 -- -- -- --  01/02/23 0400 124/60 -- -- 94 -- -- -- --  01/02/23 0359 -- 98.4 F (36.9 C) Oral -- -- -- -- --  01/02/23 0345 133/71 -- -- 88 -- -- -- --  01/02/23 0330 138/69 -- -- 94 -- -- -- --  01/02/23 0315 138/68 -- -- 83 -- -- -- --  01/02/23 0300 (!) 121/98 -- -- -- -- -- -- --  01/02/23 0245 127/68 -- -- 96 -- -- -- --  01/02/23 0215 129/65 -- -- (!) 101 -- 99 % -- --  01/02/23 0200 (!) 122/53 -- -- (!) 102 -- 98 % -- --   01/02/23 0155 (!) 120/58 -- -- 100 -- 99 % -- --  01/02/23 0150 128/63 -- -- 94 -- 99 % -- --  01/02/23 0145 107/70 -- -- 91 -- 99 % -- --  01/02/23 0135 (!) 120/53 -- -- 96 -- 100 % -- --  01/02/23 0130 (!) 130/48 -- -- 86 -- 100 % -- --  01/02/23 0125 (!) 113/47 -- -- 90 -- 100 % -- --  01/02/23 0120 (!) 116/47 -- -- 92 -- 100 % -- --  01/02/23 0115 (!) 115/50 -- -- 91 -- 100 % -- --  01/02/23 0100 (!) 105/52 -- -- (!) 103 -- 100 % -- --  01/02/23 0055 (!) 151/124 -- -- 98 -- 100 % -- --  01/02/23 0050 (!) 142/74 -- -- 98 -- 100 % -- --  01/02/23 0045 129/80 -- -- (!) 104 -- 99 % -- --  01/02/23 0040 (!) 130/59 -- -- (!) 103 -- 100 % -- --  01/02/23  0035 (!) 156/49 -- -- 96 -- 100 % -- --  01/02/23 0030 125/64 -- -- 93 -- 99 % -- --  01/02/23 0025 (!) 159/96 -- -- 92 -- 99 % -- --  01/01/23 2320 135/64 -- -- (!) 101 -- -- -- --  01/01/23 2145 (!) 143/78 -- -- 92 -- -- -- --  01/01/23 2015 (!) 141/76 98.5 F (36.9 C) Oral 88 -- -- -- --  01/01/23 1932 -- -- -- -- -- -- 5\' 5"  (1.651 m) 123.4 kg  01/01/23 1341 130/60 -- -- 82 -- -- -- --  01/01/23 1151 105/70 -- -- 81 -- -- -- --     Assessment / Plan: Induction of labor due to cHTN,  progressing well on pitocin. Patient complete and 0 station. CNM to patient bedside to assess pushing. Patient denies feeling pressure with contractions. On exam anterior fontanel noted to be ROP.   Labor: Fetal inolerance with contractions. Increased baseline to 200 bpm with decreased variability. Little movement of station with contractions. Discussed with attending to come evaluate. Discuss cesarean delivery with patient and will proceed.  Preeclampsia:  labs stable, BPs normotensive  Fetal Wellbeing:  Category II Pain Control:  Epidural I/D:   GBS negative  Anticipated MOD:  cesarean delivery    The risks of cesarean section discussed with the patient included but were not limited to: bleeding which may require transfusion or reoperation;  infection which may require antibiotics; injury to bowel, bladder, ureters or other surrounding organs; injury to the fetus; need for additional procedures including hysterectomy in the event of a life-threatening hemorrhage; placental abnormalities with subsequent pregnancies, incisional problems, thromboembolic phenomenon and other postoperative/anesthesia complications. The patient concurred with the proposed plan, giving informed written consent for the procedure. Patient NPO status waived given urgency of case. Anesthesia and OR aware. Preoperative prophylactic antibiotics and SCDs ordered on call to the OR.    Celedonio Savage, MD 01/02/2023, 10:10 AM

## 2023-01-02 NOTE — Progress Notes (Signed)
Late Entry d/t patient acuity on the L&D floor.   Brandy Ruiz is a 32 y.o. G3P1011 at [redacted]w[redacted]d by ultrasound admitted for induction of labor due to Chronic Hypertension.  Subjective: Patient nauseated from epidural placement and decrease in BP suddenly. Patient actively vomiting, but states she barely feels contractions.   Objective: BP 134/72   Pulse 97   Temp 98.4 F (36.9 C) (Oral)   Resp 19   Ht 5\' 5"  (1.651 m)   Wt 123.4 kg   LMP 03/23/2022 (Exact Date)   SpO2 99%   BMI 45.27 kg/m  No intake/output data recorded. Total I/O In: -  Out: 100 [Urine:100]  FHT:  FHR: 140 bpm, variability: moderate,  accelerations:  Abscent,  decelerations:  Present Variables and occasional lates with quick return to baseline  UC:   regular, every 2-3 minutes SVE:   Dilation: 6.5 Effacement (%): 90 Station: -1 Exam by:: Rhunette Croft CNM   Labs: Lab Results  Component Value Date   WBC 9.4 01/01/2023   HGB 11.0 (L) 01/01/2023   HCT 33.3 (L) 01/01/2023   MCV 83.0 01/01/2023   PLT 185 01/01/2023  Patient Vitals for the past 24 hrs:  BP Temp Temp src Pulse Resp SpO2 Height Weight  01/02/23 0530 134/72 -- -- 97 -- -- -- --  01/02/23 0500 (!) 122/59 -- -- (!) 106 -- -- -- --  01/02/23 0445 (!) 127/58 -- -- 99 -- -- -- --  01/02/23 0430 124/60 -- -- 96 -- -- -- --  01/02/23 0424 125/72 -- -- 98 -- -- -- --  01/02/23 0415 (!) 114/55 -- -- 99 -- -- -- --  01/02/23 0400 124/60 -- -- 94 -- -- -- --  01/02/23 0359 -- 98.4 F (36.9 C) Oral -- -- -- -- --  01/02/23 0345 133/71 -- -- 88 -- -- -- --  01/02/23 0330 138/69 -- -- 94 -- -- -- --  01/02/23 0315 138/68 -- -- 83 -- -- -- --  01/02/23 0300 (!) 121/98 -- -- -- -- -- -- --  01/02/23 0245 127/68 -- -- 96 -- -- -- --  01/02/23 0215 129/65 -- -- (!) 101 -- 99 % -- --  01/02/23 0200 (!) 122/53 -- -- (!) 102 -- 98 % -- --  01/02/23 0155 (!) 120/58 -- -- 100 -- 99 % -- --  01/02/23 0150 128/63 -- -- 94 -- 99 % -- --  01/02/23 0145 107/70 --  -- 91 -- 99 % -- --  01/02/23 0135 (!) 120/53 -- -- 96 -- 100 % -- --  01/02/23 0130 (!) 130/48 -- -- 86 -- 100 % -- --  01/02/23 0125 (!) 113/47 -- -- 90 -- 100 % -- --  01/02/23 0120 (!) 116/47 -- -- 92 -- 100 % -- --  01/02/23 0115 (!) 115/50 -- -- 91 -- 100 % -- --  01/02/23 0100 (!) 105/52 -- -- (!) 103 -- 100 % -- --  01/02/23 0055 (!) 151/124 -- -- 98 -- 100 % -- --  01/02/23 0050 (!) 142/74 -- -- 98 -- 100 % -- --  01/02/23 0045 129/80 -- -- (!) 104 -- 99 % -- --  01/02/23 0040 (!) 130/59 -- -- (!) 103 -- 100 % -- --  01/02/23 0035 (!) 156/49 -- -- 96 -- 100 % -- --  01/02/23 0030 125/64 -- -- 93 -- 99 % -- --  01/02/23 0025 (!) 159/96 -- -- 92 -- 99 % -- --  01/01/23 2320 135/64 -- -- (!) 101 -- -- -- --  01/01/23 2145 (!) 143/78 -- -- 92 -- -- -- --  01/01/23 2015 (!) 141/76 98.5 F (36.9 C) Oral 88 -- -- -- --  01/01/23 1932 -- -- -- -- -- -- 5\' 5"  (1.651 m) 123.4 kg  01/01/23 1341 130/60 -- -- 82 -- -- -- --  01/01/23 1151 105/70 -- -- 81 -- -- -- --  01/01/23 0852 -- -- -- -- -- -- 5\' 5"  (1.651 m) --  01/01/23 0829 124/68 98.7 F (37.1 C) Oral 92 19 100 % -- --     Assessment / Plan: Induction of labor due to chronic hypertension,  progressing well on pitocin.   Labor: Given variable decels and difficulty tracing contractions, IUPC ordered. Discussed risks and benefits with patient and through shared decision making patient agrees to proceed with IUPC placement. Placement successful on first attempt. FHT Cat II prior to placement and remained Cat II following placement.  Preeclampsia:  labs stable. Patient received 1 dose of ephedrine following epidural placement. BPs stabilized.   Fetal Wellbeing:  Category II- continuous monitoring following epidural placement  Pain Control:  Epidural I/D:   GBS negative  Anticipated MOD:  NSVD  Claudette Head, CNM 01/02/2023, 5:58 AM

## 2023-01-02 NOTE — Anesthesia Procedure Notes (Signed)
Epidural Patient location during procedure: OB Start time: 01/02/2023 12:18 AM End time: 01/02/2023 12:32 AM  Staffing Anesthesiologist: Lowella Curb, MD Performed: anesthesiologist   Preanesthetic Checklist Completed: patient identified, IV checked, site marked, risks and benefits discussed, surgical consent, monitors and equipment checked, pre-op evaluation and timeout performed  Epidural Patient position: sitting Prep: ChloraPrep Patient monitoring: heart rate, cardiac monitor, continuous pulse ox and blood pressure Approach: midline Location: L2-L3 Injection technique: LOR saline  Needle:  Needle type: Tuohy  Needle gauge: 17 G Needle length: 9 cm Needle insertion depth: 6 cm Catheter type: closed end flexible Catheter size: 20 Guage Catheter at skin depth: 10 cm Test dose: negative  Assessment Events: blood not aspirated, injection not painful, no injection resistance, no paresthesia and negative IV test  Additional Notes Reason for block:procedure for pain

## 2023-01-02 NOTE — Lactation Note (Signed)
This note was copied from a baby's chart. Lactation Consultation Note  Patient Name: Brandy Ruiz [id number] Date: 01/02/2023 Age:31 hours   Mom chooses to formula feed.  Maternal Data    Feeding Nipple Type: Slow - flow  LATCH Score                    Lactation Tools Discussed/Used    Interventions    Discharge    Consult Status Consult Status: Complete    Jsoeph Podesta G 01/02/2023, 7:10 PM

## 2023-01-02 NOTE — Progress Notes (Signed)
After Caesarean late entry note:  I was called to room about 0940 for deep variables  I reviewed FHR strip, the variables began about 30 minutes prior and had good return and variability Now just  starting to be a little deeper and with  tachycaria as well  I did a vaginal exam and BPD at +1, large caput ROP I tried a manual rotation but the head moved minimallyu and spun right back  I discussed with the patient and her family the current situation  Estimated fetal weight is greater than 99th percentile so I am not comfortable with any sort of vaginal instrument of delivery at this station The fetal vertex is ROP and does not respond to manual rotation Fetal heart rate tracing is presently not decompensated, currently at 0 950 it represented appropriately compensated deep variable decelerations but certainly with this increased CO2 state and the subtle changes that are now taking place the fetal heart rate tracing this will not last for a long I have recommended proceeding with a primary cesarean section to affect delivery because I think vaginal delivery is going to be an hour and a half or so from now under the best of circumstances and the baby would would not tolerate this for that long Patient was understandably very upset with this recommendation and current situation so I gave her a few minutes to talk it over with her family I came back in and they were all in agreement that we would proceed with a primary low-transverse cesarean section I answered the questions of the patient and the family and we made the definitive call just a little bit before 10 AM  Patient is latex allergic and we had to open a latex allergic set up Between the time I called the C-section and the time she was brought back same pattern continued but now with minimal variability, some baseline overshoots and post deceleration shoulders all pointing toward baby to worsening respiratory acidosis  At the time of  cesarean there was no nuchal cord and no evidence that the cord was compromised There was no evidence of abruption  Cord gas was arterial:ph 6.97/pCO2 87/HCO3 20 This gas represents respiratory acidosis with a very small drop in the bicarb reflecting early next But with a pCO2 of 87 this was as expected early up you were respiratory acidosis  Apgars were 2 3 and 7 baby was taken up to the NICU for initial evaluation over the next couple hours But was being held by the father and had some contact with mother prior to be taken to the NICU  Please see the op note for additional intraoperative details  Lazaro Arms, MD 01/02/2023 11:45 AM

## 2023-01-02 NOTE — Transfer of Care (Signed)
Immediate Anesthesia Transfer of Care Note  Patient: Brandy Ruiz  Procedure(s) Performed: CESAREAN SECTION  Patient Location: PACU  Anesthesia Type:Epidural  Level of Consciousness: awake, alert , and oriented  Airway & Oxygen Therapy: Patient Spontanous Breathing  Post-op Assessment: Report given to RN and Post -op Vital signs reviewed and stable  Post vital signs: Reviewed and stable  Last Vitals:  Vitals Value Taken Time  BP 118/100 01/02/23 1154  Temp    Pulse 84 01/02/23 1158  Resp 14 01/02/23 1158  SpO2 100 % 01/02/23 1158  Vitals shown include unvalidated device data.  Last Pain:  Vitals:   01/02/23 0934  TempSrc: Oral  PainSc:          Complications: No notable events documented.

## 2023-01-02 NOTE — Progress Notes (Signed)
Brandy Ruiz is a 32 y.o. G3P1011 at [redacted]w[redacted]d by ultrasound admitted for induction of labor due to Hypertension.  Subjective: Patient doing well. Very comfortable with epidural.   Objective: BP 124/62   Pulse 94   Temp 98.8 F (37.1 C) (Oral)   Resp 19   Ht 5\' 5"  (1.651 m)   Wt 123.4 kg   LMP 03/23/2022 (Exact Date)   SpO2 99%   BMI 45.27 kg/m  I/O last 3 completed shifts: In: -  Out: 100 [Urine:100] No intake/output data recorded.  FHT:  FHR: 150 bpm, variability: moderate,  accelerations:  Abscent,  decelerations:  Present Variables with occasional lates. Lates resolve with position changes.  UC:   regular, every 2-3 minutes SVE:   Dilation: 10 Effacement (%): 100 Station: 0 Exam by:: A.Hairston,RN  Labs: Lab Results  Component Value Date   WBC 9.4 01/01/2023   HGB 11.0 (L) 01/01/2023   HCT 33.3 (L) 01/01/2023   MCV 83.0 01/01/2023   PLT 185 01/01/2023   Patient Vitals for the past 24 hrs:  BP Temp Temp src Pulse Resp SpO2 Height Weight  01/02/23 0700 124/62 -- -- 94 -- -- -- --  01/02/23 0630 -- 98.8 F (37.1 C) Oral -- -- -- -- --  01/02/23 0600 136/81 -- -- 99 -- -- -- --  01/02/23 0530 134/72 -- -- 97 -- -- -- --  01/02/23 0515 (!) 148/64 -- -- 92 -- -- -- --  01/02/23 0500 (!) 122/59 -- -- (!) 106 -- -- -- --  01/02/23 0445 (!) 127/58 -- -- 99 -- -- -- --  01/02/23 0430 124/60 -- -- 96 -- -- -- --  01/02/23 0424 125/72 -- -- 98 -- -- -- --  01/02/23 0415 (!) 114/55 -- -- 99 -- -- -- --  01/02/23 0400 124/60 -- -- 94 -- -- -- --  01/02/23 0359 -- 98.4 F (36.9 C) Oral -- -- -- -- --  01/02/23 0345 133/71 -- -- 88 -- -- -- --  01/02/23 0330 138/69 -- -- 94 -- -- -- --  01/02/23 0315 138/68 -- -- 83 -- -- -- --  01/02/23 0300 (!) 121/98 -- -- -- -- -- -- --  01/02/23 0245 127/68 -- -- 96 -- -- -- --  01/02/23 0215 129/65 -- -- (!) 101 -- 99 % -- --  01/02/23 0200 (!) 122/53 -- -- (!) 102 -- 98 % -- --  01/02/23 0155 (!) 120/58 -- -- 100 -- 99 % --  --  01/02/23 0150 128/63 -- -- 94 -- 99 % -- --  01/02/23 0145 107/70 -- -- 91 -- 99 % -- --  01/02/23 0135 (!) 120/53 -- -- 96 -- 100 % -- --  01/02/23 0130 (!) 130/48 -- -- 86 -- 100 % -- --  01/02/23 0125 (!) 113/47 -- -- 90 -- 100 % -- --  01/02/23 0120 (!) 116/47 -- -- 92 -- 100 % -- --  01/02/23 0115 (!) 115/50 -- -- 91 -- 100 % -- --  01/02/23 0100 (!) 105/52 -- -- (!) 103 -- 100 % -- --  01/02/23 0055 (!) 151/124 -- -- 98 -- 100 % -- --  01/02/23 0050 (!) 142/74 -- -- 98 -- 100 % -- --  01/02/23 0045 129/80 -- -- (!) 104 -- 99 % -- --  01/02/23 0040 (!) 130/59 -- -- (!) 103 -- 100 % -- --  01/02/23 0035 (!) 156/49 -- -- 96 -- 100 % -- --  01/02/23 0030 125/64 -- -- 93 -- 99 % -- --  01/02/23 0025 (!) 159/96 -- -- 92 -- 99 % -- --  01/01/23 2320 135/64 -- -- (!) 101 -- -- -- --  01/01/23 2145 (!) 143/78 -- -- 92 -- -- -- --  01/01/23 2015 (!) 141/76 98.5 F (36.9 C) Oral 88 -- -- -- --  01/01/23 1932 -- -- -- -- -- -- 5\' 5"  (1.651 m) 123.4 kg  01/01/23 1341 130/60 -- -- 82 -- -- -- --  01/01/23 1151 105/70 -- -- 81 -- -- -- --  01/01/23 0852 -- -- -- -- -- -- 5\' 5"  (1.651 m) --  01/01/23 0829 124/68 98.7 F (37.1 C) Oral 92 19 100 % -- --    Assessment / Plan: Induction of labor due to cHTN,  progressing well on pitocin. Patient complete and 0 station. CNM to patient bedside to assess pushing. Patient denies feeling pressure with contractions. On exam anterior fontanel noted to be ROP.   Labor:  Paitent progressed to Complete. Patient repositioned to exaggerated  sims in attempt to rotate head to OA position.   Preeclampsia:  labs stable, BPs normotensive  Fetal Wellbeing:  Category II Pain Control:  Epidural I/D:   GBS negative  Anticipated MOD:  NSVD  Claudette Head, CNM 01/02/2023, 7:23 AM

## 2023-01-02 NOTE — Plan of Care (Signed)

## 2023-01-03 LAB — CBC
HCT: 28.3 % — ABNORMAL LOW (ref 36.0–46.0)
Hemoglobin: 9.2 g/dL — ABNORMAL LOW (ref 12.0–15.0)
MCH: 27.3 pg (ref 26.0–34.0)
MCHC: 32.5 g/dL (ref 30.0–36.0)
MCV: 84 fL (ref 80.0–100.0)
Platelets: 143 10*3/uL — ABNORMAL LOW (ref 150–400)
RBC: 3.37 MIL/uL — ABNORMAL LOW (ref 3.87–5.11)
RDW: 14.6 % (ref 11.5–15.5)
WBC: 17.4 10*3/uL — ABNORMAL HIGH (ref 4.0–10.5)
nRBC: 0 % (ref 0.0–0.2)

## 2023-01-03 LAB — RH IG WORKUP (INCLUDES ABO/RH): Gestational Age(Wks): 39.3

## 2023-01-03 LAB — KLEIHAUER-BETKE STAIN
# Vials RhIg: 1
Fetal Cells %: 0 %
Quantitation Fetal Hemoglobin: 0 mL

## 2023-01-03 MED ORDER — PNEUMOCOCCAL 20-VAL CONJ VACC 0.5 ML IM SUSY
0.5000 mL | PREFILLED_SYRINGE | INTRAMUSCULAR | Status: AC
  Administered 2023-01-04: 0.5 mL via INTRAMUSCULAR
  Filled 2023-01-03: qty 0.5

## 2023-01-03 MED ORDER — RHO D IMMUNE GLOBULIN 1500 UNIT/2ML IJ SOSY
300.0000 ug | PREFILLED_SYRINGE | Freq: Once | INTRAMUSCULAR | Status: AC
  Administered 2023-01-03: 300 ug via INTRAVENOUS
  Filled 2023-01-03: qty 2

## 2023-01-03 MED ORDER — RHO D IMMUNE GLOBULIN 1500 UNITS IM SOSY
300.0000 ug | PREFILLED_SYRINGE | Freq: Once | INTRAMUSCULAR | Status: DC
  Filled 2023-01-03: qty 1

## 2023-01-03 NOTE — Lactation Note (Signed)
This note was copied from a baby's chart. Lactation Consultation Note  Patient Name: Brandy Ruiz ZOXWR'U Date: 01/03/2023 Age:31 hours   Mom reported to RN that she originally stated she didn't want to see Lactation but she does want to see Lactation because mom wants to pump and bottle feed as well as formula feed.  Maternal Data    Feeding Nipple Type: Slow - flow  LATCH Score                    Lactation Tools Discussed/Used    Interventions    Discharge    Consult Status      Charyl Dancer 01/03/2023, 7:21 AM
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
## 2023-01-03 NOTE — Progress Notes (Signed)
Subjective: Postpartum Day 1: Cesarean Delivery Patient reports tolerating PO and + flatus.  Due to void  Objective: Vital signs in last 24 hours: Temp:  [98.4 F (36.9 C)-99.3 F (37.4 C)] 98.5 F (36.9 C) (04/27 1700) Pulse Rate:  [77-116] 86 (04/28 0615) Resp:  [12-22] 17 (04/28 0615) BP: (118-143)/(55-100) 132/66 (04/28 0615) SpO2:  [98 %-100 %] 99 % (04/28 0615)  Physical Exam:  General: alert, cooperative, and no distress Lochia: appropriate Uterine Fundus: firm Incision: healing well, no significant drainage, no dehiscence, no significant erythema DVT Evaluation: No evidence of DVT seen on physical exam. Negative Homan's sign. No cords or calf tenderness. No significant calf/ankle edema.  Recent Labs    01/01/23 0822 01/03/23 0414  HGB 11.0* 9.2*  HCT 33.3* 28.3*    Assessment/Plan: Status post Cesarean section. Doing well postoperatively.  Continue current care CHTN: stable, Lasix ordered Anticipate discharge tomorrow  Donette Larry, CNM 01/03/2023, 8:09 AM

## 2023-01-03 NOTE — Lactation Note (Signed)
This note was copied from a baby's chart. Lactation Consultation Note  Patient Name: Brandy Ruiz ZOXWR'U Date: 01/03/2023 Age:31 hours Reason for consult: Follow-up assessment  P2, Mother stated she struggled with breastfeeding with first child due to tongue tie.  Revision was made at 3 mos. And by then she had stopped breastfeeding. She wants to breastfeed and formula feed this child. Encouraged offering breast before formula.   Assisted with latching in both cross cradle and football holds. Baby latched off and on in football hold.  Provided mother with hand pump to prepump if needed. Reviewed engorgement care.  Maternal Data Has patient been taught Hand Expression?: Yes  Feeding Mother's Current Feeding Choice: Breast Milk and Formula  LATCH Score Latch: Repeated attempts needed to sustain latch, nipple held in mouth throughout feeding, stimulation needed to elicit sucking reflex.  Audible Swallowing: A few with stimulation  Type of Nipple: Everted at rest and after stimulation  Comfort (Breast/Nipple): Soft / non-tender  Hold (Positioning): Assistance needed to correctly position infant at breast and maintain latch.  LATCH Score: 7   Lactation Tools Discussed/Used  Manual pump  Interventions Interventions: Breast feeding basics reviewed;Assisted with latch;Skin to skin;Hand express;Pre-pump if needed;Hand pump;Education  Discharge Pump: Hands Free;Personal  Consult Status Consult Status: Complete    Hardie Pulley 01/03/2023, 9:37 AM

## 2023-01-04 ENCOUNTER — Other Ambulatory Visit (HOSPITAL_COMMUNITY): Payer: Self-pay

## 2023-01-04 LAB — RH IG WORKUP (INCLUDES ABO/RH)
Fetal Screen: POSITIVE
Unit division: 0
Weak D: NEGATIVE

## 2023-01-04 MED ORDER — IBUPROFEN 600 MG PO TABS
600.0000 mg | ORAL_TABLET | Freq: Four times a day (QID) | ORAL | 0 refills | Status: DC
  Filled 2023-01-04: qty 30, 8d supply, fill #0

## 2023-01-04 MED ORDER — FUROSEMIDE 20 MG PO TABS
20.0000 mg | ORAL_TABLET | Freq: Every day | ORAL | 0 refills | Status: DC
  Filled 2023-01-04: qty 5, 5d supply, fill #0

## 2023-01-04 MED ORDER — OXYCODONE HCL 5 MG PO TABS
5.0000 mg | ORAL_TABLET | ORAL | 0 refills | Status: DC | PRN
  Filled 2023-01-04: qty 15, 3d supply, fill #0

## 2023-01-04 MED ORDER — FERROUS SULFATE 325 (65 FE) MG PO TABS
325.0000 mg | ORAL_TABLET | Freq: Every day | ORAL | Status: DC
  Administered 2023-01-04: 325 mg via ORAL
  Filled 2023-01-04 (×2): qty 1

## 2023-01-04 MED ORDER — ACETAMINOPHEN 500 MG PO TABS
1000.0000 mg | ORAL_TABLET | Freq: Four times a day (QID) | ORAL | 0 refills | Status: DC
  Filled 2023-01-04: qty 30, 4d supply, fill #0

## 2023-01-04 MED ORDER — FERROUS SULFATE 325 (65 FE) MG PO TABS
325.0000 mg | ORAL_TABLET | Freq: Every day | ORAL | 0 refills | Status: DC
  Filled 2023-01-04: qty 30, 30d supply, fill #0

## 2023-01-04 NOTE — Progress Notes (Signed)
POSTPARTUM PROGRESS NOTE  POD #2  Subjective:  Brandy Ruiz is a 32 y.o. [id number] s/p pLTCS at [redacted]w[redacted]d.  She reports she doing well. No acute events overnight. She reports she is doing well. She denies any problems with ambulating, voiding or po intake. Denies nausea or vomiting. She has passed flatus. Pain is well controlled.  Lochia is appropriate.  Objective: Blood pressure 129/67, pulse 92, temperature 98.8 F (37.1 C), temperature source Oral, resp. rate 18, height 5\' 5"  (1.651 m), weight 123.4 kg, last menstrual period 03/23/2022, SpO2 100 %, unknown if currently breastfeeding.  Physical Exam:  General: alert, cooperative and no distress Chest: no respiratory distress Heart:regular rate, distal pulses intact Abdomen: soft, nontender,  Uterine Fundus: firm, appropriately tender DVT Evaluation: No calf swelling or tenderness Extremities: Mild non-pitting LE edema Skin: warm, dry; incision clean/dry/intact w/ honeycomb dressing in place  Recent Labs    01/01/23 0822 01/03/23 0414  HGB 11.0* 9.2*  HCT 33.3* 28.3*    Assessment/Plan: Brandy Ruiz is a 32 y.o. [id number] s/p pLTCS at [redacted]w[redacted]d for NRFHT.  POD#2 - Doing well; pain well controlled. H/H appropriate  Routine postpartum care  OOB, ambulated  Lovenox for VTE prophylaxis Anemia: Start po ferrous sulfate daily Contraception: ppIUD Feeding: breast  Dispo: Plan for discharge 4/29-4/30.   LOS: 3 days   Lavonda Jumbo, DO OB Fellow, Faculty [facility], Center for [facility] 01/04/2023, 6:38 AM

## 2023-01-04 NOTE — Lactation Note (Signed)
This note was copied from a baby's chart. Lactation Consultation Note  Patient Name: Brandy Ruiz ZOXWR'U Date: 01/04/2023 Age:30 hours Reason for consult: Follow-up assessment;Term (per mom baby in the nursery for a circ .) Per mom latching for a short time since the baby is gotten use to the bottle . ]per mom has a DEBP hand free pump at home.  LC recommended when she goes home to keep working on latching and try to give and appetizer of EBM or formula from the bottle and then latch . And post pump to enhance the milk coming in.  [facility] offered mom and LC O/P and mom declined and per mom plan is to obtain assistance of the Family connections for lactation support. Mom has the handout with phone numbers for [facility] O/P if needed.  LC reviewed BF D/C teaching and LC resources.   Maternal Data    Feeding Mother's Current Feeding Choice: Breast Milk and Formula  LATCH Score   Lactation Tools Discussed/Used Breast pump type: Manual Pump Education: Milk Storage  Interventions Interventions: Breast feeding basics reviewed;Education;Position options;Hand pump  Discharge Discharge Education: Engorgement and breast care;Warning signs for feeding baby;Outpatient recommendation;Other (comment) (mom declined LC O/P referral) Pump: Hands Free;Personal  Consult Status Consult Status: Complete    Kathrin Greathouse 01/04/2023, 2:29 PM

## 2023-01-05 LAB — SURGICAL PATHOLOGY

## 2023-01-12 ENCOUNTER — Ambulatory Visit (INDEPENDENT_AMBULATORY_CARE_PROVIDER_SITE_OTHER): Payer: PRIVATE HEALTH INSURANCE

## 2023-01-12 VITALS — BP 123/82 | HR 87 | Ht 65.0 in | Wt 249.2 lb

## 2023-01-12 DIAGNOSIS — Z1339 Encounter for screening examination for other mental health and behavioral disorders: Secondary | ICD-10-CM

## 2023-01-12 DIAGNOSIS — Z5189 Encounter for other specified aftercare: Secondary | ICD-10-CM

## 2023-01-12 NOTE — Progress Notes (Signed)
The wound is cleansed, debrided of foreign material as much as possible, and dressed. The patient is alerted to watch for any signs of infection (redness, pus, pain, increased swelling or fever) and call if such occurs. Home wound care instructions are provided. Tetanus vaccination status reviewed: last tetanus booster within 10 years.   Patient presents for postpartum wound check. Patient has removed honeycomb dressing. Wound appears clean, dry, and intact. Dermabond glue is visible and peeling. No signs of infection seen. Reviewed signs of infection with patient and daily wound care when showering. Patient verbalized understanding.

## 2023-01-17 ENCOUNTER — Encounter (HOSPITAL_COMMUNITY): Payer: Self-pay | Admitting: Family Medicine

## 2023-01-17 NOTE — Progress Notes (Signed)
Received phone call from nurse tech on L&D about open addendum on delivery summary under this RN's name from 01/02/23. This RN, acting as Consulting civil engineer that day, opened an addendum in order to enter baby's measurements upon baby's arrival to the NICU. This RN signed addendum 01/17/23.

## 2023-02-12 ENCOUNTER — Ambulatory Visit (INDEPENDENT_AMBULATORY_CARE_PROVIDER_SITE_OTHER): Payer: PRIVATE HEALTH INSURANCE | Admitting: Obstetrics and Gynecology

## 2023-02-12 ENCOUNTER — Encounter: Payer: Self-pay | Admitting: Obstetrics and Gynecology

## 2023-02-12 NOTE — Progress Notes (Signed)
Post Partum Visit Note  Brandy Ruiz is a 32 y.o. G35P2012 female who presents for a postpartum visit. She is 6 weeks postpartum following a primary cesarean section.  I have fully reviewed the prenatal and intrapartum course. The delivery was at 39.3 gestational weeks.  Anesthesia: epidural. Postpartum course has been uncomplicated. Baby is doing well. Baby is feeding by bottle - Similac Advance. Bleeding staining only. Bowel function is normal. Bladder function is normal. Patient is not sexually active. Contraception method is IUD.  Postpartum depression screening: negative, score 1.     Edinburgh Postnatal Depression Scale - 02/12/23 0949       Edinburgh Postnatal Depression Scale:  In the Past 7 Days   I have been able to laugh and see the funny side of things. 0    I have looked forward with enjoyment to things. 0    I have blamed myself unnecessarily when things went wrong. 1    I have been anxious or worried for no good reason. 0    I have felt scared or panicky for no good reason. 0    Things have been getting on top of me. 0    I have been so unhappy that I have had difficulty sleeping. 0    I have felt sad or miserable. 0    I have been so unhappy that I have been crying. 0    The thought of harming myself has occurred to me. 0    Edinburgh Postnatal Depression Scale Total 1             Health Maintenance Due  Topic Date Due   COVID-19 Vaccine (1) Never done   DTaP/Tdap/Td (1 - Tdap) Never done       Review of Systems Pertinent items noted in HPI and remainder of comprehensive ROS otherwise negative.  Objective:  BP (!) 131/91   Pulse 83   Wt 248 lb (112.5 kg)   LMP 03/23/2022 (Exact Date)   BMI 41.27 kg/m    General:  alert, cooperative, and no distress   Breasts:  normal  Lungs: clear to auscultation bilaterally  Heart:  regular rate and rhythm, S1, S2 normal, no murmur, click, rub or gallop  Abdomen: soft, non-tender; bowel sounds normal; no  masses,  no organomegaly   Wound well approximated incision   GU exam:   Normal wit IUD strings visualized and trimmed to 2 cm       Assessment:    There are no diagnoses linked to this encounter.  Normal postpartum exam.   Plan:   Essential components of care per ACOG recommendations:  1.  Mood and well being: Patient with negative depression screening today. Reviewed local resources for support.  - Patient tobacco use? No.   - hx of drug use? No.    2. Infant care and feeding:  -Patient currently breastmilk feeding? No.  -Social determinants of health (SDOH) reviewed in EPIC. No concerns  3. Sexuality, contraception and birth spacing - Patient does not want a pregnancy in the next year.  Desired family size is 2 children.  - Reviewed reproductive life planning. Reviewed contraceptive methods based on pt preferences and effectiveness.   - Discussed birth spacing of 18 months  4. Sleep and fatigue -Encouraged family/partner/community support of 4 hrs of uninterrupted sleep to help with mood and fatigue  5. Physical Recovery  - Discussed patients delivery and complications. She describes her labor as good. - Patient has urinary  incontinence? No. - Patient is safe to resume physical and sexual activity  6.  Health Maintenance - HM due items addressed Yes - Last pap smear  Diagnosis  Date Value Ref Range Status  07/02/2022 (A)  Final   - Atypical squamous cells of undetermined significance (ASC-US)   Pap smear not done at today's visit.  -Breast Cancer screening indicated? No.   7. Chronic Disease/Pregnancy Condition follow up: None  - PCP follow up  Catalina Antigua, MD Center for [facility], [facility]

## 2023-02-27 ENCOUNTER — Other Ambulatory Visit (HOSPITAL_COMMUNITY): Payer: Self-pay

## 2023-05-15 ENCOUNTER — Encounter (HOSPITAL_COMMUNITY): Payer: Self-pay

## 2023-05-15 ENCOUNTER — Emergency Department (HOSPITAL_COMMUNITY): Payer: Self-pay

## 2023-05-15 ENCOUNTER — Observation Stay (HOSPITAL_COMMUNITY)
Admission: EM | Admit: 2023-05-15 | Discharge: 2023-05-16 | Disposition: A | Discharge status: Home / Self Care | Payer: Self-pay | Attending: Internal Medicine | Admitting: Internal Medicine

## 2023-05-15 ENCOUNTER — Other Ambulatory Visit: Payer: Self-pay

## 2023-05-15 DIAGNOSIS — Z91040 Latex allergy status: Secondary | ICD-10-CM | POA: Insufficient documentation

## 2023-05-15 DIAGNOSIS — J069 Acute upper respiratory infection, unspecified: Secondary | ICD-10-CM | POA: Insufficient documentation

## 2023-05-15 DIAGNOSIS — R7989 Other specified abnormal findings of blood chemistry: Secondary | ICD-10-CM

## 2023-05-15 DIAGNOSIS — Z1152 Encounter for screening for COVID-19: Secondary | ICD-10-CM | POA: Insufficient documentation

## 2023-05-15 DIAGNOSIS — B348 Other viral infections of unspecified site: Secondary | ICD-10-CM | POA: Insufficient documentation

## 2023-05-15 DIAGNOSIS — J45901 Unspecified asthma with (acute) exacerbation: Principal | ICD-10-CM | POA: Insufficient documentation

## 2023-05-15 DIAGNOSIS — F17210 Nicotine dependence, cigarettes, uncomplicated: Secondary | ICD-10-CM | POA: Insufficient documentation

## 2023-05-15 LAB — CBC
HCT: 42.5 % (ref 36.0–46.0)
Hemoglobin: 13.5 g/dL (ref 12.0–15.0)
MCH: 27.1 pg (ref 26.0–34.0)
MCHC: 31.8 g/dL (ref 30.0–36.0)
MCV: 85.3 fL (ref 80.0–100.0)
Platelets: 204 10*3/uL (ref 150–400)
RBC: 4.98 MIL/uL (ref 3.87–5.11)
RDW: 13.7 % (ref 11.5–15.5)
WBC: 9.3 10*3/uL (ref 4.0–10.5)
nRBC: 0 % (ref 0.0–0.2)

## 2023-05-15 LAB — RESP PANEL BY RT-PCR (RSV, FLU A&B, COVID)  RVPGX2
Influenza A by PCR: NEGATIVE
Influenza B by PCR: NEGATIVE
Resp Syncytial Virus by PCR: NEGATIVE
SARS Coronavirus 2 by RT PCR: NEGATIVE

## 2023-05-15 LAB — RESPIRATORY PANEL BY PCR

## 2023-05-15 LAB — COMPREHENSIVE METABOLIC PANEL
ALT: 25 U/L (ref 0–44)
AST: 25 U/L (ref 15–41)
Albumin: 3.9 g/dL (ref 3.5–5.0)
Alkaline Phosphatase: 65 U/L (ref 38–126)
Anion gap: 9 (ref 5–15)
BUN: 12 mg/dL (ref 6–20)
CO2: 26 mmol/L (ref 22–32)
Calcium: 8.8 mg/dL — ABNORMAL LOW (ref 8.9–10.3)
Chloride: 104 mmol/L (ref 98–111)
Creatinine, Ser: 0.88 mg/dL (ref 0.44–1.00)
GFR, Estimated: >60 mL/min (ref >60)
Glucose, Bld: 115 mg/dL — ABNORMAL HIGH (ref 70–99)
Potassium: 3.5 mmol/L (ref 3.5–5.1)
Sodium: 139 mmol/L (ref 135–145)
Total Bilirubin: 0.9 mg/dL (ref 0.3–1.2)
Total Protein: 7.3 g/dL (ref 6.5–8.1)

## 2023-05-15 LAB — D-DIMER, QUANTITATIVE: D-Dimer, Quant: 0.53 ug{FEU}/mL — ABNORMAL HIGH (ref 0.00–0.50)

## 2023-05-15 LAB — HCG, SERUM, QUALITATIVE: Preg, Serum: NEGATIVE

## 2023-05-15 MED ORDER — IPRATROPIUM-ALBUTEROL 0.5-2.5 (3) MG/3ML IN SOLN
3.0000 mL | RESPIRATORY_TRACT | Status: AC
  Administered 2023-05-15 (×3): 3 mL via RESPIRATORY_TRACT
  Filled 2023-05-15: qty 3
  Filled 2023-05-15: qty 6

## 2023-05-15 MED ORDER — MAGNESIUM SULFATE 2 GM/50ML IV SOLN
2.0000 g | Freq: Once | INTRAVENOUS | Status: AC
  Administered 2023-05-15: 2 g via INTRAVENOUS
  Filled 2023-05-15: qty 50

## 2023-05-15 MED ORDER — IOHEXOL 350 MG/ML SOLN: 75.0000 mL | Freq: Once | INTRAVENOUS | Status: DC | PRN

## 2023-05-15 MED ORDER — RIVAROXABAN 10 MG PO TABS
10.0000 mg | ORAL_TABLET | Freq: Every day | ORAL | Status: DC
  Administered 2023-05-16: 10 mg via ORAL
  Filled 2023-05-15: qty 1

## 2023-05-15 MED ORDER — METHYLPREDNISOLONE SODIUM SUCC 125 MG IJ SOLR
125.0000 mg | Freq: Once | INTRAMUSCULAR | Status: AC
  Administered 2023-05-15: 125 mg via INTRAVENOUS
  Filled 2023-05-15: qty 2

## 2023-05-15 MED ORDER — PREDNISONE 20 MG PO TABS
40.0000 mg | ORAL_TABLET | Freq: Every day | ORAL | Status: DC
  Administered 2023-05-16: 40 mg via ORAL
  Filled 2023-05-15: qty 2

## 2023-05-15 MED ORDER — IOHEXOL 350 MG/ML SOLN
150.0000 mL | Freq: Once | INTRAVENOUS | Status: AC | PRN
  Administered 2023-05-15: 150 mL via INTRAVENOUS

## 2023-05-15 MED ORDER — IPRATROPIUM-ALBUTEROL 0.5-2.5 (3) MG/3ML IN SOLN
3.0000 mL | Freq: Four times a day (QID) | RESPIRATORY_TRACT | Status: DC | PRN
  Administered 2023-05-15 – 2023-05-16 (×3): 3 mL via RESPIRATORY_TRACT
  Filled 2023-05-15 (×3): qty 3

## 2023-05-15 NOTE — H&P (Cosign Needed)
Date: 05/15/2023               Patient Name:  Brandy Ruiz MRN: [id number]  DOB: Jun 28, 1991 Age / Sex: 32 y.o., female   PCP: Pcp, No         Medical Service: Internal Medicine Teaching Service         Attending Physician: Dr. Dickie La, MD      First Contact: Katheran James, DO  [phone]    Second Contact: Modena Slater, DO  [phone]         After Hours (After 5p/  First Contact Pager: [phone]  weekends / holidays): Second Contact Pager: [phone]   SUBJECTIVE   Chief Complaint: SOB & Wheezing   History of Present Illness:   Ms. Brandy Ruiz is a 32 year old female with past medical history of bipolar disorder, anixety, depression, PTSD, asthma who presents with shortness of breath. She notes that Thursday evening, her 2 children became sick with an upper respiratory tract infection.  She says last time she felt this way was when she had RSV when she was pregnant.  Before going to bed on Thursday night, she had no signs or symptoms.  She woke up at 5 AM on Friday morning feeling short of breath with difficulty breathing and congestion.  She went to work later in the morning but her symptoms do not go away after albuterol and saline.  She was using albuterol every 4 hours but had persistent wheezing and shortness of breath.  When she is asymptomatic, she does not use her albuterol.  Denies any recent travel.  She has also tried decongestants which have not been helpful.  She noted chest pain with an associated cough.  The chest pain was not associated with any radiation down her left arm or jaw.  She described the pain as crushing in nature.  It was not worsened with inspiration or while leaning forward.  She endorsed night sweats last night but denies any fever.  ED Course: On arrival, she is afebrile, tachycardic at 117, respiratory rate of 18, and blood pressure of 138/67.  She was saturating at 96% on room air and there is concern for asthma exacerbation versus  viral upper respiratory tract infection versus PE.  She has gotten 3 liquid treatments and steroid treatment. Elevated D dimer. CTA could not show the pulmonary arteries.   Meds:  Albuterol as needed  Past Medical History Asthma History of resolved heart murmur  Past Surgical History:  Procedure Laterality Date   ADENOIDECTOMY     CESAREAN SECTION N/A 01/02/2023   Procedure: CESAREAN SECTION;  Surgeon: Lazaro Arms, MD;  Location: [facility] LD ORS;  Service: Obstetrics;  Laterality: N/A;   FOOT SURGERY Right 2015   TONSILECTOMY/ADENOIDECTOMY WITH MYRINGOTOMY      Social:  Lives With: Husband, 2 children, and roommate Occupation: Teaching laboratory technician: Independent of ADLs and IADLs PCP: None Substances: 4 cigarettes every day for the past 12 years; 1 glass of wine before bed every night; denies any recreational drugs  Family History:   Family History  Problem Relation Age of Onset   Celiac disease Mother    Alcoholism Father    Heart Problems Father    Bipolar disorder Brother    Depression Brother    Anxiety disorder Brother    Diabetes Maternal Grandmother    Celiac disease Maternal Grandmother     Allergies: Allergies as of 05/15/2023 - Review Complete 05/15/2023  Allergen Reaction Noted  Shellfish allergy Anaphylaxis 08/22/2021   Amoxicillin Hives 05/26/2015   Other  09/21/2016   Penicillins  09/21/2016   Latex Rash 08/22/2021   Tape Rash 05/15/2023    Review of Systems: A complete ROS was negative except as per HPI.   OBJECTIVE:   Physical Exam: Blood pressure (!) 127/92, pulse (!) 112, temperature 99 F (37.2 C), temperature source Oral, resp. rate 18, height 5\' 5"  (1.651 m), SpO2 100%, currently breastfeeding.  Constitutional: well-appearing in no acute distress HENT: normocephalic atraumatic, mucous membranes moist Eyes: conjunctiva non-erythematous Neck: supple Cardiovascular: Tachycardic with regular rhythm; no lower extremity edema Pulmonary/Chest:  Mild bilateral wheezing, able to speak full sentences w/o SOB or signs of respiratory distress Abdominal: soft, non-tender, non-distended MSK: normal bulk and tone Neurological: alert & oriented x 3, 5/5 strength in bilateral upper and lower extremities, normal gait Skin: warm and dry  Labs: CBC    Component Value Date/Time   WBC 9.3 05/15/2023 1248   RBC 4.98 05/15/2023 1248   HGB 13.5 05/15/2023 1248   HGB 11.7 11/26/2022 1153   HCT 42.5 05/15/2023 1248   HCT 35.9 11/26/2022 1153   PLT 204 05/15/2023 1248   PLT 160 11/26/2022 1153   MCV 85.3 05/15/2023 1248   MCV 84 11/26/2022 1153   MCH 27.1 05/15/2023 1248   MCHC 31.8 05/15/2023 1248   RDW 13.7 05/15/2023 1248   RDW 14.0 11/26/2022 1153   LYMPHSABS 1.7 07/02/2022 1422   EOSABS 0.3 07/02/2022 1422   BASOSABS 0.0 07/02/2022 1422     CMP     Component Value Date/Time   NA 139 05/15/2023 1248   NA 138 11/26/2022 1153   K 3.5 05/15/2023 1248   CL 104 05/15/2023 1248   CO2 26 05/15/2023 1248   GLUCOSE 115 (H) 05/15/2023 1248   BUN 12 05/15/2023 1248   BUN 4 (L) 11/26/2022 1153   CREATININE 0.88 05/15/2023 1248   CALCIUM 8.8 (L) 05/15/2023 1248   PROT 7.3 05/15/2023 1248   PROT 6.2 11/26/2022 1153   ALBUMIN 3.9 05/15/2023 1248   ALBUMIN 3.6 (L) 11/26/2022 1153   AST 25 05/15/2023 1248   ALT 25 05/15/2023 1248   ALKPHOS 65 05/15/2023 1248   BILITOT 0.9 05/15/2023 1248   BILITOT 0.3 11/26/2022 1153   GFRNONAA >60 05/15/2023 1248   GFRAA >60 06/21/2017 0700    Imaging: DG Chest 1 View IMPRESSION: No active disease.  CT Angio Chest PE  IMPRESSION: 1. Suboptimal visualization of the pulmonary arteries, despite the repeated injection. On the presented images, there is no evidence of central pulmonary embolus. 2. No acute abnormality identified within the chest.  EKG: personally reviewed my interpretation is sinus tachycardia. ASSESSMENT & PLAN:   Assessment & Plan by Problem: Principal Problem:   Asthma  exacerbation   Ms. [name] is a 32 year old female with past medical history of bipolar disorder, anixety, depression, PTSD, asthma who presents with shortness of breath  #Asthma Exacerbation 2/2 rhinovirus Patient presents with 2-day history of shortness of breath with concern for asthma exacerbation in the context of acute upper respiratory tract infection.  COVID and influenza virus panel are negative.  CMP and CBC with were unremarkable.  D-dimer was elevated at 0.53.  Due to suboptimal visualization of the pulmonary arteries on the CTA with elevated D-dimer, V/Q scan was ordered.  Her Wells score was 1.5.  While patient remains tachycardic, acute PE unlikely as her shortness of breath and tachycardia are likely in  the context of her asthma exacerbation with recent steroids.  Asthma exacerbation is likely compounded by her recent sick contact with her children who are currently experiencing upper respiratory tract infection. Currently does not have a PCP. - Follow-up V/Q scan - Follow-up respiratory panel by PCR - Continue DuoNebs and oral prednisone - Robitussin for cough - Consider adding LABA/ICS to asthma regimen  #History of depression/Anxiety Not currently treated.  Follow-up outpatient  Diet: Normal VTE:  Rivaroxaban Code: Full  Prior to Admission Living Arrangement: Home Anticipated Discharge Location: Home Barriers to Discharge: Resolution of breathing  Dispo: Admit patient to Observation with expected length of stay less than 2 midnights.  Signed: Morrie Sheldon, MD Internal Medicine Resident PGY-1  05/15/2023, 8:41 PM   Katheran James, DO  [phone]

## 2023-05-15 NOTE — ED Triage Notes (Signed)
Pt came in via POV d/t waking up yesterday at 0500 & has been having a productive cough, difficulty breathing. Reports throughout the day she worsened & using a saline nebulizer q4h has not helped to relieve any s/s. A/Ox4, denies pain, unknown if she's been febrile, does reports her kids have been sick as well.

## 2023-05-15 NOTE — ED Provider Notes (Signed)
[place] EMERGENCY DEPARTMENT AT [facility] Provider Note   CSN: [id number] Arrival date & time: 05/15/23  1158     History  Chief Complaint  Patient presents with   Cough   Difficulty Breathing    Brandy Ruiz is a 32 y.o. female.   Cough Associated symptoms: shortness of breath and wheezing      32 year old female with medical history significant for bipolar disorder, anxiety, depression, PTSD, asthma who presents to the emergency department with chest pain that is pleuritic, cough, shortness of breath.  She states that her child had symptoms of a URI and she now has symptoms as well.  She endorses sharp pleuritic discomfort with associated shortness of breath.  She has tried nasal decongestions and inhaler at home without significant relief.  She endorses a mildly productive cough.  Home Medications Prior to Admission medications   Medication Sig Start Date End Date Taking? Authorizing Provider  acetaminophen (TYLENOL) 500 MG tablet Take 2 tablets (1,000 mg total) by mouth every 6 (six) hours. 01/04/23   Vonna Drafts, MD  albuterol (PROVENTIL) (2.5 MG/3ML) 0.083% nebulizer solution Take 3 mLs (2.5 mg total) by nebulization every 6 (six) hours as needed for wheezing or shortness of breath. 08/23/22   Fondaw, Wylder S, PA  budesonide (PULMICORT) 180 MCG/ACT inhaler Inhale 1 puff into the lungs in the morning and at bedtime. 11/27/22   Lennart Pall, MD  ferrous sulfate 325 (65 FE) MG tablet Take 1 tablet (325 mg total) by mouth daily with breakfast. 01/04/23   Vonna Drafts, MD  furosemide (LASIX) 20 MG tablet Take 1 tablet (20 mg total) by mouth daily for 5 days. 01/04/23 01/09/23  Vonna Drafts, MD  ibuprofen (ADVIL) 600 MG tablet Take 1 tablet (600 mg total) by mouth every 6 (six) hours. 01/04/23   Vonna Drafts, MD  oxyCODONE (OXY IR/ROXICODONE) 5 MG immediate release tablet Take 1 tablet (5 mg total) by mouth every 4 (four) hours as needed for moderate pain.  01/04/23   Vonna Drafts, MD  predniSONE (STERAPRED UNI-PAK 21 TAB) 10 MG (21) TBPK tablet 2 tablet a day for 5 days then one a day until finished 12/24/22   Adam Phenix, MD  Prenatal Vit-Fe Fumarate-FA (PREPLUS) 27-1 MG TABS Take 1 tablet by mouth daily. 04/17/21   Brock Bad, MD      Allergies    Shellfish allergy, Amoxicillin, Other, Penicillins, and Latex    Review of Systems   Review of Systems  Respiratory:  Positive for cough, shortness of breath and wheezing.     Physical Exam Updated Vital Signs BP (!) 127/92 (BP Location: Right Arm)   Pulse (!) 112   Temp 99 F (37.2 C) (Oral)   Resp 18   Ht 5\' 5"  (1.651 m)   SpO2 100%   BMI 41.27 kg/m  Physical Exam Vitals and nursing note reviewed.  Constitutional:      General: She is not in acute distress.    Appearance: She is well-developed.  HENT:     Head: Normocephalic and atraumatic.  Eyes:     Conjunctiva/sclera: Conjunctivae normal.  Cardiovascular:     Rate and Rhythm: Regular rhythm. Tachycardia present.  Pulmonary:     Effort: Pulmonary effort is normal. No respiratory distress.     Breath sounds: Wheezing present.     Comments: Expiratory wheezing all lung fields Abdominal:     Palpations: Abdomen is soft.     Tenderness: There is no  abdominal tenderness.  Musculoskeletal:        General: No swelling.     Cervical back: Neck supple.  Skin:    General: Skin is warm and dry.     Capillary Refill: Capillary refill takes less than 2 seconds.  Neurological:     Mental Status: She is alert.  Psychiatric:        Mood and Affect: Mood normal.     ED Results / Procedures / Treatments   Labs (all labs ordered are listed, but only abnormal results are displayed) Labs Reviewed  COMPREHENSIVE METABOLIC PANEL - Abnormal; Notable for the following components:      Result Value   Glucose, Bld 115 (*)    Calcium 8.8 (*)    All other components within normal limits  D-DIMER, QUANTITATIVE - Abnormal;  Notable for the following components:   D-Dimer, Quant 0.53 (*)    All other components within normal limits  RESP PANEL BY RT-PCR (RSV, FLU A&B, COVID)  RVPGX2  CBC  HCG, SERUM, QUALITATIVE    EKG EKG Interpretation Date/Time:  Saturday May 15 2023 12:17:12 EDT Ventricular Rate:  112 PR Interval:  132 QRS Duration:  82 QT Interval:  322 QTC Calculation: 439 R Axis:   90  Text Interpretation: Sinus tachycardia Rightward axis Borderline ECG No previous ECGs available Confirmed by Ernie Avena ([id number]) on 05/15/2023 2:16:15 PM  Radiology CT Angio Chest PE W and/or Wo Contrast  Result Date: 05/15/2023 CLINICAL DATA:  Positive D-dimer.  Suspected pulmonary embolus. EXAM: CT ANGIOGRAPHY CHEST WITH CONTRAST TECHNIQUE: Multidetector CT imaging of the chest was performed using the standard protocol during bolus administration of intravenous contrast. Multiplanar CT image reconstructions and MIPs were obtained to evaluate the vascular anatomy. RADIATION DOSE REDUCTION: This exam was performed according to the departmental dose-optimization program which includes automated exposure control, adjustment of the mA and/or kV according to patient size and/or use of iterative reconstruction technique. CONTRAST:  OMNIPAQUE IOHEXOL 350 MG/ML SOLN COMPARISON:  Chest radiograph May 15, 2023 FINDINGS: Cardiovascular: Suboptimal visualization of the pulmonary arteries, despite the repeated injection. On the presented images. There is no evidence of central pulmonary embolus. Normal heart size. No pericardial effusion. Mediastinum/Nodes: No enlarged mediastinal, hilar, or axillary lymph nodes. Thyroid gland, trachea, and esophagus demonstrate no significant findings. Lungs/Pleura: Lungs are clear. No pleural effusion or pneumothorax. Upper Abdomen: No acute abnormality. Musculoskeletal: No chest wall abnormality. No acute or significant osseous findings. Review of the MIP images confirms the above  findings. IMPRESSION: 1. Suboptimal visualization of the pulmonary arteries, despite the repeated injection. On the presented images, there is no evidence of central pulmonary embolus. 2. No acute abnormality identified within the chest. Electronically Signed   By: Ted Mcalpine M.D.   On: 05/15/2023 18:04   DG Chest 1 View  Result Date: 05/15/2023 CLINICAL DATA:  Shortness of breath. EXAM: CHEST  1 VIEW COMPARISON:  None Available. FINDINGS: The heart size and mediastinal contours are within normal limits. Both lungs are clear. The visualized skeletal structures are unremarkable. IMPRESSION: No active disease. Electronically Signed   By: Lupita Raider M.D.   On: 05/15/2023 14:38    Procedures Procedures    Medications Ordered in ED Medications  iohexol (OMNIPAQUE) 350 MG/ML injection 75 mL (has no administration in time range)  magnesium sulfate IVPB 2 g 50 mL (has no administration in time range)  ipratropium-albuterol (DUONEB) 0.5-2.5 (3) MG/3ML nebulizer solution 3 mL (3 mLs Nebulization Given 05/15/23 1514)  methylPREDNISolone sodium succinate (SOLU-MEDROL) 125 mg/2 mL injection 125 mg (125 mg Intravenous Given 05/15/23 1442)  iohexol (OMNIPAQUE) 350 MG/ML injection 150 mL (150 mLs Intravenous Contrast Given 05/15/23 1701)    ED Course/ Medical Decision Making/ A&P                                 Medical Decision Making Amount and/or Complexity of Data Reviewed Labs: ordered. Radiology: ordered.  Risk Prescription drug management. Decision regarding hospitalization.    32 year old female with medical history significant for bipolar disorder, anxiety, depression, PTSD, asthma who presents to the emergency department with chest pain that is pleuritic, cough, shortness of breath.  She states that her child had symptoms of a URI and she now has symptoms as well.  She endorses sharp pleuritic discomfort with associated shortness of breath.  She has tried nasal decongestions and  inhaler at home without significant relief.  She endorses a mildly productive cough.  On arrival, the patient was afebrile, tachycardic heart rate 117, not tachypneic RR 18, BP 138/67 on my evaluation, saturating 96% on room air.  Concern for asthma exacerbation, viral URI, considered PE given the patient's tachycardia.  She has not utilize any nebulizers today.  She is requesting treatment for an asthma exacerbation.  My most likely diagnosis is viral URI resulting in asthma exacerbation.  IV access was obtained and the patient was administered IV Solu-Medrol and DuoNebs x 3 were ordered.  EKG: [place] tachycardia, ventricular rate 112, no acute ischemic changes noted.  CXR:  IMPRESSION:  No active disease.    Labs: Dimer nonspecifically elevated to 0.53, COVID-19 and influenza PCR testing and RSV PCR testing negative, hCG normal, CBC without a leukocytosis or anemia, CMP unremarkable.  With the dimer elevation, pleuritic chest discomfort, considered PE.  CTA imaging ordered.  CTA PE: IMPRESSION:  1. Suboptimal visualization of the pulmonary arteries, despite the  repeated injection. On the presented images, there is no evidence of  central pulmonary embolus.  2. No acute abnormality identified within the chest.     Reassessment: On reassessment, the patient was still persistently wheezing.  IV magnesium ordered.  She feels like this is one of the worst asthma exacerbations she has had.  CT imaging did not fully rule out PE.  VQ scan ordered.  Internal medicine consulted for admission.   Final Clinical Impression(s) / ED Diagnoses Final diagnoses:  Exacerbation of asthma, unspecified asthma severity, unspecified whether persistent  Positive D dimer    Rx / DC Orders ED Discharge Orders     None         Ernie Avena, MD 05/15/23 [phone]

## 2023-05-15 NOTE — ED Provider Triage Note (Signed)
Emergency Medicine Provider Triage Evaluation Note  Brandy Ruiz , a 32 y.o. female  was evaluated in triage.  Pt complains of cough, shortness of breath since yesterday.  States that her children at home are also have similar symptoms.  Denies any chest pain, fever, nausea, vomiting.  States he has tried some nasal decongestants and inhaler with no relief.  History of asthma.  Review of Systems  Positive: As above Negative: As above   Physical Exam  BP 99/79   Pulse (!) 117   Temp 99.3 F (37.4 C) (Oral)   Resp 18   Ht 5\' 5"  (1.651 m)   SpO2 95%   BMI 41.27 kg/m  Gen:   Awake, no distress   Resp:  Normal effort  MSK:   Moves extremities without difficulty  Other:  Mild wheezing on auscultation.  Medical Decision Making  Medically screening exam initiated at 12:48 PM.  Appropriate orders placed.  Brandy Ruiz was informed that the remainder of the evaluation will be completed by another provider, this initial triage assessment does not replace that evaluation, and the importance of remaining in the ED until their evaluation is complete.    Brandy Ruiz, [place] 05/15/23 1250

## 2023-05-15 NOTE — ED Notes (Signed)
Floor called and informed on pt's arrival.

## 2023-05-15 NOTE — ED Notes (Addendum)
ED TO INPATIENT HANDOFF REPORT  ED Nurse Name and Phone #: [place] Rn [phone]  S Name/Age/Gender Brandy Ruiz 32 y.o. female Room/Bed: 007C/007C  Code Status   Code Status: Full Code  Home/SNF/Other Home Patient oriented to: self, place, time, and situation Is this baseline? Yes   Triage Complete: Triage complete  Chief Complaint Asthma exacerbation [J45.901]  Triage Note Pt came in via POV d/t waking up yesterday at 0500 & has been having a productive cough, difficulty breathing. Reports throughout the day she worsened & using a saline nebulizer q4h has not helped to relieve any s/s. A/Ox4, denies pain, unknown if she's been febrile, does reports her kids have been sick as well.    Allergies Allergies  Allergen Reactions   Shellfish Allergy Anaphylaxis   Amoxicillin Hives   Other     I'm not supposed to take Beta-blockers.   Penicillins     "I turn pink."   Latex Rash   Tape Rash    Level of Care/Admitting Diagnosis ED Disposition     ED Disposition  Admit   Condition  --   Comment  Hospital Area: [facility] [[id number]]  Level of Care: Med-Surg [16]  May place patient in observation at [facility] or [facility] if equivalent level of care is available:: No  Covid Evaluation: Confirmed COVID Negative  Diagnosis: Asthma exacerbation [[id number]]  Admitting Physician: Dickie La [[id number]]  Attending Physician: Dickie La [[id number]]          B Medical/Surgery History Past Medical History:  Diagnosis Date   Anxiety    Asthma    Bipolar 1 disorder, manic, mild (HCC)    Depression    Environmental and seasonal allergies    Heart murmur    History of gestational hypertension    PTSD (post-traumatic stress disorder)    Past Surgical History:  Procedure Laterality Date   ADENOIDECTOMY     CESAREAN SECTION N/A 01/02/2023   Procedure: CESAREAN SECTION;  Surgeon: Lazaro Arms, MD;  Location: [facility] LD ORS;  Service: Obstetrics;   Laterality: N/A;   FOOT SURGERY Right 2015   TONSILECTOMY/ADENOIDECTOMY WITH MYRINGOTOMY       A IV Location/Drains/Wounds Patient Lines/Drains/Airways Status     Active Line/Drains/Airways     Name Placement date Placement time Site Days   Peripheral IV 05/15/23 20 G Right Antecubital 05/15/23  1441  Antecubital  less than 1            Intake/Output Last 24 hours No intake or output data in the 24 hours ending 05/15/23 2145  Labs/Imaging Results for orders placed or performed during the hospital encounter of 05/15/23 (from the past 48 hour(s))  Resp panel by RT-PCR (RSV, Flu A&B, Covid) Anterior Nasal Swab     Status: None   Collection Time: 05/15/23 12:48 PM   Specimen: Anterior Nasal Swab  Result Value Ref Range   SARS Coronavirus 2 by RT PCR NEGATIVE NEGATIVE   Influenza A by PCR NEGATIVE NEGATIVE   Influenza B by PCR NEGATIVE NEGATIVE    Comment: (NOTE) The Xpert Xpress SARS-CoV-2/FLU/RSV plus assay is intended as an aid in the diagnosis of influenza from Nasopharyngeal swab specimens and should not be used as a sole basis for treatment. Nasal washings and aspirates are unacceptable for Xpert Xpress SARS-CoV-2/FLU/RSV testing.  Fact Sheet for Patients: BloggerCourse.com  Fact Sheet for Healthcare Providers: SeriousBroker.it  This test is not yet approved or cleared by the [place] FDA and has  been authorized for detection and/or diagnosis of SARS-CoV-2 by FDA under an Emergency Use Authorization (EUA). This EUA will remain in effect (meaning this test can be used) for the duration of the COVID-19 declaration under Section 564(b)(1) of the Act, 21 U.S.C. section 360bbb-3(b)(1), unless the authorization is terminated or revoked.     Resp Syncytial Virus by PCR NEGATIVE NEGATIVE    Comment: (NOTE) Fact Sheet for Patients: BloggerCourse.com  Fact Sheet for Healthcare  Providers: SeriousBroker.it  This test is not yet approved or cleared by the [place] FDA and has been authorized for detection and/or diagnosis of SARS-CoV-2 by FDA under an Emergency Use Authorization (EUA). This EUA will remain in effect (meaning this test can be used) for the duration of the COVID-19 declaration under Section 564(b)(1) of the Act, 21 U.S.C. section 360bbb-3(b)(1), unless the authorization is terminated or revoked.  Performed at [facility], [address] [place], [place] [address]   Comprehensive metabolic panel     Status: Abnormal   Collection Time: 05/15/23 12:48 PM  Result Value Ref Range   Sodium 139 135 - 145 mmol/L   Potassium 3.5 3.5 - 5.1 mmol/L   Chloride 104 98 - 111 mmol/L   CO2 26 22 - 32 mmol/L   Glucose, Bld 115 (H) 70 - 99 mg/dL    Comment: Glucose reference range applies only to samples taken after fasting for at least 8 hours.   BUN 12 6 - 20 mg/dL   Creatinine, Ser 6.04 0.44 - 1.00 mg/dL   Calcium 8.8 (L) 8.9 - 10.3 mg/dL   Total Protein 7.3 6.5 - 8.1 g/dL   Albumin 3.9 3.5 - 5.0 g/dL   AST 25 15 - 41 U/L   ALT 25 0 - 44 U/L   Alkaline Phosphatase 65 38 - 126 U/L   Total Bilirubin 0.9 0.3 - 1.2 mg/dL   GFR, Estimated >54 >09 mL/min    Comment: (NOTE) Calculated using the CKD-EPI Creatinine Equation (2021)    Anion gap 9 5 - 15    Comment: Performed at [facility], [address], [place], [place] [address]  CBC     Status: None   Collection Time: 05/15/23 12:48 PM  Result Value Ref Range   WBC 9.3 4.0 - 10.5 K/uL   RBC 4.98 3.87 - 5.11 MIL/uL   Hemoglobin 13.5 12.0 - 15.0 g/dL   HCT 47.8 29.5 - 62.1 %   MCV 85.3 80.0 - 100.0 fL   MCH 27.1 26.0 - 34.0 pg   MCHC 31.8 30.0 - 36.0 g/dL   RDW 30.8 65.7 - 84.6 %   Platelets 204 150 - 400 K/uL   nRBC 0.0 0.0 - 0.2 %    Comment: Performed at [facility], [address] [place], [place] [address]  hCG, serum, qualitative      Status: None   Collection Time: 05/15/23 12:48 PM  Result Value Ref Range   Preg, Serum NEGATIVE NEGATIVE    Comment:        THE SENSITIVITY OF THIS METHODOLOGY IS >10 mIU/mL. Performed at [facility], [address], [place], [place] [address]   D-dimer, quantitative     Status: Abnormal   Collection Time: 05/15/23  2:36 PM  Result Value Ref Range   D-Dimer, Quant 0.53 (H) 0.00 - 0.50 ug/mL-FEU    Comment: (NOTE) At the manufacturer cut-off value of 0.5 g/mL FEU, this assay has a negative predictive  value of 95-100%.This assay is intended for use in conjunction with a clinical pretest probability (PTP) assessment model to exclude pulmonary embolism (PE) and deep venous thrombosis (DVT) in outpatients suspected of PE or DVT. Results should be correlated with clinical presentation. Performed at [facility], [address] [place], [place] [address]    CT Angio Chest PE W and/or Wo Contrast  Result Date: 05/15/2023 CLINICAL DATA:  Positive D-dimer.  Suspected pulmonary embolus. EXAM: CT ANGIOGRAPHY CHEST WITH CONTRAST TECHNIQUE: Multidetector CT imaging of the chest was performed using the standard protocol during bolus administration of intravenous contrast. Multiplanar CT image reconstructions and MIPs were obtained to evaluate the vascular anatomy. RADIATION DOSE REDUCTION: This exam was performed according to the departmental dose-optimization program which includes automated exposure control, adjustment of the mA and/or kV according to patient size and/or use of iterative reconstruction technique. CONTRAST:  OMNIPAQUE IOHEXOL 350 MG/ML SOLN COMPARISON:  Chest radiograph May 15, 2023 FINDINGS: Cardiovascular: Suboptimal visualization of the pulmonary arteries, despite the repeated injection. On the presented images. There is no evidence of central pulmonary embolus. Normal heart size. No pericardial effusion. Mediastinum/Nodes: No enlarged mediastinal, hilar, or  axillary lymph nodes. Thyroid gland, trachea, and esophagus demonstrate no significant findings. Lungs/Pleura: Lungs are clear. No pleural effusion or pneumothorax. Upper Abdomen: No acute abnormality. Musculoskeletal: No chest wall abnormality. No acute or significant osseous findings. Review of the MIP images confirms the above findings. IMPRESSION: 1. Suboptimal visualization of the pulmonary arteries, despite the repeated injection. On the presented images, there is no evidence of central pulmonary embolus. 2. No acute abnormality identified within the chest. Electronically Signed   By: Ted Mcalpine M.D.   On: 05/15/2023 18:04   DG Chest 1 View  Result Date: 05/15/2023 CLINICAL DATA:  Shortness of breath. EXAM: CHEST  1 VIEW COMPARISON:  None Available. FINDINGS: The heart size and mediastinal contours are within normal limits. Both lungs are clear. The visualized skeletal structures are unremarkable. IMPRESSION: No active disease. Electronically Signed   By: Lupita Raider M.D.   On: 05/15/2023 14:38    Pending Labs Unresulted Labs (From admission, onward)     Start     Ordered   05/16/23 0500  Basic metabolic panel  Tomorrow morning,   R        05/15/23 2007   05/16/23 0500  CBC  Tomorrow morning,   R        05/15/23 2007   05/15/23 2008  Respiratory (~20 pathogens) panel by PCR  (Respiratory panel by PCR (~20 pathogens, ~24 hr TAT)  w precautions)  Once,   R        05/15/23 2007            Vitals/Pain Today's Vitals   05/15/23 1715 05/15/23 1744 05/15/23 1915 05/15/23 2130  BP:  (!) 127/92 139/89 124/64  Pulse: (!) 104 (!) 112 (!) 111 (!) 110  Resp:  18 (!) 26 16  Temp:  99 F (37.2 C)    TempSrc:  Oral    SpO2: 96% 100% 96% 94%  Height:      PainSc:        Isolation Precautions Droplet precaution  Medications Medications  iohexol (OMNIPAQUE) 350 MG/ML injection 75 mL (has no administration in time range)  rivaroxaban (XARELTO) tablet 10 mg (has no  administration in time range)  ipratropium-albuterol (DUONEB) 0.5-2.5 (3) MG/3ML nebulizer solution 3 mL (has no administration in time range)  predniSONE (DELTASONE) tablet  40 mg (has no administration in time range)  ipratropium-albuterol (DUONEB) 0.5-2.5 (3) MG/3ML nebulizer solution 3 mL (3 mLs Nebulization Given 05/15/23 1514)  methylPREDNISolone sodium succinate (SOLU-MEDROL) 125 mg/2 mL injection 125 mg (125 mg Intravenous Given 05/15/23 1442)  iohexol (OMNIPAQUE) 350 MG/ML injection 150 mL (150 mLs Intravenous Contrast Given 05/15/23 1701)  magnesium sulfate IVPB 2 g 50 mL (0 g Intravenous Stopped 05/15/23 2108)    Mobility walks     Focused Assessments Pulmonary Assessment Handoff:  Lung sounds:  Wheezing noted O2 Device: Room Air      R Recommendations: See Admitting Provider Note  Report given to:   Additional Notes: A&Ox4, GCS 15, room air

## 2023-05-15 NOTE — Hospital Course (Addendum)
Ms Brandy Ruiz presented to [facility] on [place] 05/15/23 with acute shortness of breath and was admitted for an asthma exacerbation. She was in her normal state of health until waking up Friday with severe dyspnea that was not relieved by her albuterol inhaler. Her young kids are sick at home. She tested positive for Rhinovirus. She was was treated with oxygen, prednisone, and duonebs as needed. She did not have an oxygen requirement on the morning of 9/8. She ambulated before discharge with O2 no lower than 94% on RA. She was concerned about leg swelling so we evaluated with a b/l [place] for DVT - that was negative. She was ready for discharge to the community. She was sent home with a prescription for [facility] inhaler and 5 day total course of prednisone to be picked up on Monday at the [facility] pharmacy. She has been invited to establish care at the IM resident clinic and a follow-up will be scheduled.   Asthma Exacerbation 2/2 rhinovirus Patient with 2-day history of shortness of breath with concern for asthma exacerbation in the context of acute upper respiratory tract infection. COVID and influenza virus panel negative. Rhinovirus positive. CMP and CBC with were unremarkable.  D-dimer was elevated at 0.53.  CTA without evidence of PE but due to suboptimal visualization of the pulmonary arteries and elevated D-dimer, V/Q scan was ordered but not completed due to rapid clinical improvement. Her Wells score was 1.5. Acute PE unlikely as her shortness of breath and tachycardia at presentation are explained by her asthma exacerbation with recent steroids and these issues resolved with supportive care. A b/l LE DVT study was also negative. Asthma exacerbation was likely compounded by her recent sick contact with her children who are currently experiencing upper respiratory tract infection. She recovered quickly with treatment per below. On discharge, she had no oxygen requirement at rest or with ambulation,  confirmed by ambulatory pulse ox. - Treated with DuoNebs and oral prednisone - Robitussin for cough - To be sent home with LABA/ICS prn inhaler   History of depression/Anxiety Not currently treated. Follow-up outpatient

## 2023-05-15 NOTE — ED Notes (Signed)
Awaiting patient from lobby 

## 2023-05-16 ENCOUNTER — Observation Stay (HOSPITAL_BASED_OUTPATIENT_CLINIC_OR_DEPARTMENT_OTHER): Payer: Self-pay

## 2023-05-16 DIAGNOSIS — R609 Edema, unspecified: Secondary | ICD-10-CM

## 2023-05-16 DIAGNOSIS — J4521 Mild intermittent asthma with (acute) exacerbation: Secondary | ICD-10-CM

## 2023-05-16 LAB — BASIC METABOLIC PANEL
Anion gap: 10 (ref 5–15)
BUN: 14 mg/dL (ref 6–20)
CO2: 21 mmol/L — ABNORMAL LOW (ref 22–32)
Calcium: 8.9 mg/dL (ref 8.9–10.3)
Chloride: 106 mmol/L (ref 98–111)
Creatinine, Ser: 0.83 mg/dL (ref 0.44–1.00)
GFR, Estimated: >60 mL/min (ref >60)
Glucose, Bld: 150 mg/dL — ABNORMAL HIGH (ref 70–99)
Potassium: 3.9 mmol/L (ref 3.5–5.1)
Sodium: 137 mmol/L (ref 135–145)

## 2023-05-16 LAB — CBC
HCT: 42.2 % (ref 36.0–46.0)
Hemoglobin: 13.4 g/dL (ref 12.0–15.0)
MCH: 26.2 pg (ref 26.0–34.0)
MCHC: 31.8 g/dL (ref 30.0–36.0)
MCV: 82.6 fL (ref 80.0–100.0)
Platelets: 223 10*3/uL (ref 150–400)
RBC: 5.11 MIL/uL (ref 3.87–5.11)
RDW: 13.7 % (ref 11.5–15.5)
WBC: 10 10*3/uL (ref 4.0–10.5)
nRBC: 0 % (ref 0.0–0.2)

## 2023-05-16 MED ORDER — DULERA 100-5 MCG/ACT IN AERO
INHALATION_SPRAY | RESPIRATORY_TRACT | 6 refills | Status: DC
Start: 2023-05-16 — End: 2023-07-05
  Filled 2023-05-16: qty 1, fill #0
  Filled 2023-05-17 (×2): qty 13, 30d supply, fill #0

## 2023-05-16 MED ORDER — GUAIFENESIN ER 600 MG PO TB12
600.0000 mg | ORAL_TABLET | Freq: Two times a day (BID) | ORAL | 0 refills | Status: DC
Start: 2023-05-16 — End: 2023-07-06
  Filled 2023-05-16: qty 10, 5d supply, fill #0

## 2023-05-16 MED ORDER — PREDNISONE 20 MG PO TABS
40.0000 mg | ORAL_TABLET | Freq: Every day | ORAL | 0 refills | Status: DC
  Filled 2023-05-16: qty 6, 3d supply, fill #0

## 2023-05-16 MED ORDER — GUAIFENESIN ER 600 MG PO TB12
600.0000 mg | ORAL_TABLET | Freq: Two times a day (BID) | ORAL | Status: DC
  Administered 2023-05-16: 600 mg via ORAL
  Filled 2023-05-16: qty 1

## 2023-05-16 NOTE — Discharge Instructions (Addendum)
Thank you for allowing [place] to be part of your care.   You were hospitalized for an asthma exacerbation. It was probably brought on by your cold - you have a rhinovirus infection, which causes the common cold. Take it easy for the first few days at home. You are well enough to leave the hospital but should ease into your normal activities over the next few days.  We will arrange for you to follow up at the Internal Medicine Resident Clinic. Please expect a phone call to set this up. You can also establish care as a regular patient there. The clinic is located on the ground floor of [facility] on the East wing - past the cafeteria and underneath the emergency room. It can be difficult to find at first, so please arrive with extra time. When you receive your call about scheduling, please ask them for additional directions. If you need to contact our clinic, the number is [phone]. If clinic is closed, then call the [place] [facility] and ask for the resident on call.  We will send your prescriptions to the [facility] on [address] (see address below). PICK THESE UP ON MONDAY in the morning if possible. It will have a new inhaler called Dulera that you will take in place of albuterol. For the first five days at home, take it once daily. After that, take it as needed. You will also receive predisone to help calm your lungs. Take two pills daily for for three additional days - Monday, Tuesday, and Wednesday. I will also send additional Mucinex for congestion as you recover. Take one tablet twice daily as needed. [address]  [phone]  Please call our clinic if you have any questions or concerns, we may be able to help and keep you from a long and expensive emergency room wait. Our clinic and after hours phone number is [phone], the best time to call is Monday through Friday 9 am to 4 pm but there is always someone available 24/7 if you have an emergency. If you  need medication refills please notify your pharmacy one week in advance and they will send [place] a request.

## 2023-05-16 NOTE — Discharge Summary (Addendum)
Name: Desirie Bosscher MRN: [id number] DOB: 1991-01-22 32 y.o. PCP: Pcp, No  Date of Admission: 05/15/2023 12:06 PM Date of Discharge:  05/16/23 Attending Physician: Dr.  Sol Blazing  DISCHARGE DIAGNOSIS:  Primary Problem: Asthma exacerbation   Hospital Problems: Principal Problem:   Asthma exacerbation    DISCHARGE MEDICATIONS:   Allergies as of 05/16/2023       Reactions   Shellfish Allergy Anaphylaxis   Amoxicillin Hives   Other    I'm not supposed to take Beta-blockers.   Penicillins    "I turn pink."   Latex Rash   Tape Rash        Medication List     STOP taking these medications    Acetaminophen Extra Strength 500 MG Tabs       TAKE these medications    albuterol 108 (90 Base) MCG/ACT inhaler Commonly known as: VENTOLIN HFA Inhale 2 puffs into the lungs every 6 (six) hours as needed for wheezing or shortness of breath.   albuterol (2.5 MG/3ML) 0.083% nebulizer solution Commonly known as: PROVENTIL Take 3 mLs (2.5 mg total) by nebulization every 6 (six) hours as needed for wheezing or shortness of breath.   Dulera 100-5 MCG/ACT Aero Generic drug: mometasone-formoterol For the first week take once daily, afterwards use as needed.   guaiFENesin 600 MG 12 hr tablet Commonly known as: MUCINEX Take 1 tablet (600 mg total) by mouth 2 (two) times daily.   Phenylephrine-APAP-guaiFENesin 5-325-200 MG Caps Take 2 tablets by mouth daily as needed (congestion).   predniSONE 20 MG tablet Commonly known as: DELTASONE Take 2 tablets (40 mg total) by mouth daily with breakfast. Start taking on: May 17, 2023        DISPOSITION AND FOLLOW-UP:  Brandy.Kurstin Ruiz was discharged from [facility] in stable condition. At the hospital follow up visit please address:  Follow-up Recommendations: Follow-up Appointments:  Appointment to be scheduled with the IM Resident clinic where she will establish care. This is a patient without health  insurance (expected health insurance to start in December). At follow-up please consider the following: - evaluate recovery of this asthma exacerbation and overall asthma status. She does not appear to suffer from frequent exacerbations. We will start [facility] as rescue therapy. Please ensure she was able to get this inhaler through the IM program at [facility]. - Consider evaluating for depression and anxiety. This appears to be an underlying issue and is not currently treated.  HOSPITAL COURSE:  Patient Summary: Brandy Mylia Eversman presented to [facility] on Saturday 05/15/23 with acute shortness of breath and was admitted for an asthma exacerbation. She was in her normal state of health until waking up Friday with severe dyspnea that was not relieved by her albuterol inhaler. Her young kids are sick at home. She tested positive for Rhinovirus. She was treated with oxygen, prednisone, and duonebs as needed. She did not have an oxygen requirement on the morning of 9/8. She ambulated before discharge with O2 no lower than 94% on RA. Although there was not convincing clinical evidence of DVT, because patient reported mild leg swelling we evaluated with a b/l [place] for DVT - that was negative. She was ready for discharge to the community. She was sent home with a prescription for [facility] inhaler and 5 day total course of prednisone to be picked up on Monday at the [facility] pharmacy. She has been invited to establish care at the IM resident clinic and a follow-up will be scheduled.  Asthma Exacerbation 2/2 upper respiratory viral infection Patient with 2-day history of shortness of breath with concern for asthma exacerbation in the context of acute upper respiratory tract infection. COVID and influenza virus panel negative. Rhinovirus/enterovirus positive. CMP and CBC with were unremarkable.  D-dimer was elevated at 0.53.  CTA without evidence of PE. Due to suboptimal visualization of the pulmonary arteries and  elevated D-dimer, V/Q scan was ordered but not completed due to rapid clinical improvement and low suspicion for PE. Her Wells score was 1.5. Acute PE unlikely as her shortness of breath and tachycardia at presentation are explained by her asthma exacerbation and these issues resolved with supportive care. A b/l LE DVT study was also negative. Asthma exacerbation was likely compounded by her recent sick contact with her children who are currently experiencing upper respiratory tract infection. She recovered quickly with treatment per below. On discharge, she had no oxygen requirement at rest or with ambulation, confirmed by ambulatory pulse ox. - Treated with DuoNebs and oral prednisone - Robitussin for cough - To be sent home with LABA/ICS prn inhaler   History of depression/Anxiety Not currently treated. Follow-up outpatient   DISCHARGE INSTRUCTIONS:   Discharge Instructions     Call MD for:  difficulty breathing, headache or visual disturbances   Complete by: As directed    Call MD for:  extreme fatigue   Complete by: As directed    Call MD for:  persistant dizziness or light-headedness   Complete by: As directed    Call MD for:  persistant nausea and vomiting   Complete by: As directed    Call MD for:  redness, tenderness, or signs of infection (pain, swelling, redness, odor or green/yellow discharge around incision site)   Complete by: As directed    Call MD for:  severe uncontrolled pain   Complete by: As directed    Call MD for:  temperature >100.4   Complete by: As directed    Diet general   Complete by: As directed    Increase activity slowly   Complete by: As directed        SUBJECTIVE:  Pt feeling well. No pain or chest tightness. Not short of breath. Feels good when on feet. She observed some swelling of both her legs this morning, but not painful. Discharge Vitals:   BP 132/80   Pulse 99   Temp 98.5 F (36.9 C)   Resp 18   Ht 5\' 5"  (1.651 m)   Wt 116.3 kg    SpO2 94%   BMI 42.65 kg/m   OBJECTIVE:  Physical Exam Constitutional:      General: She is not in acute distress.    Appearance: Normal appearance. She is not ill-appearing.  Cardiovascular:     Rate and Rhythm: Normal rate and regular rhythm.     Pulses: Normal pulses.  Pulmonary:     Effort: Pulmonary effort is normal.     Breath sounds: Wheezing present.     Comments: Mild wheezing Abdominal:     General: Abdomen is flat.     Palpations: Abdomen is soft.     Tenderness: There is no abdominal tenderness.  Musculoskeletal:        General: No tenderness.     Right lower leg: No edema.     Left lower leg: No edema.  Skin:    General: Skin is warm and dry.  Neurological:     General: No focal deficit present.     Mental Status:  She is alert and oriented to person, place, and time.  Psychiatric:        Mood and Affect: Mood normal.        Behavior: Behavior normal.      Pertinent Labs, Studies, and Procedures:     Latest Ref Rng & Units 05/16/2023    5:41 AM 05/15/2023   12:48 PM 01/03/2023    4:14 AM  CBC  WBC 4.0 - 10.5 K/uL 10.0  9.3  17.4   Hemoglobin 12.0 - 15.0 g/dL 86.5  78.4  9.2   Hematocrit 36.0 - 46.0 % 42.2  42.5  28.3   Platelets 150 - 400 K/uL 223  204  143        Latest Ref Rng & Units 05/16/2023    5:41 AM 05/15/2023   12:48 PM 11/26/2022   11:53 AM  CMP  Glucose 70 - 99 mg/dL 696  295  284   BUN 6 - 20 mg/dL 14  12  4    Creatinine 0.44 - 1.00 mg/dL 1.32  4.40  1.02   Sodium 135 - 145 mmol/L 137  139  138   Potassium 3.5 - 5.1 mmol/L 3.9  3.5  3.9   Chloride 98 - 111 mmol/L 106  104  106   CO2 22 - 32 mmol/L 21  26  17    Calcium 8.9 - 10.3 mg/dL 8.9  8.8  8.5   Total Protein 6.5 - 8.1 g/dL  7.3  6.2   Total Bilirubin 0.3 - 1.2 mg/dL  0.9  0.3   Alkaline Phos 38 - 126 U/L  65  176   AST 15 - 41 U/L  25  19   ALT 0 - 44 U/L  25  9     CT Angio Chest PE W and/or Wo Contrast  Result Date: 05/15/2023 CLINICAL DATA:  Positive D-dimer.  Suspected  pulmonary embolus. EXAM: CT ANGIOGRAPHY CHEST WITH CONTRAST TECHNIQUE: Multidetector CT imaging of the chest was performed using the standard protocol during bolus administration of intravenous contrast. Multiplanar CT image reconstructions and MIPs were obtained to evaluate the vascular anatomy. RADIATION DOSE REDUCTION: This exam was performed according to the departmental dose-optimization program which includes automated exposure control, adjustment of the mA and/or kV according to patient size and/or use of iterative reconstruction technique. CONTRAST:  OMNIPAQUE IOHEXOL 350 MG/ML SOLN COMPARISON:  Chest radiograph May 15, 2023 FINDINGS: Cardiovascular: Suboptimal visualization of the pulmonary arteries, despite the repeated injection. On the presented images. There is no evidence of central pulmonary embolus. Normal heart size. No pericardial effusion. Mediastinum/Nodes: No enlarged mediastinal, hilar, or axillary lymph nodes. Thyroid gland, trachea, and esophagus demonstrate no significant findings. Lungs/Pleura: Lungs are clear. No pleural effusion or pneumothorax. Upper Abdomen: No acute abnormality. Musculoskeletal: No chest wall abnormality. No acute or significant osseous findings. Review of the MIP images confirms the above findings. IMPRESSION: 1. Suboptimal visualization of the pulmonary arteries, despite the repeated injection. On the presented images, there is no evidence of central pulmonary embolus. 2. No acute abnormality identified within the chest. Electronically Signed   By: Ted Mcalpine M.D.   On: 05/15/2023 18:04   DG Chest 1 View  Result Date: 05/15/2023 CLINICAL DATA:  Shortness of breath. EXAM: CHEST  1 VIEW COMPARISON:  None Available. FINDINGS: The heart size and mediastinal contours are within normal limits. Both lungs are clear. The visualized skeletal structures are unremarkable. IMPRESSION: No active disease. Electronically Signed   By: Fayrene Fearing  Christen Butter M.D.    On: 05/15/2023 14:38     Signed: Katheran James, DO Internal Medicine Resident, PGY-1 [facility]  Pager: [phone] 4:11 PM, 05/16/2023

## 2023-05-16 NOTE — Progress Notes (Signed)
DISCHARGE NOTE HOME Jazzmyne Kellough to be discharged Home per MD order. Discussed prescriptions and follow up appointments with the patient. Prescriptions given to patient; medication list explained in detail. Patient verbalized understanding.  Skin clean, dry and intact without evidence of skin break down, no evidence of skin tears noted. IV catheter discontinued intact. Site without signs and symptoms of complications. Dressing and pressure applied. Pt denies pain at the site currently. No complaints noted.  Patient free of lines, drains, and wounds.   An After Visit Summary (AVS) was printed and given to the patient. Patient escorted via wheelchair, and discharged home via private auto.  Velia Meyer, RN

## 2023-05-16 NOTE — Progress Notes (Signed)
Let provider know that patient ambulation saturation stayed above 95

## 2023-05-16 NOTE — Progress Notes (Signed)
Lower extremity venous bilateral study completed.   Please see CV Proc for preliminary results.   Rachel Hodge, RDMS, RVT  

## 2023-05-17 ENCOUNTER — Other Ambulatory Visit: Payer: Self-pay

## 2023-05-17 ENCOUNTER — Other Ambulatory Visit (HOSPITAL_COMMUNITY): Payer: Self-pay

## 2023-06-07 ENCOUNTER — Encounter: Payer: Self-pay | Admitting: Student

## 2023-06-09 ENCOUNTER — Encounter: Payer: Self-pay | Admitting: Student

## 2023-07-05 ENCOUNTER — Encounter: Payer: Self-pay | Admitting: Student

## 2023-07-05 ENCOUNTER — Other Ambulatory Visit: Payer: Self-pay

## 2023-07-05 ENCOUNTER — Other Ambulatory Visit (HOSPITAL_COMMUNITY): Payer: Self-pay

## 2023-07-05 ENCOUNTER — Ambulatory Visit (INDEPENDENT_AMBULATORY_CARE_PROVIDER_SITE_OTHER): Payer: Self-pay | Admitting: Student

## 2023-07-05 ENCOUNTER — Other Ambulatory Visit: Payer: Self-pay | Admitting: Student

## 2023-07-05 VITALS — HR 89 | Temp 98.6°F | Resp 24 | Ht 65.0 in | Wt 262.6 lb

## 2023-07-05 DIAGNOSIS — F17210 Nicotine dependence, cigarettes, uncomplicated: Secondary | ICD-10-CM

## 2023-07-05 DIAGNOSIS — F419 Anxiety disorder, unspecified: Secondary | ICD-10-CM

## 2023-07-05 DIAGNOSIS — Z0000 Encounter for general adult medical examination without abnormal findings: Secondary | ICD-10-CM

## 2023-07-05 DIAGNOSIS — J4521 Mild intermittent asthma with (acute) exacerbation: Secondary | ICD-10-CM

## 2023-07-05 DIAGNOSIS — Z720 Tobacco use: Secondary | ICD-10-CM

## 2023-07-05 MED ORDER — HYDROXYZINE HCL 10 MG PO TABS
10.0000 mg | ORAL_TABLET | Freq: Three times a day (TID) | ORAL | 1 refills | Status: DC | PRN
  Filled 2023-07-05: qty 30, 10d supply, fill #0

## 2023-07-05 MED ORDER — SERTRALINE HCL 25 MG PO TABS
25.0000 mg | ORAL_TABLET | Freq: Every day | ORAL | 2 refills | Status: DC
  Filled 2023-07-05: qty 30, 30d supply, fill #0

## 2023-07-05 MED ORDER — DULERA 100-5 MCG/ACT IN AERO
INHALATION_SPRAY | RESPIRATORY_TRACT | 6 refills | Status: DC
Start: 2023-07-05 — End: 2023-07-05
  Filled 2023-07-05: qty 13, 28d supply, fill #0

## 2023-07-05 MED ORDER — FLUTICASONE FUROATE-VILANTEROL 100-25 MCG/ACT IN AEPB
1.0000 | INHALATION_SPRAY | Freq: Every day | RESPIRATORY_TRACT | 2 refills | Status: AC
Start: 2023-07-05 — End: ?
  Filled 2023-07-05: qty 60, 30d supply, fill #0

## 2023-07-05 NOTE — Progress Notes (Incomplete)
New Patient Office Visit  Subjective    Patient ID: Brandy Ruiz, female    DOB: 1991/04/09  Age: 32 y.o. MRN: [id number]  CC: No chief complaint on file.   HPI Brandy Ruiz presents to establish care ***  Outpatient Encounter Medications as of 07/05/2023  Medication Sig   fluticasone furoate-vilanterol (BREO ELLIPTA) 100-25 MCG/ACT AEPB Inhale 1 puff into the lungs daily.   hydrOXYzine (ATARAX) 10 MG tablet Take 1 tablet (10 mg total) by mouth 3 (three) times daily as needed.   sertraline (ZOLOFT) 25 MG tablet Take 1 tablet (25 mg total) by mouth daily.   albuterol (PROVENTIL) (2.5 MG/3ML) 0.083% nebulizer solution Take 3 mLs (2.5 mg total) by nebulization every 6 (six) hours as needed for wheezing or shortness of breath.   albuterol (VENTOLIN HFA) 108 (90 Base) MCG/ACT inhaler Inhale 2 puffs into the lungs every 6 (six) hours as needed for wheezing or shortness of breath.   guaiFENesin (MUCINEX) 600 MG 12 hr tablet Take 1 tablet (600 mg total) by mouth 2 (two) times daily.   Phenylephrine-APAP-guaiFENesin 5-325-200 MG CAPS Take 2 tablets by mouth daily as needed (congestion).   predniSONE (DELTASONE) 20 MG tablet Take 2 tablets (40 mg total) by mouth daily with breakfast.   [DISCONTINUED] mometasone-formoterol (DULERA) 100-5 MCG/ACT AERO For the first week take once daily, afterwards use as needed.   [DISCONTINUED] mometasone-formoterol (DULERA) 100-5 MCG/ACT AERO For the first week take once daily, afterwards use as needed.   No facility-administered encounter medications on file as of 07/05/2023.    Past Medical History:  Diagnosis Date   Anxiety    Asthma    Bipolar 1 disorder, manic, mild (HCC)    Depression    Environmental and seasonal allergies    Heart murmur    History of gestational hypertension    PTSD (post-traumatic stress disorder)     Past Surgical History:  Procedure Laterality Date   ADENOIDECTOMY     CESAREAN SECTION N/A 01/02/2023    Procedure: CESAREAN SECTION;  Surgeon: Lazaro Arms, MD;  Location: [facility] LD ORS;  Service: Obstetrics;  Laterality: N/A;   FOOT SURGERY Right 2015   TONSILECTOMY/ADENOIDECTOMY WITH MYRINGOTOMY      Family History  Problem Relation Age of Onset   Celiac disease Mother    Alcoholism Father    Heart Problems Father    Bipolar disorder Brother    Depression Brother    Anxiety disorder Brother    Diabetes Maternal Grandmother    Celiac disease Maternal Grandmother     Social History   Socioeconomic History   Marital status: Significant Other    Spouse name: Not on file   Number of children: Not on file   Years of education: Not on file   Highest education level: Not on file  Occupational History   Not on file  Tobacco Use   Smoking status: Former    Current packs/day: 0.00    Types: Cigarettes    Quit date: 12/06/2020    Years since quitting: 2.5   Smokeless tobacco: Never   Tobacco comments:    3 per day   Vaping Use   Vaping status: Never Used  Substance and Sexual Activity   Alcohol use: Not Currently    Alcohol/week: 1.0 standard drink of alcohol    Types: 1 Glasses of wine per week    Comment: not since confirmed pregnancy   Drug use: Not Currently    Comment: CBD gummy, not  since confirmed pregnancy   Sexual activity: Not Currently    Partners: Male    Birth control/protection: None  Other Topics Concern   Not on file  Social History Narrative   Not on file   Social Determinants of Health   Financial Resource Strain: Not on file  Food Insecurity: No Food Insecurity (05/16/2023)   Hunger Vital Sign    Worried About Running Out of Food in the Last Year: Never true    Ran Out of Food in the Last Year: Never true  Transportation Needs: No Transportation Needs (05/16/2023)   PRAPARE - Administrator, Civil Service (Medical): No    Lack of Transportation (Non-Medical): No  Physical Activity: Not on file  Stress: Not on file  Social Connections:  Moderately Isolated (07/05/2023)   Social Connection and Isolation Panel [NHANES]    Frequency of Communication with Friends and Family: Once a week    Frequency of Social Gatherings with Friends and Family: Once a week    Attends Religious Services: Never    Database administrator or Organizations: Yes    Attends Banker Meetings: 1 to 4 times per year    Marital Status: Living with partner  Intimate Partner Violence: Not At Risk (05/16/2023)   Humiliation, Afraid, Rape, and Kick questionnaire    Fear of Current or Ex-Partner: No    Emotionally Abused: No    Physically Abused: No    Sexually Abused: No    Review of Systems  Respiratory:  Negative for cough, shortness of breath and wheezing.   Psychiatric/Behavioral:  The patient is nervous/anxious.         Objective    There were no vitals taken for this visit.  Physical Exam HENT:     Head: Normocephalic and atraumatic.     Mouth/Throat:     Mouth: Mucous membranes are moist.  Eyes:     Extraocular Movements: Extraocular movements intact.     Pupils: Pupils are equal, round, and reactive to light.  Cardiovascular:     Rate and Rhythm: Normal rate and regular rhythm.     Pulses: Normal pulses.     Heart sounds: Normal heart sounds.  Pulmonary:     Effort: Pulmonary effort is normal.     Breath sounds: Normal breath sounds. No wheezing.  Abdominal:     General: Bowel sounds are normal.     Palpations: Abdomen is soft.  Skin:    General: Skin is warm and dry.  Neurological:     General: No focal deficit present.     Mental Status: She is alert.  Psychiatric:        Mood and Affect: Mood normal.        Behavior: Behavior normal.       Assessment & Plan:   Problem List Items Addressed This Visit   None   No follow-ups on file.  Patient seen with Dr. Lafonda Mosses.   Brandy Salim Colbert Coyer, MD

## 2023-07-05 NOTE — Patient Instructions (Addendum)
Thank you, [name] for allowing [place] to provide your care today. Today we discussed establishing care, including your asthma and anxiety.    I have ordered the following labs for you:  Lab Orders  No laboratory test(s) ordered today     Tests ordered today:  None  Referrals ordered today:   Referral Orders  No referral(s) requested today     I have ordered the following medication/changed the following medications:   Stop the following medications: There are no discontinued medications.   Start the following medications: No orders of the defined types were placed in this encounter.    Follow up: 1-2 months    Remember:   - Please remember that hydroxyzine and Zoloft can have sedating effects. If you are having adverse side effects please call our office so we can discuss.   - Please fill out your [facility] application, let us know if you have any difficulties with the paperwork.   - Please continue to use your albuterol inhaler as needed, and use your Dulera inhaler once daily.   - Medications ordered today: Hydroxyzine 10 mg up to 3 times a day as needed, Zoloft 25 mg daily, Dulera inhaler once daily, sent to [facility] at [address]  [phone].  Should you have any questions or concerns please call the internal medicine clinic at [phone].     Brandy Lambson Colbert Coyer, MD PGY-1 Internal Medicine Teaching Progam [facility]

## 2023-07-05 NOTE — Progress Notes (Unsigned)
New Patient Office Visit  Subjective    Patient ID: Brandy Ruiz, female    DOB: [birth year]  Age: 32 y.o. MRN: [id number]  CC:  Chief Complaint  Patient presents with   Establish Care   Asthma   Medication Refill    HPI Brandy Ruiz presents to establish care ***  Outpatient Encounter Medications as of 07/05/2023  Medication Sig   albuterol (PROVENTIL) (2.5 MG/3ML) 0.083% nebulizer solution Take 3 mLs (2.5 mg total) by nebulization every 6 (six) hours as needed for wheezing or shortness of breath.   albuterol (VENTOLIN HFA) 108 (90 Base) MCG/ACT inhaler Inhale 2 puffs into the lungs every 6 (six) hours as needed for wheezing or shortness of breath.   guaiFENesin (MUCINEX) 600 MG 12 hr tablet Take 1 tablet (600 mg total) by mouth 2 (two) times daily.   mometasone-formoterol (DULERA) 100-5 MCG/ACT AERO For the first week take once daily, afterwards use as needed.   Phenylephrine-APAP-guaiFENesin 5-325-200 MG CAPS Take 2 tablets by mouth daily as needed (congestion).   predniSONE (DELTASONE) 20 MG tablet Take 2 tablets (40 mg total) by mouth daily with breakfast.   No facility-administered encounter medications on file as of 07/05/2023.    Past Medical History:  Diagnosis Date   Anxiety    Asthma    Bipolar 1 disorder, manic, mild (HCC)    Depression    Environmental and seasonal allergies    Heart murmur    History of gestational hypertension    PTSD (post-traumatic stress disorder)     Past Surgical History:  Procedure Laterality Date   ADENOIDECTOMY     CESAREAN SECTION N/A 01/02/2023   Procedure: CESAREAN SECTION;  Surgeon: Brandy Arms, MD;  Location: [facility] LD [facility]S;  Service: Obstetrics;  Laterality: N/A;   FOOT SURGERY Right 2015   TONSILECTOMY/ADENOIDECTOMY WITH MYRINGOTOMY      Family History  Problem Relation Age of Onset   Celiac disease Mother    Alcoholism Father    Heart Problems Father    Bipolar disorder Brother    Depression Brother     Anxiety disorder Brother    Diabetes Maternal Grandmother    Celiac disease Maternal Grandmother     Social History   Socioeconomic History   Marital status: Significant Other    Spouse name: Not on file   Number of children: Not on file   Years of education: Not on file   Highest education level: Not on file  Occupational History   Not on file  Tobacco Use   Smoking status: Former    Current packs/day: 0.00    Types: Cigarettes    Quit date: 12/06/2020    Years since quitting: 2.5   Smokeless tobacco: Never   Tobacco comments:    3 per day   Vaping Use   Vaping status: Never Used  Substance and Sexual Activity   Alcohol use: Not Currently    Alcohol/week: 1.0 standard drink of alcohol    Types: 1 Glasses of wine per week    Comment: not since confirmed pregnancy   Drug use: Not Currently    Comment: CBD gummy, not since confirmed pregnancy   Sexual activity: Not Currently    Partners: Male    Birth control/protection: None  Other Topics Concern   Not on file  Social History Narrative   Not on file   Social Determinants of Health   Financial Resource Strain: Not on file  Food Insecurity: No Food Insecurity (05/16/2023)  Hunger Vital Sign    Worried About Running Out of Food in the Last Year: Never true    Ran Out of Food in the Last Year: Never true  Transportation Needs: No Transportation Needs (05/16/2023)   PRAPARE - Administrator, Civil Service (Medical): No    Lack of Transportation (Non-Medical): No  Physical Activity: Not on file  Stress: Not on file  Social Connections: Not on file  Intimate Partner Violence: Not At Risk (05/16/2023)   Humiliation, Afraid, Rape, and Kick questionnaire    Fear of Current or Ex-Partner: No    Emotionally Abused: No    Physically Abused: No    Sexually Abused: No    ROS      Objective    Pulse 89   Temp 98.6 F (37 C) (Oral)   Resp (!) 24   Ht 5\' 5"  (1.651 m)   Wt 262 lb 9.6 oz (119.1 kg)   SpO2  97% Comment: room air  BMI 43.70 kg/m   Physical Exam  {Labs (Optional):23779}    Assessment & Plan:   Problem List Items Addressed This Visit   None   No follow-ups on file.   Brandy Lazenby Colbert Coyer, MD

## 2023-07-06 DIAGNOSIS — F419 Anxiety disorder, unspecified: Secondary | ICD-10-CM | POA: Insufficient documentation

## 2023-07-06 DIAGNOSIS — Z0000 Encounter for general adult medical examination without abnormal findings: Secondary | ICD-10-CM | POA: Insufficient documentation

## 2023-07-06 DIAGNOSIS — Z720 Tobacco use: Secondary | ICD-10-CM | POA: Insufficient documentation

## 2023-07-06 NOTE — Addendum Note (Signed)
Addended by: Derrek Monaco on: 07/06/2023 03:06 PM   Modules accepted: Orders

## 2023-07-06 NOTE — Assessment & Plan Note (Signed)
Patient still smoking about 3 cigarettes a day. States she is trying to quit but smoking helps to relieve her anxiety. Discussed some options to help quit smoking, patient will try to cut down on her own for now. Will also be starting treatments for anxiety that will help support her efforts. Will continue to monitor at next visit in 1-2 months.

## 2023-07-06 NOTE — Progress Notes (Signed)
Internal Medicine Clinic Attending  I was physically present during the key portions of the resident provided service and participated in the medical decision making of patient's management care. I reviewed pertinent patient test results.  The assessment, diagnosis, and plan were formulated together and I agree with the documentation in the resident's note.  Mercie Eon, MD     [facility] closed our referral for this uninsured patient.  I have placed a referral to Christen Butter, our clinic's LCSW

## 2023-07-06 NOTE — Assessment & Plan Note (Signed)
Patient states she has a history of anxiety, depression, ADHD, and was told at one time she has bipolar disorder. Today she feels like her anxiety is the most debilitating for her. She feels like her job increases her anxiety, and often leads to her smoking during breaks as a calming strategy. Discussed the use of SSRI combined with integrated behavioral health to help manage her anxiety, patient interested in starting SSRI and a referral today. Will also provide PRN hydroxyzine/atarax for anxiety as her SSRI takes effect. Discussed medication effects, including sedation. Patient not currently breastfeeding.  - START sertraline/Zoloft 25 mg daily (on IM program) - Referral to [facility] care management for anxiety/talk therapy/resources - START hydroxyzine/atarax 10 mg TID as needed

## 2023-07-06 NOTE — Assessment & Plan Note (Signed)
Patient received [employer] application today, will follow up at next visit. Will defer lab work until she is able to get some coverage. Had CBC, CMP, BMP from recent hospitalization. Consider Hgb A1c, TSH, CMP, CBC at next visit. Also discuss OBGYN referral given patient's last pap smear may have been 3 years ago and she has an IUD in place with no current OBGYN follow up.

## 2023-07-06 NOTE — Assessment & Plan Note (Signed)
Patient has been doing well with her albuterol inhaler as needed. She completed her prednisone course after discharge and has stopped taking mucinex and phenylephrine-APAP-guaifenesin pills PRN due to symptom improvement. Denies SOB, wheezing, or cough. Lungs clear to auscultation on exam today. Patient currently not insured, given [employer]. Dulera (ICS-LABA) inhaler no longer available on IM program list. Will prescribe Breo Ellipta (ICS-LABA) inhaler for daily use. Also discussed patient's current tobacco use and how it can be a trigger for her asthma. Patient will continue to work on decreasing daily cigarette use.  - START Breo Ellipta inhaler, daily use

## 2023-07-07 IMAGING — US US MFM OB DETAIL+14 WK
1 series · 12 of 28 positions shown · non-contrast
Comparison: none

[Series 1: us mfm ob detail+14 wk · 12 of 102 slices shown]
[im 4/102]
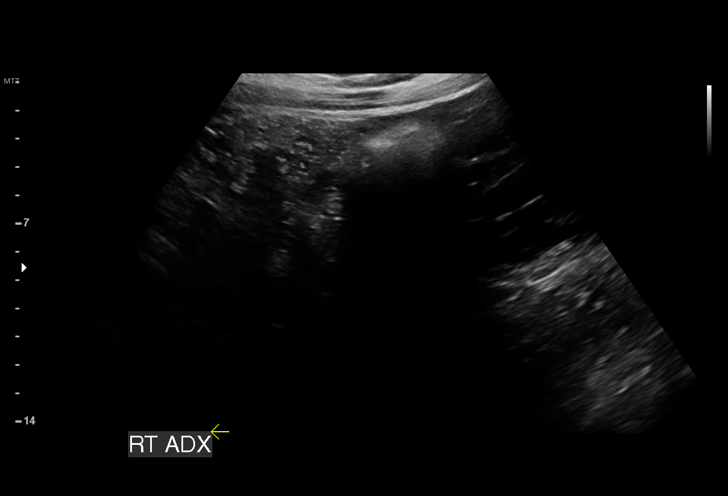
[im 12/102]
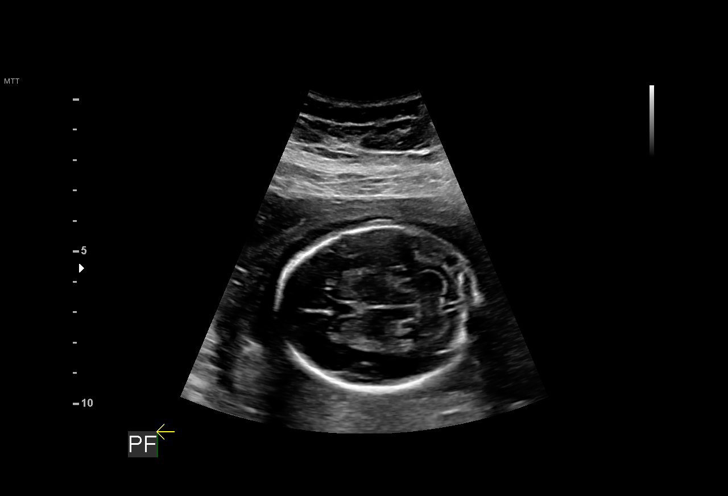
[im 19/102]
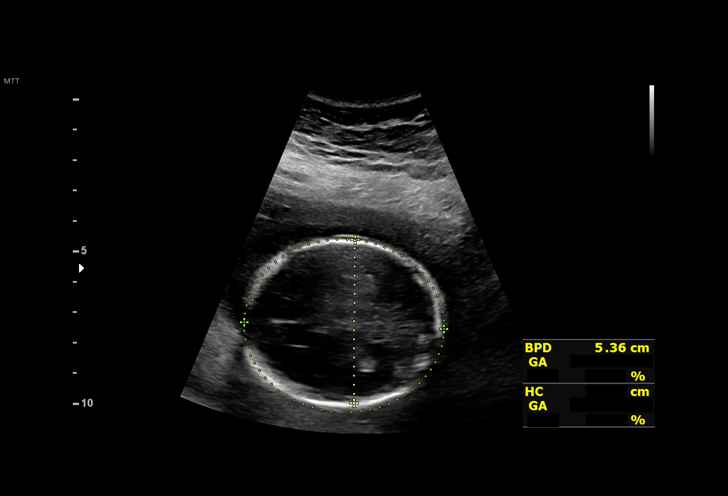
[im 30/102]
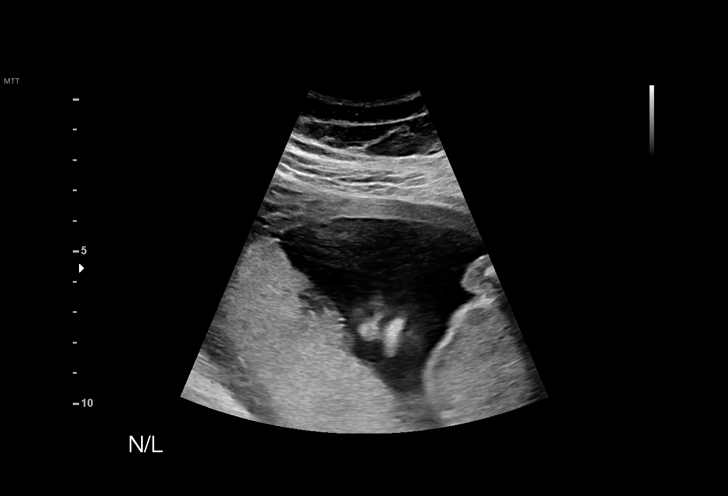
[im 38/102]
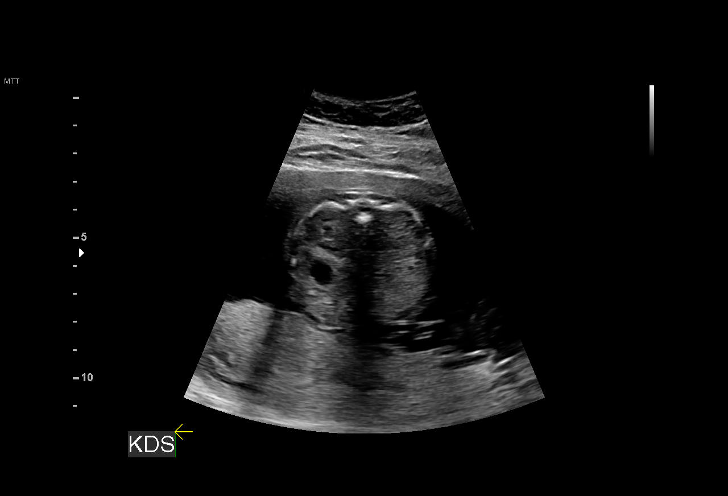
[im 45/102]
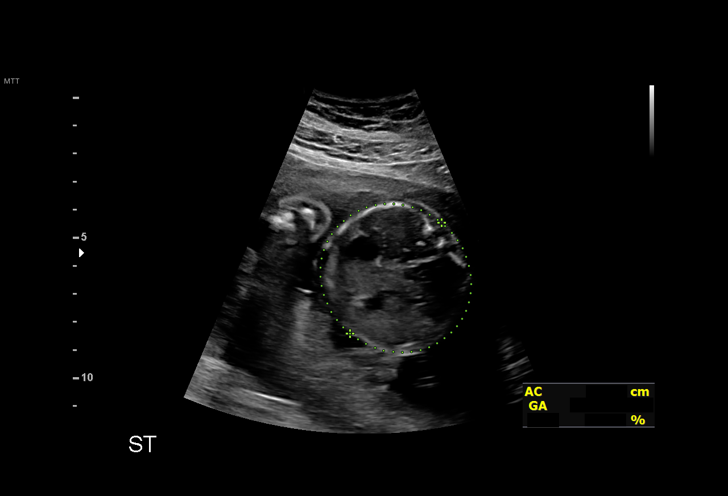
[im 57/102]
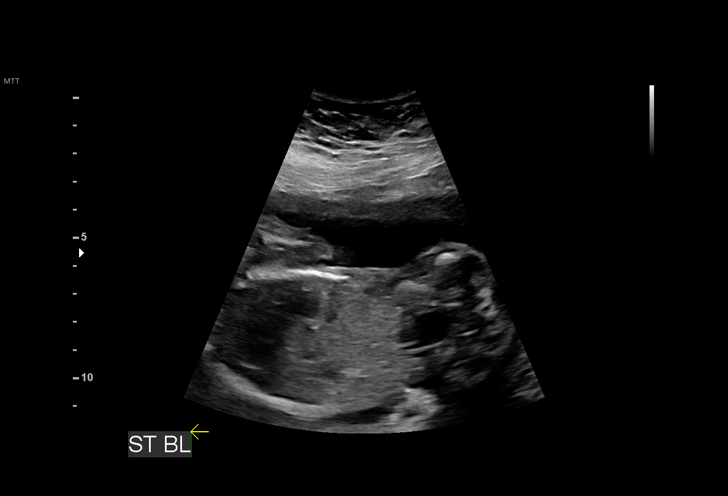
[im 64/102]
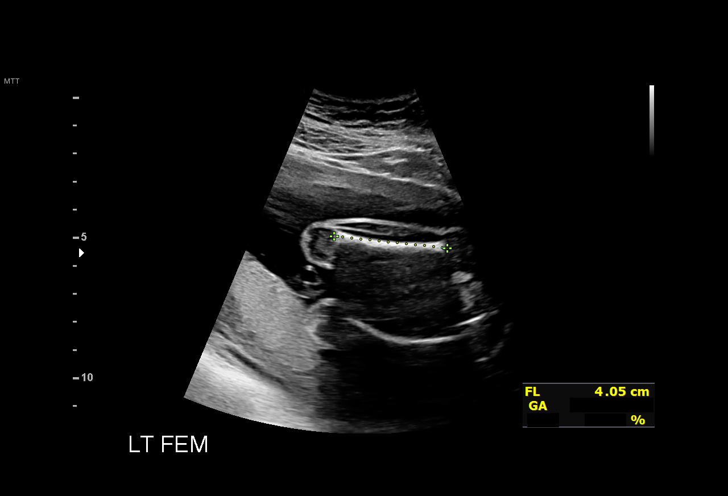
[im 72/102]
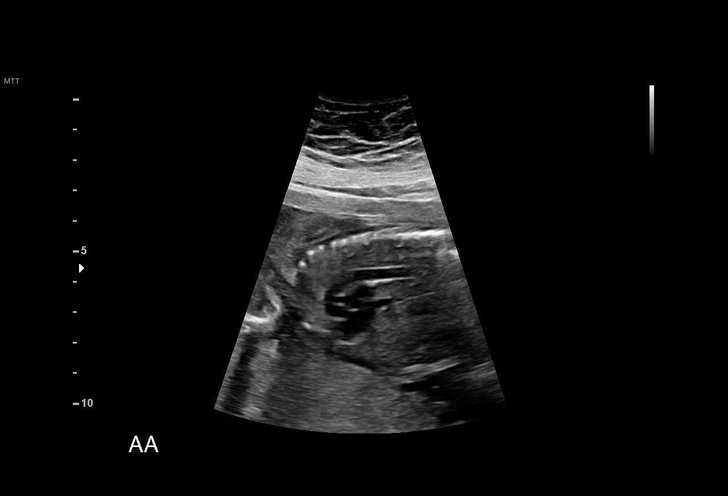
[im 83/102]
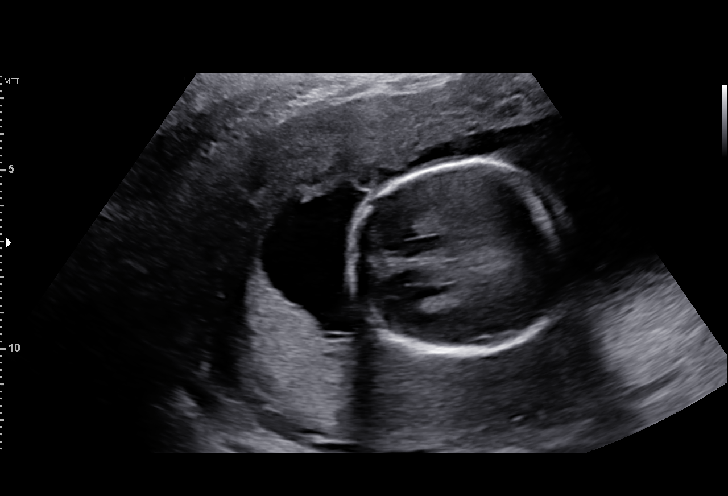
[im 90/102]
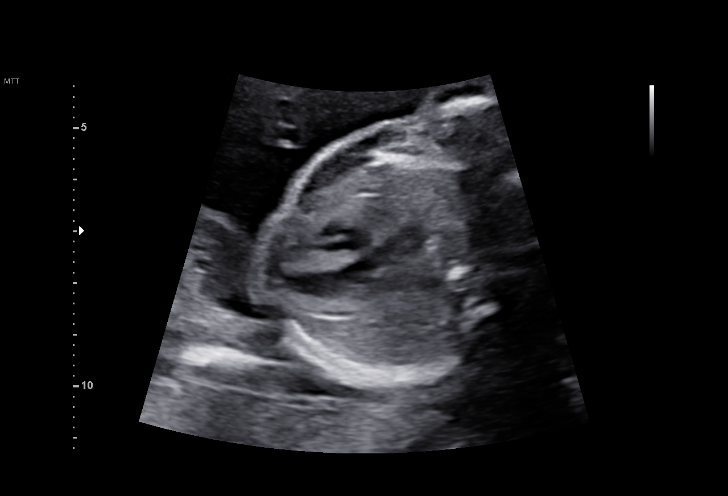
[im 98/102]
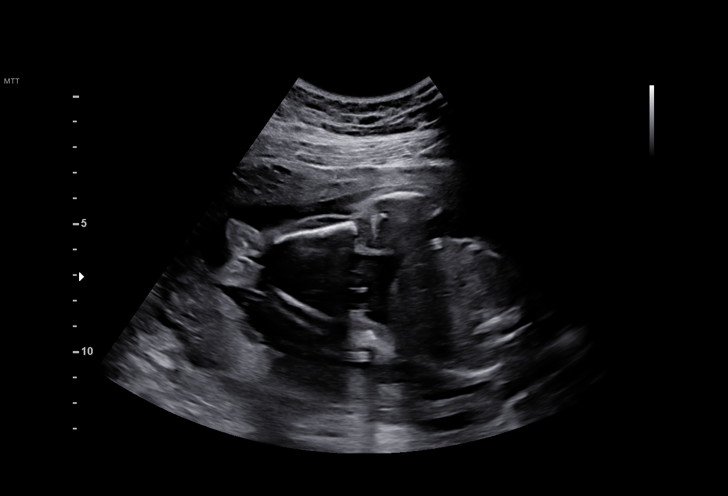

[12 of 28 positions shown; findings below may reference images not displayed]

Indications

 Obesity complicating pregnancy
 21 weeks gestation of pregnancy
 Tobacco use complicating pregnancy
 Genetic carrier (3of4)
 LR NIPS
 Neg AFP
Fetal Evaluation

 Num Of Fetuses:         1
 Preg. Location:         Intrauterine
 Fetal Heart Rate(bpm):  147
 Cardiac Activity:       Observed
 Presentation:           Breech
 Placenta:               Posterior
 P. Cord Insertion:      Marginal vs Velament insertion

 Amniotic Fluid
 AFI FV:      Within normal limits

                             Largest Pocket(cm)

Biometry

 BPD:      53.9  mm     G. Age:  22w 3d         69  %    CI:        75.57   %    70 - 86
                                                         FL/HC:      20.6   %    18.4 -
 HC:      196.6  mm     G. Age:  21w 6d         38  %    HC/AC:      1.17        1.06 -
 AC:      167.5  mm     G. Age:  21w 5d         39  %    FL/BPD:     75.1   %    71 - 87
 FL:       40.5  mm     G. Age:  23w 1d         80  %    FL/AC:      24.2   %    20 - 24
 HUM:      36.8  mm     G. Age:  22w 6d         73  %
 CER:      24.1  mm     G. Age:  22w 1d         78  %
 LV:        8.1  mm
 CM:        4.5  mm

 Est. FW:     494  gm      1 lb 1 oz     68  %
Gestational Age

 LMP:           21w 6d        Date:  11/22/20                 EDD:   08/29/21
 U/S Today:     22w 2d                                        EDD:   08/26/21
 Best:          21w 6d     Det. By:  U/S C R L  (02/17/21)    EDD:   08/29/21
Anatomy

 Cranium:               Appears normal         LVOT:                   Not well visualized
 Cavum:                 Appears normal         Aortic Arch:            Appears normal
 Ventricles:            Appears normal         Ductal Arch:            Not well visualized
 Choroid Plexus:        Appears normal         Diaphragm:              Appears normal
 Cerebellum:            Appears normal         Stomach:                Appears normal, left
                                                                       sided
 Posterior Fossa:       Appears normal         Abdomen:                Appears normal
 Nuchal Fold:           Not applicable (>20    Abdominal Wall:         Appears nml (cord
                        wks GA)                                        insert, abd wall)
 Face:                  Appears normal         Cord Vessels:           Appears normal (3
                                                                       vessel cord)
 Lips:                  Appears normal         Kidneys:                Appear normal
 Palate:                Not well visualized    Bladder:                Appears normal
 Thoracic:              Not well visualized    Spine:                  Appears normal
 Heart:                 Not well visualized    Upper Extremities:      Visualized
 RVOT:                  Not well visualized    Lower Extremities:      Visualized

 Other:  VC, 3VV and 3VTV not visualized. Normal genaitalia. Parents do not
         wish to know sex of fetus. Technically difficult due to fetal position.
Cervix Uterus Adnexa

 Cervix
 Normal appearance by transabdominal scan.

 Uterus
 No abnormality visualized.

 Right Ovary
 Not visualized.

 Left Ovary
 Not visualized.

 Cul De Sac
 No free fluid seen.

 Adnexa
 No abnormality visualized.
Comments

 Lalune Trt was seen for a detailed fetal anatomy
 scan as she has screened positive as a carrier for spinal
 muscular atrophy, alpha thalassemia, and cystic fibrosis.  Her
 blood type is O-.
 She denies any significant past medical history and denies
 any problems in her current pregnancy.
 She had a cell free DNA test earlier in her pregnancy which
 indicated a low risk for trisomy 21, 18, and 13.  The patient
 did not want the fetal gender revealed.
 She was informed that the fetal growth and amniotic fluid
 level were appropriate for her gestational age.
 There were no obvious fetal anomalies noted on today's
 ultrasound exam.  However, today's exam was limited due to
 the fetal position.
 The patient was informed that anomalies may be missed due
 to technical limitations. If the fetus is in a suboptimal position
 or maternal habitus is increased, visualization of the fetus in
 the maternal uterus may be impaired.
 The patient was advised that as spinal muscular atrophy,
 alpha thalassemia, and cystic fibrosis are autosomal
 recessive conditions, she should have the father of the baby
 (FOB) screened to determine if he is also a carrier for these
 conditions.  Should the FOB be a carrier for any of these
 conditions, there is a 25% chance that her baby will be
 affected by the disease.  The FOB stated that he is certain
 that his mother was screened for all these conditions when
 she was pregnant and was told that it was negative and
 therefore he could not be a carrier for any of these conditions.
 He declined to be screened today.
 The availability of an amniocentesis for definitive diagnosis of
 fetal aneuploidy and any of the autosomal recessive
 conditions that she is a carrier for was discussed.  The couple
 declined to have an amniocentesis today.
 The patient met with our genetic counselor to further discuss
 the implications of being a carrier for these conditions.
 As her blood type is Rh-, she was advised that she will
 require shot of RhoGAM at 28 weeks, after delivery, and at
 any time should she experience vaginal bleeding.

 A follow-up exam was scheduled in 4 weeks to assess the
 fetal growth and to complete the views of the fetal anatomy.
 The patient stated that all of their questions have been
 answered.
 A total of 30 minutes was spent counseling and coordinating
 the care for this patient.  Greater than 50% of the time was
 spent in direct face-to-face contact.

## 2023-07-12 ENCOUNTER — Other Ambulatory Visit: Payer: Self-pay | Admitting: Student

## 2023-07-12 DIAGNOSIS — F419 Anxiety disorder, unspecified: Secondary | ICD-10-CM

## 2023-07-13 ENCOUNTER — Telehealth: Payer: Self-pay

## 2023-07-13 NOTE — Telephone Encounter (Signed)
Mailed [employer] application to pt's home for [facility] assistance.

## 2023-07-15 ENCOUNTER — Other Ambulatory Visit (HOSPITAL_COMMUNITY): Payer: Self-pay

## 2023-07-22 ENCOUNTER — Ambulatory Visit (INDEPENDENT_AMBULATORY_CARE_PROVIDER_SITE_OTHER): Payer: Self-pay | Admitting: Licensed Clinical Social Worker

## 2023-07-22 DIAGNOSIS — F419 Anxiety disorder, unspecified: Secondary | ICD-10-CM

## 2023-07-22 DIAGNOSIS — F39 Unspecified mood [affective] disorder: Secondary | ICD-10-CM

## 2023-07-22 NOTE — BH Specialist Note (Signed)
Integrated Behavioral Health Initial In-Person Visit  MRN: [id number] Name: Brandy Ruiz  Number of Integrated Behavioral Health Clinician visits: 1- Initial Visit  Session Start time: 0900    Session End time: 0930  Total time in minutes: 30   Types of Service: Introduction only  Interpretor:No. Interpretor Name and Language: N/A   Warm Hand Off Completed.        Subjective: Brandy Ruiz is a 32 y.o. female  Patient was referred by PCP . Patient reports the following symptoms/concerns: Brandy Butter, LCSW-A, a Behavioral Health Clinician [facility]), conducted a clinical session with Brandy Ruiz, a 32 year old biracial female who is uninsured, unemployed, and a mother of two. During the session, Brandy Ruiz reported relocating to [place] from the [place] and experiencing challenges in obtaining her medications. She self-identified as having diagnoses of bipolar disorder, anxiety, major depression, and insomnia, and noted that she has been off her medications for the past four years. Despite previous referrals to [facility] and other agencies for a psychological evaluation, Brandy Ruiz indicated difficulties in following through with these appointments, often quitting. The [facility] clarified that she is unable to prescribe medications and emphasized the necessity for an updated psychological evaluation. To assist Brandy Ruiz, the [facility] provided information about the [facility] via email at [email or web address] and stated she would communicate with Brandy Ruiz's primary care physician (PCP) at [facility] regarding her medication concerns. Brandy Ruiz mentioned that she has recently established care with her PCP but has not yet discussed her medication issues due to having another PCP in the past. Although she declined therapy sessions, she requested assistance in obtaining medications. The [facility] recommended that [facility] follow through with referrals to either [facility]  or the [facility] for further support.  Brandy Butter, LCSW-A, a Behavioral Health Clinician [facility]), initiated the clinical session by introducing herself to the patient. During this introduction, she outlined her professional role, emphasizing her responsibilities in providing mental health support and therapeutic services. Brandy Ruiz also explained the importance of confidentiality, assuring the patient that all information shared during their sessions would be kept private and secure, in accordance with ethical guidelines and legal requirements. Brandy Ruiz provided her contact information, enabling the patient to reach out for any questions or concerns between sessions. She also detailed the range of services available, including individual therapy, crisis intervention, and support for various mental health challenges. The patient expressed understanding of these points and declined services. Duration of problem: 4 years; Severity of problem: severe  Objective: Mood: Angry and Anxious and Affect: Appropriate and Blunt Risk of harm to self or others: No plan to harm self or others   Patient and/or Family's Strengths/Protective Factors: Sense of purpose   Progress towards Goals: Other Patient declined services Interventions: Interventions utilized: Patient declined  Standardized Assessments completed: Patient declined screening   Assessment: Patient currently experiencing Mood instability from being off medications.   Patient may benefit from A referral to [facility] or [facility] psychological services.  Plan: Follow up with behavioral health clinician on : Patient declined services  Brandy Ruiz, MSW, LCSW-A She/Her Behavioral Health Clinician [facility]  [facility] Direct Dial:[phone]  Fax [phone] Main Office Phone: [phone] [address] [place] [place], [place] [address] Website: [facility]  [place], [place]  [place]

## 2023-09-23 ENCOUNTER — Encounter: Payer: Medicaid Other | Admitting: Student

## 2023-12-15 ENCOUNTER — Encounter (HOSPITAL_BASED_OUTPATIENT_CLINIC_OR_DEPARTMENT_OTHER): Payer: Self-pay | Admitting: Emergency Medicine

## 2023-12-15 DIAGNOSIS — M5441 Lumbago with sciatica, right side: Secondary | ICD-10-CM | POA: Insufficient documentation

## 2023-12-15 DIAGNOSIS — Z91040 Latex allergy status: Secondary | ICD-10-CM | POA: Insufficient documentation

## 2023-12-15 LAB — COMPREHENSIVE METABOLIC PANEL WITH GFR
ALT: 15 U/L (ref 0–44)
AST: 16 U/L (ref 15–41)
Albumin: 4.8 g/dL (ref 3.5–5.0)
Alkaline Phosphatase: 78 U/L (ref 38–126)
Anion gap: 9 (ref 5–15)
BUN: 13 mg/dL (ref 6–20)
CO2: 23 mmol/L (ref 22–32)
Calcium: 8.9 mg/dL (ref 8.9–10.3)
Chloride: 106 mmol/L (ref 98–111)
Creatinine, Ser: 0.71 mg/dL (ref 0.44–1.00)
GFR, Estimated: >60 mL/min (ref >60)
Glucose, Bld: 93 mg/dL (ref 70–99)
Potassium: 3.9 mmol/L (ref 3.5–5.1)
Sodium: 138 mmol/L (ref 135–145)
Total Bilirubin: 0.4 mg/dL (ref 0.0–1.2)
Total Protein: 7.7 g/dL (ref 6.5–8.1)

## 2023-12-15 LAB — CBC WITH DIFFERENTIAL/PLATELET
Abs Immature Granulocytes: 0.03 10*3/uL (ref 0.00–0.07)
Basophils Absolute: 0 10*3/uL (ref 0.0–0.1)
Basophils Relative: 0 %
Eosinophils Absolute: 0.3 10*3/uL (ref 0.0–0.5)
Eosinophils Relative: 2 %
HCT: 43.8 % (ref 36.0–46.0)
Hemoglobin: 14.6 g/dL (ref 12.0–15.0)
Immature Granulocytes: 0 %
Lymphocytes Relative: 19 %
Lymphs Abs: 2.3 10*3/uL (ref 0.7–4.0)
MCH: 28.1 pg (ref 26.0–34.0)
MCHC: 33.3 g/dL (ref 30.0–36.0)
MCV: 84.2 fL (ref 80.0–100.0)
Monocytes Absolute: 0.7 10*3/uL (ref 0.1–1.0)
Monocytes Relative: 6 %
Neutro Abs: 8.7 10*3/uL — ABNORMAL HIGH (ref 1.7–7.7)
Neutrophils Relative %: 73 %
Platelets: 242 10*3/uL (ref 150–400)
RBC: 5.2 MIL/uL — ABNORMAL HIGH (ref 3.87–5.11)
RDW: 13.5 % (ref 11.5–15.5)
WBC: 11.9 10*3/uL — ABNORMAL HIGH (ref 4.0–10.5)
nRBC: 0 % (ref 0.0–0.2)

## 2023-12-15 LAB — HCG, SERUM, QUALITATIVE: Preg, Serum: NEGATIVE

## 2023-12-15 NOTE — ED Triage Notes (Signed)
 Pt coming from home with back pain episode today. Pt has hx of back pain and routinely goes to chiropractor. Pt took tylenol and used tens unit without relief. Denies loss of bowel or bladder.

## 2023-12-16 ENCOUNTER — Emergency Department (HOSPITAL_BASED_OUTPATIENT_CLINIC_OR_DEPARTMENT_OTHER)
Admission: EM | Admit: 2023-12-16 | Discharge: 2023-12-16 | Disposition: A | Discharge status: Home / Self Care | Payer: Self-pay | Attending: Emergency Medicine | Admitting: Emergency Medicine

## 2023-12-16 DIAGNOSIS — M5441 Lumbago with sciatica, right side: Secondary | ICD-10-CM

## 2023-12-16 MED ORDER — OXYCODONE HCL 5 MG PO TABS
5.0000 mg | ORAL_TABLET | Freq: Once | ORAL | Status: AC
  Administered 2023-12-16: 5 mg via ORAL
  Filled 2023-12-16: qty 1

## 2023-12-16 MED ORDER — ACETAMINOPHEN 500 MG PO TABS
1000.0000 mg | ORAL_TABLET | Freq: Once | ORAL | Status: AC
  Administered 2023-12-16: 1000 mg via ORAL
  Filled 2023-12-16: qty 2

## 2023-12-16 MED ORDER — DIAZEPAM 5 MG PO TABS
5.0000 mg | ORAL_TABLET | Freq: Once | ORAL | Status: AC
  Administered 2023-12-16: 5 mg via ORAL
  Filled 2023-12-16: qty 1

## 2023-12-16 MED ORDER — METHOCARBAMOL 500 MG PO TABS: 500.0000 mg | ORAL_TABLET | Freq: Two times a day (BID) | ORAL | 0 refills | Status: DC

## 2023-12-16 MED ORDER — KETOROLAC TROMETHAMINE 15 MG/ML IJ SOLN
15.0000 mg | Freq: Once | INTRAMUSCULAR | Status: AC
  Administered 2023-12-16: 15 mg via INTRAMUSCULAR
  Filled 2023-12-16: qty 1

## 2023-12-16 NOTE — ED Provider Notes (Signed)
 [place] EMERGENCY DEPARTMENT AT [facility] Provider Note   CSN: [id number] Arrival date & time: 12/15/23  2248     History  Chief Complaint  Patient presents with   Back Pain    Brandy Ruiz is a 33 y.o. female.  33 yo F with a cc of low back pain.  Patient has problems with her back at baseline she said about 3 days ago she felt her back was consistent when she got up and then she bent over to pick something up and since then has had worsening of her back pain.  At times it feels so bad when she is sitting that she has trouble moving her legs.  She otherwise denies leg weakness denies loss of bowel or bladder denies loss of perirectal sensation.  She denies trauma.   Back Pain      Home Medications Prior to Admission medications   Medication Sig Start Date End Date Taking? Authorizing Provider  methocarbamol (ROBAXIN) 500 MG tablet Take 1 tablet (500 mg total) by mouth 2 (two) times daily. 12/16/23  Yes Melene Plan, DO  albuterol (PROVENTIL) (2.5 MG/3ML) 0.083% nebulizer solution Take 3 mLs (2.5 mg total) by nebulization every 6 (six) hours as needed for wheezing or shortness of breath. 08/23/22   Gailen Shelter, PA  albuterol (VENTOLIN HFA) 108 (90 Base) MCG/ACT inhaler Inhale 2 puffs into the lungs every 6 (six) hours as needed for wheezing or shortness of breath.    [provider]  fluticasone furoate-vilanterol (BREO ELLIPTA) 100-25 MCG/ACT AEPB Inhale 1 puff into the lungs daily. 07/05/23   Philomena Doheny, MD  hydrOXYzine (ATARAX) 10 MG tablet Take 1 tablet (10 mg total) by mouth 3 (three) times daily as needed. 07/05/23   Philomena Doheny, MD  sertraline (ZOLOFT) 25 MG tablet Take 1 tablet (25 mg total) by mouth daily. 07/05/23   Philomena Doheny, MD      Allergies    Shellfish allergy, Amoxicillin, Other, Penicillins, Latex, and Tape    Review of Systems   Review of Systems  Musculoskeletal:  Positive for back  pain.    Physical Exam Updated Vital Signs BP 139/85   Pulse 85   Temp 98.1 F (36.7 C) (Oral)   Resp 18   Ht 5\' 5"  (1.651 m)   Wt 117.9 kg   SpO2 99%   BMI 43.27 kg/m  Physical Exam Vitals and nursing note reviewed.  Constitutional:      General: She is not in acute distress.    Appearance: She is well-developed. She is not diaphoretic.     Comments: BMI 50  HENT:     Head: Normocephalic and atraumatic.  Eyes:     Pupils: Pupils are equal, round, and reactive to light.  Cardiovascular:     Rate and Rhythm: Normal rate and regular rhythm.     Heart sounds: No murmur heard.    No friction rub. No gallop.  Pulmonary:     Effort: Pulmonary effort is normal.     Breath sounds: No wheezing or rales.  Abdominal:     General: There is no distension.     Palpations: Abdomen is soft.     Tenderness: There is no abdominal tenderness.  Musculoskeletal:        General: No tenderness.     Cervical back: Normal range of motion and neck supple.     Comments: No obvious midline spinal tenderness step-offs or deformities.  Pulse motor and  sensation intact bilateral lower extremities.  Reflexes are 2+ and equal.  No clonus.  Skin:    General: Skin is warm and dry.  Neurological:     Mental Status: She is alert and oriented to person, place, and time.  Psychiatric:        Behavior: Behavior normal.     ED Results / Procedures / Treatments   Labs (all labs ordered are listed, but only abnormal results are displayed) Labs Reviewed  CBC WITH DIFFERENTIAL/PLATELET - Abnormal; Notable for the following components:      Result Value   WBC 11.9 (*)    RBC 5.20 (*)    Neutro Abs 8.7 (*)    All other components within normal limits  COMPREHENSIVE METABOLIC PANEL WITH GFR  HCG, SERUM, QUALITATIVE    EKG None  Radiology No results found.  Procedures Procedures    Medications Ordered in ED Medications  acetaminophen (TYLENOL) tablet 1,000 mg (1,000 mg Oral Given 12/16/23  0447)  ketorolac (TORADOL) 15 MG/ML injection 15 mg (15 mg Intramuscular Given 12/16/23 0447)  oxyCODONE (Oxy IR/ROXICODONE) immediate release tablet 5 mg (5 mg Oral Given 12/16/23 0447)  diazepam (VALIUM) tablet 5 mg (5 mg Oral Given 12/16/23 0447)    ED Course/ Medical Decision Making/ A&P                                 Medical Decision Making Amount and/or Complexity of Data Reviewed Labs: ordered.  Risk OTC drugs. Prescription drug management.   33 year old female with a chief complaints of low back pain.  She has had low back pain in the past.  Thought it got worse over the past 3 to 4 days.  So bad today that she had trouble moving her legs due to discomfort.  No red flags otherwise.  Lab work obtained to the triage process, no acute anemia, no significant electrolyte abnormalities.  Likely musculoskeletal low back pain.  Will attempt to improve symptoms here.  PCP follow-up.  Patient feeling better on repeat assessment.  5:26 AM:  I have discussed the diagnosis/risks/treatment options with the patient and family.  Evaluation and diagnostic testing in the emergency department does not suggest an emergent condition requiring admission or immediate intervention beyond what has been performed at this time.  They will follow up with PCP. We also discussed returning to the ED immediately if new or worsening sx occur. We discussed the sx which are most concerning (e.g., sudden worsening pain, fever, inability to tolerate by mouth, cauda equina s/sx) that necessitate immediate return. Medications administered to the patient during their visit and any new prescriptions provided to the patient are listed below.  Medications given during this visit Medications  acetaminophen (TYLENOL) tablet 1,000 mg (1,000 mg Oral Given 12/16/23 0447)  ketorolac (TORADOL) 15 MG/ML injection 15 mg (15 mg Intramuscular Given 12/16/23 0447)  oxyCODONE (Oxy IR/ROXICODONE) immediate release tablet 5 mg (5 mg  Oral Given 12/16/23 0447)  diazepam (VALIUM) tablet 5 mg (5 mg Oral Given 12/16/23 0447)     The patient appears reasonably screen and/or stabilized for discharge and I doubt any other medical condition or other [facility] requiring further screening, evaluation, or treatment in the ED at this time prior to discharge.          Final Clinical Impression(s) / ED Diagnoses Final diagnoses:  Acute right-sided low back pain with right-sided sciatica    Rx / DC  Orders ED Discharge Orders          Ordered    methocarbamol (ROBAXIN) 500 MG tablet  2 times daily        12/16/23 0445              Melene Plan, DO 12/16/23 [phone]

## 2023-12-16 NOTE — Discharge Instructions (Signed)

## 2024-09-03 ENCOUNTER — Other Ambulatory Visit: Payer: Self-pay

## 2024-09-03 ENCOUNTER — Encounter (HOSPITAL_BASED_OUTPATIENT_CLINIC_OR_DEPARTMENT_OTHER): Payer: Self-pay | Admitting: *Deleted

## 2024-09-03 DIAGNOSIS — Z91040 Latex allergy status: Secondary | ICD-10-CM | POA: Insufficient documentation

## 2024-09-03 DIAGNOSIS — J40 Bronchitis, not specified as acute or chronic: Secondary | ICD-10-CM | POA: Insufficient documentation

## 2024-09-03 NOTE — ED Notes (Signed)
 No respiratory swab done in triage due to shortage.

## 2024-09-03 NOTE — ED Triage Notes (Signed)
 Pt has been sick with URI and cough and states that she has needed to use her albuterol  inhaler more and she is now out.  Pt coughing in triage, offered pt a mask.  RT listened to pt in triage.

## 2024-09-04 ENCOUNTER — Emergency Department (HOSPITAL_BASED_OUTPATIENT_CLINIC_OR_DEPARTMENT_OTHER)
Admission: EM | Admit: 2024-09-04 | Discharge: 2024-09-04 | Disposition: A | Payer: Self-pay | Attending: Emergency Medicine | Admitting: Emergency Medicine

## 2024-09-04 ENCOUNTER — Emergency Department (HOSPITAL_BASED_OUTPATIENT_CLINIC_OR_DEPARTMENT_OTHER): Payer: Self-pay | Admitting: Radiology

## 2024-09-04 DIAGNOSIS — J40 Bronchitis, not specified as acute or chronic: Secondary | ICD-10-CM

## 2024-09-04 MED ORDER — ALBUTEROL SULFATE (2.5 MG/3ML) 0.083% IN NEBU: 2.5000 mg | INHALATION_SOLUTION | Freq: Four times a day (QID) | RESPIRATORY_TRACT | 12 refills | Status: AC | PRN

## 2024-09-04 MED ORDER — PREDNISONE 20 MG PO TABS: ORAL_TABLET | ORAL | 0 refills | Status: DC

## 2024-09-04 MED ORDER — ALBUTEROL SULFATE HFA 108 (90 BASE) MCG/ACT IN AERS
2.0000 | INHALATION_SPRAY | RESPIRATORY_TRACT | Status: DC | PRN
  Administered 2024-09-04: 2 via RESPIRATORY_TRACT
  Filled 2024-09-04: qty 6.7

## 2024-09-04 MED ORDER — IPRATROPIUM-ALBUTEROL 0.5-2.5 (3) MG/3ML IN SOLN
3.0000 mL | RESPIRATORY_TRACT | Status: AC
  Administered 2024-09-04: 9 mL via RESPIRATORY_TRACT
  Filled 2024-09-04: qty 9

## 2024-09-04 MED ORDER — PREDNISONE 50 MG PO TABS
60.0000 mg | ORAL_TABLET | Freq: Once | ORAL | Status: AC
  Administered 2024-09-04: 60 mg via ORAL
  Filled 2024-09-04: qty 1

## 2024-09-04 NOTE — ED Provider Notes (Signed)
 " [place] EMERGENCY DEPARTMENT AT [facility] Provider Note   CSN: [id number] Arrival date & time: 09/03/24  2324     Patient presents with: Asthma and URI   Brandy Ruiz is a 33 y.o. female.   33 year old female presents ER today with cough and shortness of breath.  States that her 2 children were sick about a week and a half ago and then got better a few days ago.  The patient's been sick for about a week with significant cough, wheezing and shortness of breath.  Patient states that she is out of her nebulizer now because she has been using it so much over the last 7 days.  Her inhaler is not working.  States she has a bit of pressure in her chest.  These are similar to previous asthma exacerbations.  Denies any fever.  She does have some mucus in her cough but it is clear/white.   Asthma  URI      Prior to Admission medications  Medication Sig Start Date End Date Taking? Authorizing Provider  predniSONE  (DELTASONE ) 20 MG tablet 2 tabs po daily x 4 days 09/04/24  Yes Nils Thor, Selinda, MD  albuterol  (PROVENTIL ) (2.5 MG/3ML) 0.083% nebulizer solution Take 3 mLs (2.5 mg total) by nebulization every 6 (six) hours as needed for wheezing or shortness of breath. 09/04/24   Arlethia Basso, Selinda, MD  albuterol  (VENTOLIN  HFA) 108 (90 Base) MCG/ACT inhaler Inhale 2 puffs into the lungs every 6 (six) hours as needed for wheezing or shortness of breath.    [provider]  fluticasone  furoate-vilanterol (BREO ELLIPTA ) 100-25 MCG/ACT AEPB Inhale 1 puff into the lungs daily. 07/05/23   Arellano Zameza, Priscila, MD  hydrOXYzine  (ATARAX ) 10 MG tablet Take 1 tablet (10 mg total) by mouth 3 (three) times daily as needed. 07/05/23   Arellano Zameza, Priscila, MD  methocarbamol  (ROBAXIN ) 500 MG tablet Take 1 tablet (500 mg total) by mouth 2 (two) times daily. 12/16/23   Emil Share, DO  sertraline  (ZOLOFT ) 25 MG tablet Take 1 tablet (25 mg total) by mouth daily. 07/05/23   Arellano  Zameza, Priscila, MD    Allergies: Shellfish allergy, Amoxicillin, Other, Penicillins, Latex, and Tape    Review of Systems  Updated Vital Signs BP (!) 145/69 (BP Location: Right Arm)   Pulse 96   Temp 98.3 F (36.8 C)   Resp 20   SpO2 99%   Physical Exam Vitals and nursing note reviewed.  Constitutional:      Appearance: She is well-developed.  HENT:     Head: Normocephalic and atraumatic.  Cardiovascular:     Rate and Rhythm: Normal rate and regular rhythm.  Pulmonary:     Effort: No respiratory distress.     Breath sounds: No stridor. Wheezing present.  Abdominal:     General: There is no distension.  Musculoskeletal:        General: No swelling or tenderness. Normal range of motion.     Cervical back: Normal range of motion.  Skin:    General: Skin is warm and dry.  Neurological:     General: No focal deficit present.     Mental Status: She is alert.     (all labs ordered are listed, but only abnormal results are displayed) Labs Reviewed - No data to display  EKG: None  Radiology: DG Chest 2 View Result Date: 09/04/2024 EXAM: 2 VIEW(S) XRAY OF THE CHEST 09/04/2024 05:16:00 AM COMPARISON: CTA chest 05/15/2023, and earlier. CLINICAL HISTORY: 33 year old  female with upper respiratory infection, cough, albuterol  depletion. And history of smoking. FINDINGS: LUNGS AND PLEURA: Lung volumes are stable and within normal limits. No confluent lung opacity. Coarse bilateral pulmonary interstitial markings, symmetric, and also appear stable to chest radiograph last year. These might be smoking related. No pleural effusion. No pneumothorax. HEART AND MEDIASTINUM: The cardiac and mediastinal silhouettes are stable and within normal limits. Negative visible trachea. BONES AND SOFT TISSUES: No acute osseous abnormality. Negative visible bowel gas. IMPRESSION: 1. Coarse interstitial markings appear chronic and stable. 2. No acute cardiopulmonary abnormality. Electronically signed  by: Helayne Hurst MD 09/04/2024 05:21 AM EST RP Workstation: [id number]     Procedures   Medications Ordered in the ED  albuterol  (VENTOLIN  HFA) 108 (90 Base) MCG/ACT inhaler 2 puff (2 puffs Inhalation Given 09/04/24 0450)  ipratropium-albuterol  (DUONEB) 0.5-2.5 (3) MG/3ML nebulizer solution 3 mL (3 mLs Nebulization Not Given 09/04/24 0520)  predniSONE  (DELTASONE ) tablet 60 mg (60 mg Oral Given 09/04/24 0520)                                    Medical Decision Making Amount and/or Complexity of Data Reviewed Radiology: ordered.  Risk Prescription drug management.   Likely just asthma exacerbation probably related to a viral illness.  X-ray is reassuring no fever no mucus concerning for bacterial infection.  Had multiple DuoNebs here and steroids and her chest pressure is improved significantly her breathing is improved still coughing a little bit.  Will continue steroids at home giving her an inhaler here and refilled her nebulized albuterol  at home as well.  Follow-up with PCP 2 to 3 days if not improving return here if anything worsens.  Final diagnoses:  Bronchitis    ED Discharge Orders          Ordered    predniSONE  (DELTASONE ) 20 MG tablet        09/04/24 0635    albuterol  (PROVENTIL ) (2.5 MG/3ML) 0.083% nebulizer solution  Every 6 hours PRN        09/04/24 0635               Arnett Duddy, Selinda, MD 09/04/24 [phone]  "

## 2024-09-07 ENCOUNTER — Other Ambulatory Visit: Payer: Self-pay

## 2024-09-07 ENCOUNTER — Encounter (HOSPITAL_BASED_OUTPATIENT_CLINIC_OR_DEPARTMENT_OTHER): Payer: Self-pay | Admitting: *Deleted

## 2024-09-07 ENCOUNTER — Emergency Department (HOSPITAL_BASED_OUTPATIENT_CLINIC_OR_DEPARTMENT_OTHER)
Admission: EM | Admit: 2024-09-07 | Discharge: 2024-09-07 | Disposition: A | Discharge status: Home / Self Care | Payer: Self-pay | Attending: Emergency Medicine | Admitting: Emergency Medicine

## 2024-09-07 DIAGNOSIS — J45909 Unspecified asthma, uncomplicated: Secondary | ICD-10-CM | POA: Insufficient documentation

## 2024-09-07 DIAGNOSIS — R0602 Shortness of breath: Secondary | ICD-10-CM | POA: Insufficient documentation

## 2024-09-07 DIAGNOSIS — L03311 Cellulitis of abdominal wall: Secondary | ICD-10-CM

## 2024-09-07 DIAGNOSIS — R112 Nausea with vomiting, unspecified: Secondary | ICD-10-CM | POA: Insufficient documentation

## 2024-09-07 DIAGNOSIS — R197 Diarrhea, unspecified: Secondary | ICD-10-CM | POA: Insufficient documentation

## 2024-09-07 DIAGNOSIS — R509 Fever, unspecified: Secondary | ICD-10-CM | POA: Insufficient documentation

## 2024-09-07 DIAGNOSIS — Z7951 Long term (current) use of inhaled steroids: Secondary | ICD-10-CM | POA: Insufficient documentation

## 2024-09-07 DIAGNOSIS — Z91040 Latex allergy status: Secondary | ICD-10-CM | POA: Insufficient documentation

## 2024-09-07 MED ORDER — DOXYCYCLINE HYCLATE 100 MG PO TABS
100.0000 mg | ORAL_TABLET | Freq: Once | ORAL | Status: AC
  Administered 2024-09-07: 100 mg via ORAL
  Filled 2024-09-07: qty 1

## 2024-09-07 MED ORDER — DOXYCYCLINE HYCLATE 100 MG PO CAPS: 100.0000 mg | ORAL_CAPSULE | Freq: Two times a day (BID) | ORAL | 0 refills | Status: DC

## 2024-09-07 MED ORDER — DOXYCYCLINE HYCLATE 100 MG PO CAPS: 100.0000 mg | ORAL_CAPSULE | Freq: Two times a day (BID) | ORAL | 0 refills | Status: AC

## 2024-09-07 NOTE — ED Triage Notes (Addendum)
 Pt c/o of fever that started yesterday. Today fever was 101 at 12pm today. Was seen here on 12-28 for an URI. Concerned that an old c-section wound from 18 months ago is opening up small area less than an inch superficial open area noted. Mild Redness noted to the c-section line. C/o of feeling a little sob from her asthma, but states she does not feel like she needs an inhaler right now.

## 2024-09-07 NOTE — ED Provider Notes (Signed)
" °  [place] [facility] AT [facility] Provider Note   CSN: [id number] Arrival date & time: 09/07/24  9947     Patient presents with: Fever   Brandy Ruiz is a 34 y.o. female.   Patient is a 34 year old female presenting with concerns about her C-section scar.  She noticed drainage on her clothing and her husband says that it looked like the scar was opening up.  Her C-section was 18 months ago and has not had a problem since.  She does describe some nausea, vomiting, and diarrhea over the past few days, but denies to me she is having any abdominal pain.       Prior to Admission medications  Medication Sig Start Date End Date Taking? Authorizing Provider  albuterol  (PROVENTIL ) (2.5 MG/3ML) 0.083% nebulizer solution Take 3 mLs (2.5 mg total) by nebulization every 6 (six) hours as needed for wheezing or shortness of breath. 09/04/24   Mesner, Selinda, MD  albuterol  (VENTOLIN  HFA) 108 (90 Base) MCG/ACT inhaler Inhale 2 puffs into the lungs every 6 (six) hours as needed for wheezing or shortness of breath.    [provider]  fluticasone  furoate-vilanterol (BREO ELLIPTA ) 100-25 MCG/ACT AEPB Inhale 1 puff into the lungs daily. 07/05/23   Arellano Zameza, Priscila, MD    Allergies: Shellfish allergy, Amoxicillin, Other, Penicillins, Latex, and Tape    Review of Systems  All other systems reviewed and are negative.   Updated Vital Signs BP 118/80   Pulse (!) 103   Temp 98.8 F (37.1 C) (Temporal)   Resp 20   Ht 5' 5 (1.651 m)   Wt 127 kg   SpO2 99%   BMI 46.59 kg/m   Physical Exam Vitals and nursing note reviewed.  Constitutional:      Appearance: Normal appearance.  HENT:     Head: Normocephalic.  Pulmonary:     Effort: Pulmonary effort is normal.  Abdominal:     General: Abdomen is flat. There is no distension.     Tenderness: There is no abdominal tenderness. There is no guarding or rebound.     Comments: The C-section scar appears to  be healing well.  Just below the distal left end of the incision is a small area of open skin.  There is some surrounding erythema.  There is no purulent drainage and no fluctuance.  Skin:    General: Skin is warm and dry.  Neurological:     Mental Status: She is alert and oriented to person, place, and time.     (all labs ordered are listed, but only abnormal results are displayed) Labs Reviewed - No data to display  EKG: None  Radiology: No results found.   Procedures   Medications Ordered in the ED - No data to display                                  Medical Decision Making  Patient with possible cellulitis in the area of her previous C-section scar.  She will be treated with doxycycline and local wound care.  To follow-up as needed.  Her abdomen is benign and I do not feel as though any laboratory studies or imaging is indicated.     Final diagnoses:  None    ED Discharge Orders     None          Geroldine Berg, MD 09/07/24 0209  "

## 2024-09-07 NOTE — Discharge Instructions (Signed)
 Begin taking doxycycline as prescribed.  Keep area clean and dry.  Apply bacitracin twice daily.
